# Patient Record
Sex: Female | Born: 1940 | Race: White | Hispanic: No | Smoking: Current every day smoker
Health system: Southern US, Community
[De-identification: ages and names within clinical notes are randomized; demographics above are authoritative.]

## PROBLEM LIST (undated history)

## (undated) DIAGNOSIS — N183 Chronic kidney disease, stage 3 unspecified: Secondary | ICD-10-CM

## (undated) DIAGNOSIS — Z720 Tobacco use: Secondary | ICD-10-CM

## (undated) DIAGNOSIS — M1990 Unspecified osteoarthritis, unspecified site: Secondary | ICD-10-CM

## (undated) DIAGNOSIS — R001 Bradycardia, unspecified: Secondary | ICD-10-CM

## (undated) DIAGNOSIS — Z8659 Personal history of other mental and behavioral disorders: Secondary | ICD-10-CM

## (undated) DIAGNOSIS — F0150 Vascular dementia without behavioral disturbance: Secondary | ICD-10-CM

## (undated) DIAGNOSIS — Z9889 Other specified postprocedural states: Secondary | ICD-10-CM

## (undated) DIAGNOSIS — I639 Cerebral infarction, unspecified: Secondary | ICD-10-CM

## (undated) DIAGNOSIS — I10 Essential (primary) hypertension: Secondary | ICD-10-CM

## (undated) HISTORY — PX: APPENDECTOMY: SHX54

## (undated) HISTORY — DX: Cerebral infarction, unspecified: I63.9

## (undated) HISTORY — DX: Essential (primary) hypertension: I10

## (undated) HISTORY — DX: Tobacco use: Z72.0

## (undated) HISTORY — DX: Bradycardia, unspecified: R00.1

## (undated) HISTORY — DX: Personal history of other mental and behavioral disorders: Z86.59

## (undated) HISTORY — DX: Vascular dementia, unspecified severity, without behavioral disturbance, psychotic disturbance, mood disturbance, and anxiety: F01.50

## (undated) HISTORY — PX: TONSILLECTOMY: SUR1361

## (undated) HISTORY — PX: ABDOMINAL HYSTERECTOMY: SHX81

## (undated) HISTORY — DX: Other specified postprocedural states: Z98.890

---

## 1993-09-11 DIAGNOSIS — I639 Cerebral infarction, unspecified: Secondary | ICD-10-CM

## 1993-09-11 HISTORY — DX: Cerebral infarction, unspecified: I63.9

## 1999-09-12 DIAGNOSIS — Z9889 Other specified postprocedural states: Secondary | ICD-10-CM

## 1999-09-12 HISTORY — DX: Other specified postprocedural states: Z98.890

## 2007-09-11 ENCOUNTER — Ambulatory Visit: Payer: Self-pay | Admitting: Family Medicine

## 2007-11-04 ENCOUNTER — Emergency Department: Payer: Self-pay | Admitting: Emergency Medicine

## 2012-07-31 ENCOUNTER — Observation Stay: Payer: Self-pay | Admitting: Internal Medicine

## 2012-07-31 LAB — PROTIME-INR
INR: 0.9
Prothrombin Time: 12.5 secs (ref 11.5–14.7)

## 2012-07-31 LAB — URINALYSIS, COMPLETE
Bilirubin,UR: NEGATIVE
Blood: NEGATIVE
Glucose,UR: NEGATIVE mg/dL (ref 0–75)
Nitrite: NEGATIVE
RBC,UR: 4 /HPF (ref 0–5)
Squamous Epithelial: 1
WBC UR: 1 /HPF (ref 0–5)

## 2012-07-31 LAB — COMPREHENSIVE METABOLIC PANEL
Albumin: 4 g/dL (ref 3.4–5.0)
Anion Gap: 7 (ref 7–16)
BUN: 36 mg/dL — ABNORMAL HIGH (ref 7–18)
Chloride: 110 mmol/L — ABNORMAL HIGH (ref 98–107)
Co2: 23 mmol/L (ref 21–32)
Creatinine: 1.87 mg/dL — ABNORMAL HIGH (ref 0.60–1.30)
Potassium: 4.7 mmol/L (ref 3.5–5.1)
SGOT(AST): 29 U/L (ref 15–37)
Total Protein: 7.2 g/dL (ref 6.4–8.2)

## 2012-07-31 LAB — CBC
HCT: 40.4 % (ref 35.0–47.0)
HGB: 13.8 g/dL (ref 12.0–16.0)
MCH: 32.8 pg (ref 26.0–34.0)
Platelet: 166 10*3/uL (ref 150–440)
RBC: 4.2 10*6/uL (ref 3.80–5.20)

## 2012-07-31 LAB — TSH: Thyroid Stimulating Horm: 1.86 u[IU]/mL

## 2012-07-31 LAB — CK TOTAL AND CKMB (NOT AT ARMC): CK, Total: 88 U/L (ref 21–215)

## 2012-07-31 LAB — MAGNESIUM: Magnesium: 2.2 mg/dL

## 2012-08-01 DIAGNOSIS — I498 Other specified cardiac arrhythmias: Secondary | ICD-10-CM

## 2012-08-01 DIAGNOSIS — I059 Rheumatic mitral valve disease, unspecified: Secondary | ICD-10-CM

## 2012-08-01 LAB — BASIC METABOLIC PANEL
Anion Gap: 8 (ref 7–16)
BUN: 29 mg/dL — ABNORMAL HIGH (ref 7–18)
Creatinine: 1.5 mg/dL — ABNORMAL HIGH (ref 0.60–1.30)
EGFR (African American): 40 — ABNORMAL LOW
EGFR (Non-African Amer.): 35 — ABNORMAL LOW
Glucose: 94 mg/dL (ref 65–99)
Sodium: 140 mmol/L (ref 136–145)

## 2012-08-01 LAB — CBC WITH DIFFERENTIAL/PLATELET
Basophil %: 0.6 %
Eosinophil #: 0.2 10*3/uL (ref 0.0–0.7)
HCT: 39.3 % (ref 35.0–47.0)
HGB: 13.5 g/dL (ref 12.0–16.0)
Lymphocyte #: 1.4 10*3/uL (ref 1.0–3.6)
Monocyte #: 0.5 x10 3/mm (ref 0.2–0.9)
Monocyte %: 6.3 %
Neutrophil #: 5.3 10*3/uL (ref 1.4–6.5)
Neutrophil %: 71 %
Platelet: 160 10*3/uL (ref 150–440)
RBC: 4.1 10*6/uL (ref 3.80–5.20)
RDW: 13.1 % (ref 11.5–14.5)
WBC: 7.4 10*3/uL (ref 3.6–11.0)

## 2012-08-06 ENCOUNTER — Ambulatory Visit (INDEPENDENT_AMBULATORY_CARE_PROVIDER_SITE_OTHER): Payer: Medicare Other | Admitting: Internal Medicine

## 2012-08-06 ENCOUNTER — Encounter: Payer: Self-pay | Admitting: Internal Medicine

## 2012-08-06 VITALS — BP 100/64 | HR 39 | Ht 61.0 in | Wt 114.0 lb

## 2012-08-06 DIAGNOSIS — I639 Cerebral infarction, unspecified: Secondary | ICD-10-CM | POA: Insufficient documentation

## 2012-08-06 DIAGNOSIS — R001 Bradycardia, unspecified: Secondary | ICD-10-CM

## 2012-08-06 DIAGNOSIS — I498 Other specified cardiac arrhythmias: Secondary | ICD-10-CM

## 2012-08-06 DIAGNOSIS — I6350 Cerebral infarction due to unspecified occlusion or stenosis of unspecified cerebral artery: Secondary | ICD-10-CM

## 2012-08-06 DIAGNOSIS — I10 Essential (primary) hypertension: Secondary | ICD-10-CM

## 2012-08-06 NOTE — Progress Notes (Signed)
Patient Care Team: Provider Not In System as PCP - General   HPI  Joanna Stone is a 71 y.o. female Seen for consideration for pacemaker implantation  She was seen in hospital with HR 25-35  Echo >>normal LV function  Long term smoker.  wheelchair bound following a stroke. She is nonambulatory. She has had one episode of syncope where she fell out of the wheelchair a couple years ago Thanksgiving. Review of old records demonstrates that 2009 on arrival by EMS she had a reported heart rate of 50.  She denies chest pain. She has no known coronary artery disease. She is a long-standing smoker as noted but does not have diabetes. She does have hypertension  Past Medical History  Diagnosis Date  . 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 History of depression   . Hypertension   . History of brain surgery 2001  . Vascular dementia   . Tobacco abuse   . Chronic kidney disease     acute on chronic kidney disease  . Cerebrovascular accident   . sinus node dysfunction]     competing junctional rhythm    Past Surgical History  Procedure Date  . Abdominal hysterectomy   . Tonsillectomy   . Appendectomy     Current Outpatient Prescriptions  Medication Sig Dispense Refill  . acetaminophen (MAPAP) 500 MG tablet Take two tablets every 12 hours.      Marland Kitchen amLODipine (NORVASC) 10 MG tablet Take 10 mg by mouth daily.      Marland Kitchen aspirin 81 MG tablet Take 81 mg by mouth daily.      . Calcium Carbonate-Vitamin D (CALCIUM 600+D) 600-400 MG-UNIT per tablet Take 1 tablet by mouth 2 (two) times daily.      . citalopram (CELEXA) 40 MG tablet Take 40 mg by mouth daily.      Marland Kitchen gabapentin (NEURONTIN) 300 MG capsule Take 600 mg by mouth 3 (three) times daily.       . ranitidine (ZANTAC) 150 MG capsule Take 150 mg by mouth 2 (two) times daily.      . simvastatin (ZOCOR) 20 MG tablet Take 20 mg by mouth every evening.      Marland Kitchen VITAMIN D, CHOLECALCIFEROL, PO Take 400 Units by mouth  daily.        Allergies  Allergen Reactions  . Ultram (Tramadol)     vomiting    Review of Systems negative except from HPI and PMH  Physical Exam BP 100/64  Pulse 39  Ht 5\' 1"  (1.549 m)  Wt 114 lb (51.71 kg)  BMI 21.54 kg/m2 Cachectic and chronically ill-appearing woman appearing much older than her stated age who smells of cigarette smoke  edentulous with dentures  HENT normal  E scleral and icterus clear Neck Supple JVP flat; carotids brisk and full Clear to ausculation Very slow and regular rhythm, no murmurs gallops or rub Soft with active bowel sounds No clubbing cyanosis no Edema Alert and oriented,  she is not felt to be competent to make decisions and is a ward of the county. She is nonambulatory sitting in a wheelchair with contractures in her left arm  Skin Warm and Dry with tattoos  Electrocardiogram demonstrates sinus bradycardia with junctional escape rates in the 29 on November   Assessment and  Plan

## 2012-08-06 NOTE — Patient Instructions (Addendum)

## 2012-08-06 NOTE — Assessment & Plan Note (Signed)
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 She has had syncope not clearly related to her bradycardia. It is most reasonable to implant a pacemaker even though she is nonambulatory as with her last syncopal episode she fell out of her wheelchair. The benefits and risks were reviewed including but not limited to death,  perforation, infection, lead dislodgement and device malfunction.  The patient understands agrees and is willing to proceed.

## 2012-08-07 ENCOUNTER — Other Ambulatory Visit: Payer: Self-pay

## 2012-08-12 ENCOUNTER — Encounter (HOSPITAL_COMMUNITY): Admission: RE | Payer: Self-pay | Source: Ambulatory Visit

## 2012-08-12 ENCOUNTER — Ambulatory Visit (HOSPITAL_COMMUNITY): Admission: RE | Admit: 2012-08-12 | Payer: Medicare Other | Source: Ambulatory Visit | Admitting: Internal Medicine

## 2012-08-12 ENCOUNTER — Telehealth: Payer: Self-pay

## 2012-08-12 SURGERY — PERMANENT PACEMAKER INSERTION
Anesthesia: LOCAL

## 2012-08-12 NOTE — Telephone Encounter (Signed)
Please call regarding pacemaker that was cancelled by Dr. Graciela Husbands. Need to know what to expect regarding her heart rate still running low.

## 2012-08-12 NOTE — Telephone Encounter (Signed)
Please advise thanks.

## 2012-08-12 NOTE — Telephone Encounter (Signed)
I paged Dr. Graciela Husbands, who is scrubbed in at this time, and he explained, through another nurse, that procedure was cancelled d/t pt being "asymptomatic" I asked that he call me back once he is done with case so I can get some instruction for [facility] and new PPM implant date

## 2012-08-12 NOTE — Telephone Encounter (Signed)
I received call back from [place], pt's caregiver, who says she spoke to social worker this am and was told she spoke with a "debra" at [place] office last week, who spoke with Dr. Graciela Husbands, and decided to cancel procedure d/t pt "not being symptomatic enough" Leanne Chang took pt to PCP this am and PCP wants pt seen by [place] again to get specific parameters re: pt symptoms and when to be concerned I told Leanne Chang I would have to discuss further with Dr. Graciela Husbands and will call her back Understanding verb

## 2012-08-12 NOTE — Telephone Encounter (Signed)
Pt's caregiver, Leanne Chang, says they received t/c from social worker on Wednesday 08/07/12 saying they had talked with Dr. Graciela Husbands and he discussed with "another doctor" and decided "since patient was not symptomatic", the pacemaker implant would be cancelled.   I do not have any documentation of a nurse speaking with anyone re: this patient on Wednesday I told Leanne Chang I would page Dr. Graciela Husbands and try to find answer and call her back Understanding verb

## 2012-08-14 NOTE — Telephone Encounter (Signed)
Who is PCP and would be glad to call   Thanks steve

## 2012-12-03 ENCOUNTER — Telehealth: Payer: Self-pay | Admitting: Cardiovascular Disease

## 2012-12-03 NOTE — Telephone Encounter (Signed)
Received call from Dr. Cherylann Ratel (renal). Heart rate 33 in the office. Concerned that she did not have a pacer. She is asymptomatic, sits in wheel chair most of the time. Suggested we have periodic follow up with Dr. Graciela Husbands. Can we set up appt for her to see Dr. Graciela Husbands. Thx

## 2012-12-03 NOTE — Telephone Encounter (Signed)
First available

## 2012-12-03 NOTE — Telephone Encounter (Signed)
How soon? No availability until may

## 2012-12-04 NOTE — Telephone Encounter (Signed)
Please schedule with Graciela Husbands in April in [place] thx

## 2012-12-04 NOTE — Telephone Encounter (Signed)
Dr Graciela Husbands is only in [place] one day in April? If the pt is willing to come to [facility] we can see her in April.

## 2012-12-04 NOTE — Telephone Encounter (Signed)
Any available appts with Graciela Husbands in April?

## 2012-12-26 ENCOUNTER — Encounter: Payer: Self-pay | Admitting: Internal Medicine

## 2012-12-26 ENCOUNTER — Ambulatory Visit (INDEPENDENT_AMBULATORY_CARE_PROVIDER_SITE_OTHER): Payer: Medicare Other | Admitting: Internal Medicine

## 2012-12-26 VITALS — BP 110/73 | HR 35 | Ht 61.0 in | Wt 114.0 lb

## 2012-12-26 DIAGNOSIS — I498 Other specified cardiac arrhythmias: Secondary | ICD-10-CM

## 2012-12-26 DIAGNOSIS — R001 Bradycardia, unspecified: Secondary | ICD-10-CM

## 2012-12-26 NOTE — Patient Instructions (Signed)
Your physician wants you to follow-up in: 6 months with Dr. Klein. You will receive a reminder letter in the mail two months in advance. If you don't receive a letter, please call our office to schedule the follow-up appointment.  Your physician recommends that you continue on your current medications as directed. Please refer to the Current Medication list given to you today.  

## 2012-12-26 NOTE — Assessment & Plan Note (Signed)
The patient has profound sinus node dysfunction. She does however have no attributed his symptoms as there is no clear indication for pacing. If she were to develop lightheadedness, syncope or increasing weakness attributable to her heart rate slowing pacing would be indicated and if no alternative explanation could be identified pacing would be reasonable. We'll see her again in 6 months.

## 2012-12-26 NOTE — Progress Notes (Signed)
kf Patient Care Team: Provider Not In System as PCP - General   HPI  Joanna Stone is a 72 y.o. female Seen in followup   for sinus node dysfunction seen about a year ago. She has a history of prior stroke hypertension.  She was wheelchair-bound and largely asymptomatic and was decided not to proceed with device implantation  She affirms as is her caregiver that there have been no symptoms of lightheadedness dizziness or syncope. She is wheelchair-bound and there have been no symptoms of exercise intolerance     Past Medical History  Diagnosis Date  . 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 History of depression   . Hypertension   . History of brain surgery 2001  . Vascular dementia   . Tobacco abuse   . Chronic kidney disease     acute on chronic kidney disease  . Cerebrovascular accident   . sinus node dysfunction]     competing junctional rhythm    Past Surgical History  Procedure Laterality Date  . Abdominal hysterectomy    . Tonsillectomy    . Appendectomy      Current Outpatient Prescriptions  Medication Sig Dispense Refill  . acetaminophen (MAPAP) 500 MG tablet Take two tablets every 12 hours.      Marland Kitchen amLODipine (NORVASC) 10 MG tablet Take 10 mg by mouth daily.      Marland Kitchen aspirin 81 MG tablet Take 81 mg by mouth daily.      . Calcium Carbonate-Vitamin D (CALCIUM 600+D) 600-400 MG-UNIT per tablet Take 1 tablet by mouth 2 (two) times daily.      . citalopram (CELEXA) 40 MG tablet Take 40 mg by mouth daily.      Marland Kitchen gabapentin (NEURONTIN) 300 MG capsule Take 600 mg by mouth 3 (three) times daily.       Marland Kitchen lisinopril (PRINIVIL,ZESTRIL) 10 MG tablet Take 10 mg by mouth daily.      Marland Kitchen lovastatin (MEVACOR) 20 MG tablet Take 20 mg by mouth at bedtime.      . ranitidine (ZANTAC) 150 MG capsule Take 150 mg by mouth 2 (two) times daily.      Marland Kitchen VITAMIN D, CHOLECALCIFEROL, PO Take 400 Units by mouth daily.       No current facility-administered medications  for this visit.    Allergies  Allergen Reactions  . Ultram (Tramadol)     vomiting    Review of Systems negative except from HPI and PMH  Physical Exam BP 110/73  Pulse 35  Ht 5\' 1"  (1.549 m)  Wt 114 lb (51.71 kg)  BMI 21.55 kg/m2 Well developed and well nourished in no acute distress Sitting in a wheelchair HEENT normal Neck veins flat Back with significant kyphosis with lungs clear Slow and irregular rhythm with an early systolic murmur Abdomen soft Extremities without edema Neurological alert and conversant and able to move all extremities  ECG demonstrates junctional rhythm at about 35 with some degree of coupling and some sinus beats     Assessment and  Plan

## 2013-07-03 ENCOUNTER — Encounter: Payer: Self-pay | Admitting: Internal Medicine

## 2013-07-03 ENCOUNTER — Ambulatory Visit (INDEPENDENT_AMBULATORY_CARE_PROVIDER_SITE_OTHER): Payer: Medicare Other | Admitting: Internal Medicine

## 2013-07-03 VITALS — BP 129/78 | HR 48 | Ht 61.0 in | Wt 128.0 lb

## 2013-07-03 DIAGNOSIS — I10 Essential (primary) hypertension: Secondary | ICD-10-CM

## 2013-07-03 DIAGNOSIS — I498 Other specified cardiac arrhythmias: Secondary | ICD-10-CM

## 2013-07-03 DIAGNOSIS — R001 Bradycardia, unspecified: Secondary | ICD-10-CM

## 2013-07-03 DIAGNOSIS — R000 Tachycardia, unspecified: Secondary | ICD-10-CM

## 2013-07-03 NOTE — Assessment & Plan Note (Signed)
We'll stop her amlodipine and this can infrequently affect heart rates. If she needs alternative medications Prostin diuretic would be appropriate

## 2013-07-03 NOTE — Patient Instructions (Signed)
Your physician has recommended you make the following change in your medication:  1) Stop amlodipine.  Dr. Graciela Husbands will see you back on an as needed basis.

## 2013-07-03 NOTE — Assessment & Plan Note (Signed)
Heart rates in the 40s. She remains asymptomatic we will withhold for pacing

## 2013-07-03 NOTE — Progress Notes (Signed)
[email or web address]      Patient Care Team: Provider Not In System as PCP - General   HPI  Joanna Stone is a 72 y.o. female Seen in followup for bradycardia.  Last year we have considered undertaking pacemaker implantation. However, not withstanding the degree of bradycardia especially as she is nonambulatory wheelchair-bound  Most recently heart rates have been in the 40s.  Past Medical History  Diagnosis Date  . 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 History of depression   . Hypertension   . History of brain surgery 2001  . Vascular dementia   . Tobacco abuse   . Chronic kidney disease     acute on chronic kidney disease  . Cerebrovascular accident   . sinus node dysfunction]     competing junctional rhythm    Past Surgical History  Procedure Laterality Date  . Abdominal hysterectomy    . Tonsillectomy    . Appendectomy      Current Outpatient Prescriptions  Medication Sig Dispense Refill  . acetaminophen (MAPAP) 500 MG tablet Take two tablets every 12 hours.      Marland Kitchen amLODipine (NORVASC) 10 MG tablet Take 10 mg by mouth daily.      Marland Kitchen aspirin 81 MG tablet Take 81 mg by mouth daily.      . Calcium Carbonate-Vitamin D (CALCIUM 600+D) 600-400 MG-UNIT per tablet Take 1 tablet by mouth 2 (two) times daily.      . citalopram (CELEXA) 40 MG tablet Take 40 mg by mouth daily.      Marland Kitchen gabapentin (NEURONTIN) 300 MG capsule Take 600 mg by mouth 3 (three) times daily.       Marland Kitchen lisinopril (PRINIVIL,ZESTRIL) 10 MG tablet Take 10 mg by mouth daily.      Marland Kitchen lovastatin (MEVACOR) 20 MG tablet Take 20 mg by mouth at bedtime.      . ranitidine (ZANTAC) 150 MG capsule Take 150 mg by mouth 2 (two) times daily.      Marland Kitchen VITAMIN D, CHOLECALCIFEROL, PO Take 400 Units by mouth daily.       No current facility-administered medications for this visit.    Allergies  Allergen Reactions  . Ultram [Tramadol]     vomiting    Review of Systems negative except from HPI and  PMH  Physical Exam BP 129/78  Pulse 48  Ht 5\' 1"  (1.549 m)  Wt 128 lb (58.06 kg)  BMI 24.2 kg/m2 Well developed and cachectic sitting in a wheelchair Breath sounds decreased Regular rate and rhythm albeit slow Soft with active bowel sounds No edema Left-sided weakness Smells of cigarettes  ECG demonstrates a narrow QRS rhythm at 48 without evidence of antecedent P waves.  Assessment and  Plan

## 2014-05-27 ENCOUNTER — Telehealth: Payer: Self-pay

## 2014-05-27 NOTE — Telephone Encounter (Signed)
Spoke with nurse  She wanted to confirm that patient was no longer on amlodipine  I verified with her   Instructed her to make appt for patient if her blood pressure remains elevated

## 2014-05-27 NOTE — Telephone Encounter (Signed)
Nurse with Joanna Stone at group home and  has question regarding BP medication, states BP today was 164/83

## 2014-07-16 ENCOUNTER — Ambulatory Visit: Payer: Self-pay | Admitting: Family Medicine

## 2014-08-12 ENCOUNTER — Ambulatory Visit (INDEPENDENT_AMBULATORY_CARE_PROVIDER_SITE_OTHER): Payer: Medicare Other | Admitting: Internal Medicine

## 2014-08-12 ENCOUNTER — Encounter: Payer: Self-pay | Admitting: Internal Medicine

## 2014-08-12 VITALS — BP 104/58 | HR 37 | Ht 61.0 in | Wt 128.0 lb

## 2014-08-12 DIAGNOSIS — Z01810 Encounter for preprocedural cardiovascular examination: Secondary | ICD-10-CM

## 2014-08-12 DIAGNOSIS — R55 Syncope and collapse: Secondary | ICD-10-CM

## 2014-08-12 DIAGNOSIS — I495 Sick sinus syndrome: Secondary | ICD-10-CM

## 2014-08-12 DIAGNOSIS — I10 Essential (primary) hypertension: Secondary | ICD-10-CM

## 2014-08-12 NOTE — Patient Instructions (Signed)
Your physician recommends that you continue on your current medications as directed. Please refer to the Current Medication list given to you today.  No follow up is needed at this time with Dr. Klein.  He will see you on an as needed basis. 

## 2014-08-12 NOTE — Progress Notes (Signed)
[email or web address]      Patient Care Team: Provider Not In System as PCP - General   HPI  Joanna Stone is a 73 y.o. female Seen in followup for bradycardia.  Last year we have considered undertaking pacemaker implantation. However, not withstanding the degree of bradycardia especially as she is nonambulatory wheelchair-bound  Most recently heart rates have been in the 30 apparently, she had one episode documented 6029. She has had a syncopal episode. She says this was re-5 years ago and occurred at Thanksgiving. She awakened to find herself on the floor surrounded by EMS. She has had no other syncope or presyncope.  She is referred in anticipation of an EGD. She had a choking episode. A barium pill study was done which showed hang-up at the distal esophagus. There is concern regarding her bradycardia.   I called and spoke with the practitioner to get the aforementioned information.    Past Medical History  Diagnosis Date  . 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 History of depression   . Hypertension   . History of brain surgery 2001  . Vascular dementia   . Tobacco abuse   . Chronic kidney disease     acute on chronic kidney disease  . Cerebrovascular accident   . sinus node dysfunction]     competing junctional rhythm    Past Surgical History  Procedure Laterality Date  . Abdominal hysterectomy    . Tonsillectomy    . Appendectomy      Current Outpatient Prescriptions  Medication Sig Dispense Refill  . acetaminophen (MAPAP) 500 MG tablet Take two tablets every 12 hours.    Marland Kitchen. aspirin 81 MG tablet Take 81 mg by mouth daily.    . Calcium Carbonate-Vitamin D (CALCIUM 600+D) 600-400 MG-UNIT per tablet Take 1 tablet by mouth 2 (two) times daily.    . citalopram (CELEXA) 40 MG tablet Take 40 mg by mouth daily.    Marland Kitchen. gabapentin (NEURONTIN) 300 MG capsule Take 600 mg by mouth 3 (three) times daily.     Marland Kitchen. lisinopril (PRINIVIL,ZESTRIL) 20 MG tablet Take 20 mg  by mouth daily.    Marland Kitchen. LORazepam (ATIVAN) 0.5 MG tablet Take 0.5 mg by mouth every 8 (eight) hours as needed for anxiety.    . lovastatin (MEVACOR) 20 MG tablet Take 20 mg by mouth at bedtime.    . nicotine (NICOTROL) 10 MG inhaler Inhale 1 puff into the lungs as needed for smoking cessation.    Marland Kitchen. omeprazole (PRILOSEC) 20 MG capsule Take 20 mg by mouth daily.    . ranitidine (ZANTAC) 150 MG capsule Take 150 mg by mouth 2 (two) times daily.    Marland Kitchen. VITAMIN D, CHOLECALCIFEROL, PO Take 400 Units by mouth daily.     No current facility-administered medications for this visit.    Allergies  Allergen Reactions  . Ultram [Tramadol]     vomiting    Review of Systems negative except from HPI and PMH  Physical Exam BP 104/58 mmHg  Pulse 37  Ht 5\' 1"  (1.549 m)  Wt 128 lb (58.06 kg)  BMI 24.20 kg/m2 Well developed and cachectic sitting in a wheelchair Breath sounds decreased Irregular rate and rhythm albeit slow Soft with active bowel sounds No edema Left-sided weakness Smells of cigarettes  ECG demonstrates a narrow QRS rhythm at 48 with intermittent evidence of antecedent P waves.  Assessment and  Plan Esophageal stricture?  Bradycardia-sinus  Syncope-remote  Hypertension  Preprocedural clearance  The patient has had long-standing bradycardia dating back, at least as far as her childbearing years, which has been unrelenting and for the most part asymptomatic. Her syncopal episode is unlikely to be primarily arrhythmic given its prolonged duration. It is more likely to be neurally mediated.  While it is possible that her sinus node function has gradually worsened with a reported heart rate at 29, still her symptoms do not trigger a need for pacing  Hence, per guidelines, there is no indication for pacing this asymptomatic patient. Furthermore, she is nonambulatory for the risk of harm with syncope is also diminished. As  As noted above, I have spoken with the provider, and  recommended that the procedure however be done in the context of more intense observation, for example at the hospital.  Furthermore, in the notes hypertension was noted and the question related to amlodipine. I have noted that amlodipine can rarely but reportedly be associated with worsening bradycardia. Hence it should be avoided.

## 2014-08-12 NOTE — Addendum Note (Signed)
Addended by: Baird LyonsPRICE, Palin Tristan L on: 08/12/2014 09:09 AM   Modules accepted: Level of Service

## 2014-08-25 ENCOUNTER — Ambulatory Visit: Payer: Self-pay | Admitting: Gastroenterology

## 2014-12-29 NOTE — Discharge Summary (Signed)
PATIENT NAME:  Joanna Stone, LORAH MR#:  [id number] DATE OF BIRTH:  03/25/1941  DATE OF ADMISSION:  07/31/2012 DATE OF DISCHARGE:  08/01/2012  PRIMARY CARE PHYSICIAN: [facility]  CARDIOLOGIST: Dr. Mariah Milling   FINAL DIAGNOSES:  1. Junctional escape bradycardia, needs to follow up as outpatient for consideration for pacemaker.  2. Hypertension.  3. Acute on chronic kidney disease.  4. Tobacco abuse.  5. History of cerebrovascular accident.  6. Vascular dementia.  7. Hyperlipidemia.   MEDICATIONS ON DISCHARGE:  1. Amlodipine 10 mg daily.  2. Calcium and vitamin D 1 tablet daily.  3. Celexa 40 mg daily.  4. Gabapentin 300 mg 3 times a day.  5. Mapap 500, 2 tablets once a day. 6. Ranitidine 150 mg twice a day. 7. Simvastatin 20 mg at bedtime.  8. Vitamin D3 400 international units daily.  9. Aspirin 81 mg daily.  10. Nicotine inhalation device 1 inhalation every 10 to 30 minutes as needed for nicotine craving.  11. Stop taking Lisinopril.   DIET: Low sodium diet, regular consistency.   ACTIVITY: Activity as tolerated.  FOLLOW UP: Follow up with Dr. Graciela Husbands, cardiology, Tuesday, November 26 at 1:30 p.m., follow up in 1 to 2 weeks with Dr. Vevelyn Royals.   NOTE: Of note, the patient does have a guardian, [name], phone [phone]. Any prior procedures need to be approved by the guardian.    REASON FOR ADMISSION: Patient was admitted as an observation 07/31/2012, discharged 08/01/2012. Sent in from kidney doctor for low heart rate.   HISTORY OF PRESENT ILLNESS: 74 year old female with CVA, left-sided weakness, depression, hypertension, chronic kidney disease was noted to have a low heart rate at the kidney doctor and sent in for further evaluation. Heart rate has been in the 30s. Patient was admitted as an observation. Cardiology consultation was obtained.   LABORATORY, DIAGNOSTIC, AND RADIOLOGICAL DATA: EKG that was sinus bradycardia 31 beats per minute. TSH 1.86. Troponin negative. INR  0.9. White blood cell count 6.2, hemoglobin and hematocrit 13.8 and 40.4, platelet count 166, glucose 96, BUN 36, creatinine 1.87, sodium 140, potassium 4.7, chloride 110, CO2 23, calcium 9.1. Liver function tests normal range. Another EKG showed sinus bradycardia 29 beats per minute. Chest x-ray showed no acute cardiopulmonary disease. Urinalysis showed trace leukocyte esterase, otherwise negative. Repeat creatinine 1.5. Echocardiogram showed bradycardia noted within the rates in the high 30s. Left ventricular systolic function normal. Ejection fraction greater than 55%. Impaired left ventricular relaxation. Right ventricular systolic function normal.   HOSPITAL COURSE PER PROBLEM LIST:  1. For the patient's junctional escape bradycardia, patient was intermittent in sinus bradycardia and junctional rhythm. Dr. Mariah Milling saw in consultation who discussed the case with Dr. Graciela Husbands, EP specialist. Since the patient is asymptomatic at this time it is okay to sent home. They will follow up closely as outpatient for consideration for pacemaker placement. Again this will have to be approved via the guardian. Patient's blood pressure remained stable. Heart rate was as high as 58 on telemetry, as low as the 30s. Blood pressure remained stable.  2. Hypertension. I held the lisinopril and kept the amlodipine going. Blood pressure 135/64 upon discharge  3. Acute on chronic kidney disease. The patient was hydrated overnight and held on the lisinopril. Creatinine improved to 1.5, upon discharge the GFR is still 35 making this chronic kidney disease, stage III.  4. Tobacco abuse. Smoking cessation counseling done, three minutes, during the hospital course. Nicotrol inhaler was prescribed, unclear if she will  take it.  5. History of cerebrovascular accident. Aspirin prescribed.  6. Hyperlipidemia. She is on simvastatin.  7. Vascular dementia. She does have a guardian.   DISPOSITION: Patient was discharged back to be assisted  living facility.  TIME SPENT ON DISCHARGE: 40 minutes.  ____________________________ Herschell Dimesichard J. Renae GlossWieting, MD rjw:cms D: 08/01/2012 14:02:39 ET T: 08/01/2012 14:14:58 ET JOB#: [id number]  cc: Herschell Dimesichard J. Renae GlossWieting, MD, <Dictator> [facility] Antonieta Ibaimothy J. Gollan, MD Dr. Graciela HusbandsKlein at Dr. Windell HummingbirdGollan's office, EP specialist Salley ScarletICHARD J Johathan Province MD ELECTRONICALLY SIGNED 08/12/2012 17:29

## 2014-12-29 NOTE — H&P (Signed)
PATIENT NAME:  Joanna Stone, Joanna Stone MR#:  [id number] DATE OF BIRTH:  [birth year]  DATE OF ADMISSION:  07/31/2012   REFERRING PHYSICIAN: Dr. Enedina Finner   PRIMARY CARE PHYSICIAN: [facility]   CHIEF COMPLAINT: Sent from kidney doctor low heart rate.   HISTORY OF PRESENT ILLNESS: The patient is a pleasant 74 year old Caucasian female with history of CVA x3 with left-sided hemiparesis, depression, hypertension, and likely kidney disease who was being evaluated by her kidney doctor where she goes every three months. The patient was noted to have a low heart rate and, per her, was sent here for further evaluation. The patient has stable blood pressures here. She states that her heart rate has been always low since she was born. However, I cannot find any previous EKGs and when I called [facility] they did not have a record of her. Currently the heart rate is in the 30's. There are two EKGs here which appear to be sinus bradycardia, one has rate of 29 and one has a rate of 31 and appears to be narrow complex. Hospitalist services were contacted for further evaluation and management.   PAST MEDICAL HISTORY:  1. CVA x3 with left-sided hemiparesis in 1995.  2. History of depression. 3. Hypertension. 4. Brain surgery in 2001 for "small head bleed".  5. Hysterectomy. 6. Tonsillectomy appendectomy.   ALLERGIES: Ultram causes GI distress.   FAMILY HISTORY: Pancreatic cancer in her daughter.   OUTPATIENT MEDICATIONS:  1. Amlodipine 10 mg daily.  2. Calcium 600 Plus D 600/200 international units 1 tab once a day. 3. Citalopram 40 mg once a day. 4. Gabapentin 300 mg 3 times a day.  5. Lisinopril 40 mg once a day. 6. Mapap 500 mg 2 tabs once a day.  7. Ranitidine 150 mg 2 times a day.  8. Simvastatin 20 mg once a day. 9. Vitamin D3 400 international units 1 cap once a day.  REVIEW OF SYSTEMS: CONSTITUTIONAL: Denies fever, fatigue, weakness, or weight changes. EYES: No blurry vision or double  vision. ENT: No tinnitus, hearing loss, postnasal drip. RESPIRATORY: No cough, wheezing, hemoptysis, or shortness of breath. CARDIOVASCULAR: No chest pain. History of low heart rate in the past. No dyspnea on exertion. No palpitations or syncope. GI: No nausea, vomiting, diarrhea, abdominal pain, or rectal bleeding. GU: Denies dysuria or hematuria. HEME/LYMPH: Denies anemia or easy bruising. SKIN: Denies any rashes. MUSCULOSKELETAL: Denies arthritis or gout. NEUROLOGIC: History of CVA with left-sided paresis. PSYCH: Anxiety plus depression.   PHYSICAL EXAMINATION:   VITAL SIGNS: Temperature here was 98 on arrival, heart rate 36, while I was in the room was in the 30's, respiratory rate 20, blood pressure 140/48, initial blood pressure 144/55, oxygen sats 98% on room air.   GENERAL: The patient is an elderly Caucasian female laying in bed in no obvious distress, talking in full sentences.   HEENT: Normocephalic, atraumatic. Pupils are equal and reactive. Anicteric sclerae. Moist mucous membranes.   NECK: Supple. No thyroid tenderness. No cervical lymphadenopathy.   CARDIOVASCULAR: S1, S2, bradycardic. No significant murmurs appreciated.   LUNGS: Clear to auscultation without wheezing or rhonchi.   ABDOMEN: Soft, nontender, nondistended. Positive bowel sounds in all quadrants.   EXTREMITIES: No significant lower extremity edema. Left hand is held in a fist formation contraction and left hand and left foot.   NEUROLOGIC: Cranial nerves II through XII appear to be grossly intact. Strength 5 out of 5 in the right upper and lower extremities, 4+/5 in the left  lower extremity, 2/5 in the left upper extremity.   PSYCH: Awake, alert, oriented x3. Cooperative, conversant.   LABORATORY, DIAGNOSTIC, AND RADIOLOGICAL DATA: Glucose 96, BUN 36, creatinine 1.87, was 1.62 in 2009, sodium 140, potassium 4.7, chloride 110. LFTs within normal limits. Troponin negative x1. TSH 1.86. WBC 6.2, hemoglobin 13.8,  platelets 166. INR is 0.9.   EKG appears to be sinus bradycardia, rate 29 on the EKG with no acute ST elevations or depression. Another EKG again marked sinus bradycardia, rate 31. No acute ST elevations or depressions.  X-ray of the chest, one view, no acute cardiopulmonary disease.   ASSESSMENT AND PLAN: We have a 74 year old Caucasian female with history of CVA x3 with left paresis living at assisted living facility, hypertension and depression who presents with marked bradycardia of unknown duration. At this point will admit the patient for observation to telemetry with remote monitoring and monitor her heart rate. I discussed the case with Dr. Mariah MillingGollan from Cardiology. The patient currently is asymptomatic and states that she has had chronic low heart rates but at this point with the heart rates in the low 30's we would observe her more closely. We would obtain an echocardiogram. The patient is not on any blocking agent or beta-blocker. There is no new medication besides Zantac which on rare occasion can also cause arrhythmias. We would hold this for now. There are no previous EKGs in the chart for me to compare. It is unclear if the patient is changing her rhythms. Prior she was in junctional or any other rhythm, currently is in sinus. As she is asymptomatic, would just monitor her for now. In regards to her low GFR, possibly is chronic. She states that she goes to her kidney specialist every three months. Would monitor GFR. Will provide some gentle fluids as well. We would hold lisinopril at this point. Would continue statin for CVA as well as add aspirin as she is not on one.   CODE STATUS: FULL CODE.        TOTAL TIME SPENT: 60 minutes.   ____________________________ Krystal EatonShayiq Lynnzie Blackson, MD sa:drc D: 07/31/2012 16:26:53 ET T: 07/31/2012 16:52:13 ET JOB#: [id number]  cc: Krystal EatonShayiq Maleiya Pergola, MD, <Dictator> [facility]AY[facility] MD ELECTRONICALLY SIGNED 08/22/2012 11:09

## 2014-12-29 NOTE — H&P (Signed)
PATIENT NAME:  Joanna Stone, Joanna Stone MR#:  [id number] DATE OF BIRTH:  1941/07/08  DATE OF ADMISSION:  07/31/2012  ADDENDUM  SOCIAL HISTORY: Currently lives at [facility]. Positive for tobacco, currently smokes 2 to 3 cigarettes a day. Has tried to cut down. No alcohol or drug use. Is mostly wheelchair bound.    ____________________________ Krystal EatonShayiq Atif Chapple, MD sa:cms D: 07/31/2012 16:29:56 ET T: 07/31/2012 16:52:33 ET JOB#: [id number]  cc: Krystal EatonShayiq Zareen Jamison, MD, <Dictator>  Krystal EatonSHAYIQ Betsaida [place] MD ELECTRONICALLY SIGNED 08/22/2012 11:09

## 2014-12-29 NOTE — Consult Note (Signed)
General Aspect 74 year old Caucasian female with history of CVA x3 with left-sided hemiparesis, depression, hypertension, kidney disease, wheel chair bound,  who was being evaluated by her kidney doctor yesterday when she was found to be bradycardic, rates of 30.  Cardiology was consulted for bradycardia.  The patient reports a lifetime of low heart rates. She does not have any details. She does not stand very much, to wash up and go to teh bathrooom. No dizziness/lightheadedness. She is on BP medications.   In teh ER, BP was >623 systolic.  In the ER,   the heart rate was in the 30???s. There are two EKGs which appear to be sinus bradycardia, one has rate of 29 and one has a rate of 31 and appears to be narrow complex. Periods of junctional escape rhythm, rates high 20s to 30s.   PAST MEDICAL HISTORY:  1. CVA x3 with left-sided hemiparesis in 1995.  2. History of depression. 3. Hypertension. 4. Brain surgery in 2001 for "small head bleed".  5. Hysterectomy. 6. Tonsillectomy appendectomy.   ALLERGIES: Ultram causes GI distress.   FAMILY HISTORY: Pancreatic cancer in her daughter.   OUTPATIENT MEDICATIONS:  1. Amlodipine 10 mg daily.  2. Calcium 600 Plus D 600/200 international units 1 tab once a day. 3. Citalopram 40 mg once a day. 4. Gabapentin 300 mg 3 times a day.  5. Lisinopril 40 mg once a day. 6. Mapap 500 mg 2 tabs once a day.  7. Ranitidine 150 mg 2 times a day.  8. Simvastatin 20 mg once a day. 9. Vitamin D3 400 international units 1 cap once a day.    Present Illness . Social: lives at the [facility], non smoker   Physical Exam:   GEN well developed, well nourished, no acute distress, thin    HEENT red conjunctivae    NECK supple  No masses    RESP normal resp effort  clear BS    CARD Regular rate and rhythm  Bradycardic    ABD denies tenderness  soft    LYMPH negative neck    EXTR negative edema    SKIN normal to palpation     NEURO motor/sensory function intact    PSYCH alert, A+O to time, place, person, good insight   Review of Systems:   Subjective/Chief Complaint no symptoms    General: No Complaints    Skin: No Complaints    ENT: No Complaints    Eyes: No Complaints    Neck: No Complaints    Respiratory: No Complaints    Cardiovascular: No Complaints    Gastrointestinal: No Complaints    Genitourinary: No Complaints    Vascular: No Complaints    Musculoskeletal: legs weak    Neurologic: No Complaints    Hematologic: No Complaints    Endocrine: No Complaints    Psychiatric: No Complaints    Review of Systems: All other systems were reviewed and found to be negative    Medications/Allergies Reviewed Medications/Allergies reviewed     cva:    Depression:    htn:    "brain surgery":    hysterectomy:   Home Medications: Medication Instructions Status  simvastatin tablet 20 mg 1 tab(s) orally once a day (at bedtime)  Active  amlodipine 10 mg oral tablet 1 tab(s) orally once a day Active  Calcium 600+D 600 mg-200 intl units oral tablet 1 tab(s) orally once a day Active  citalopram 40 mg oral tablet 1 tab(s) orally  once a day Active  gabapentin 300 mg oral capsule 1 cap(s) orally 3 times a day Active  lisinopril 40 mg oral tablet 1 tab(s) orally once a day Active  Mapap 500 mg oral tablet 2 tab(s) orally once a day Active  ranitidine 150 mg oral capsule 1 cap(s) orally 2 times a day Active  simvastatin 20 mg oral tablet 1 tab(s) orally once a day (at bedtime) Active  Vitamin D3 400 intl units oral capsule 1 cap(s) orally once a day Active   Lab Results:  Routine Chem:  21-Nov-13 03:58    Glucose, Serum 94   BUN  29   Creatinine (comp)  1.50   Sodium, Serum 140   Potassium, Serum 4.6   Chloride, Serum  111   CO2, Serum 21   Calcium (Total), Serum 8.8   Anion Gap 8   Osmolality (calc) 285   eGFR (African American)  40   eGFR (Non-African American)  35 (eGFR values  <12m/min/1.73 m2 may be an indication of chronic kidney disease (CKD). Calculated eGFR is useful in patients with stable renal function. The eGFR calculation will not be reliable in acutely ill patients when serum creatinine is changing rapidly. It is not useful in  patients on dialysis. The eGFR calculation may not be applicable to patients at the low and high extremes of body sizes, pregnant women, and vegetarians.)   Result Comment POTASSIUM - Slight hemolysis, interpret results with  - caution.  Result(s) reported on 01 Aug 2012 at 05:34AM.  Routine Hem:  21-Nov-13 03:58    WBC (CBC) 7.4   RBC (CBC) 4.10   Hemoglobin (CBC) 13.5   Hematocrit (CBC) 39.3   Platelet Count (CBC) 160   MCV 96   MCH 33.0   MCHC 34.4   RDW 13.1   Neutrophil % 71.0   Lymphocyte % 18.8   Monocyte % 6.3   Eosinophil % 3.3   Basophil % 0.6   Neutrophil # 5.3   Lymphocyte # 1.4   Monocyte # 0.5   Eosinophil # 0.2   Basophil # 0.0 (Result(s) reported on 01 Aug 2012 at 05:34AM.)   EKG:   Interpretation complete heart block with Junctional rhythm with rate 30 or less,  periods of sinus bradycardia    Ultram: GI Distress  Vital Signs/Nurse's Notes:  **Vital Signs.:   21-Nov-13 07:52   Temperature Temperature (F) 98.8   Celsius 37.1   Temperature Source oral   Pulse Pulse 41   Respirations Respirations 18   Systolic BP Systolic BP 1502  Diastolic BP (mmHg) Diastolic BP (mmHg) 73   Mean BP 104   Pulse Ox % Pulse Ox % 98   Pulse Ox Activity Level  At rest   Oxygen Delivery Room Air/ 21 %     Impression 74year old Caucasian female with history of CVA x3 with left-sided hemiparesis, depression, hypertension, kidney disease, wheel chair bound,  who was being evaluated by her kidney doctor yesterday when she was found to be bradycardic, rates of 30.  Cardiology was consulted for bradycardia.  1) Arrhythmia/bradycardia Appears to be period heart block or sinus arrest with junctional escape  rhythm. Rates into the high 20s.  She reports being asymptomatic though spend most of her time in a wheelchair. Most concerning would be when she gets up to wash up and the possiblility of near syncope or syncope in an elderly frail woman. --Case discussed with Dr. SVirl Axe He is concerned and feels she  will need a pacer. Concern this might also progress to complete heart block 100% of the time. --She has an appt with Dr. Caryl Comes on Tuesday Nov 26th at 1:30 in [facility] office echo pending  2) HTN: Continue current medications  3) H/O CVA, residual hemiparesis Uses a wheelchair Lives at the [facility]   Electronic Signatures: Ida Rogue (MD)  (Signed 21-Nov-13 10:46)  Authored: General Aspect/Present Illness, History and Physical Exam, Review of System, Past Medical History, Home Medications, Labs, EKG , Allergies, Vital Signs/Nurse's Notes, Impression/Plan   Last Updated: 21-Nov-13 10:46 by Ida Rogue (MD)

## 2015-03-08 ENCOUNTER — Other Ambulatory Visit: Payer: Self-pay

## 2015-03-22 ENCOUNTER — Emergency Department
Admission: EM | Admit: 2015-03-22 | Discharge: 2015-03-22 | Disposition: A | Discharge status: Home / Self Care | Payer: Medicare Other | Attending: Emergency Medicine | Admitting: Emergency Medicine

## 2015-03-22 ENCOUNTER — Encounter: Payer: Self-pay | Admitting: Emergency Medicine

## 2015-03-22 DIAGNOSIS — Y998 Other external cause status: Secondary | ICD-10-CM | POA: Insufficient documentation

## 2015-03-22 DIAGNOSIS — S0101XA Laceration without foreign body of scalp, initial encounter: Secondary | ICD-10-CM | POA: Diagnosis not present

## 2015-03-22 DIAGNOSIS — S0990XA Unspecified injury of head, initial encounter: Secondary | ICD-10-CM | POA: Diagnosis present

## 2015-03-22 DIAGNOSIS — Y9389 Activity, other specified: Secondary | ICD-10-CM | POA: Diagnosis not present

## 2015-03-22 DIAGNOSIS — Y92129 Unspecified place in nursing home as the place of occurrence of the external cause: Secondary | ICD-10-CM | POA: Diagnosis not present

## 2015-03-22 DIAGNOSIS — I129 Hypertensive chronic kidney disease with stage 1 through stage 4 chronic kidney disease, or unspecified chronic kidney disease: Secondary | ICD-10-CM | POA: Insufficient documentation

## 2015-03-22 DIAGNOSIS — W01198A Fall on same level from slipping, tripping and stumbling with subsequent striking against other object, initial encounter: Secondary | ICD-10-CM | POA: Diagnosis not present

## 2015-03-22 DIAGNOSIS — Z720 Tobacco use: Secondary | ICD-10-CM | POA: Diagnosis not present

## 2015-03-22 DIAGNOSIS — N189 Chronic kidney disease, unspecified: Secondary | ICD-10-CM | POA: Insufficient documentation

## 2015-03-22 DIAGNOSIS — F0150 Vascular dementia without behavioral disturbance: Secondary | ICD-10-CM | POA: Diagnosis not present

## 2015-03-22 HISTORY — DX: Unspecified osteoarthritis, unspecified site: M19.90

## 2015-03-22 MED ORDER — NICOTINE 10 MG IN INHA
1.0000 | RESPIRATORY_TRACT | Status: DC | PRN
  Administered 2015-03-22: 1 via RESPIRATORY_TRACT

## 2015-03-22 MED ORDER — NICOTINE 10 MG IN INHA
RESPIRATORY_TRACT | Status: AC
  Administered 2015-03-22: 1 via RESPIRATORY_TRACT
  Filled 2015-03-22: qty 36

## 2015-03-22 NOTE — ED Notes (Signed)
Spoke with staff at facility regarding the discharge status of patient.  Patient is able to be picked up and will not need an ambulance to take her back and will have transportation coming to get her.

## 2015-03-22 NOTE — Discharge Instructions (Signed)

## 2015-03-22 NOTE — ED Notes (Signed)
Pt arrived from [facility] after a fall.  Pt states she was getting into the bathtub and fell and hit the back of her head. Small laceration noted to the back of her head about an inch in size.  Patient reports she did not have LOC.  EMS states heart rate goes from 28-40s.

## 2015-03-22 NOTE — ED Provider Notes (Addendum)
[facility] Emergency Department Provider Note     Time seen: ----------------------------------------- 11:04 AM on 03/22/2015 -----------------------------------------  Level V caveat: Review of systems and history is difficult to obtain due to baseline history of dementia and CVA  I have reviewed the triage vital signs and the nursing notes.   HISTORY  Chief Complaint No chief complaint on file.    HPI Joanna Stone is a 74 y.o. female presents ER after having a fall at [facility]. Patient states she was getting into the bathtub and fell at the back or head. Small laceration was noted back for head is about an inch in size. Patient reports she did not have loss consciousness, EMS reports patient's heart rate is bradycardic which is her baseline. Patient denies any complaints, is only asking to smoke. She doesn't history of dementia.   Past Medical History  Diagnosis Date  . 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 History of depression   . Hypertension   . History of brain surgery 2001  . Vascular dementia   . Tobacco abuse   . Chronic kidney disease     acute on chronic kidney disease  . Cerebrovascular accident   . sinus node dysfunction]     competing junctional rhythm    Patient Active Problem List   Diagnosis Date Noted  . Essential hypertension 07/03/2013  . sinus node dysfunction]   . Cerebrovascular accident     Past Surgical History  Procedure Laterality Date  . Abdominal hysterectomy    . Tonsillectomy    . Appendectomy      Allergies Ultram  Social History History  Substance Use Topics  . Smoking status: Current Every Day Smoker -- 1.00 packs/day for 0 years    Types: Cigarettes  . Smokeless tobacco: Not on file  . Alcohol Use: No    Review of Systems Constitutional: Negative for fever. Eyes: Negative for visual changes. ENT: Negative for sore throat. Cardiovascular:  Negative for chest pain. Respiratory: Negative for shortness of breath. Gastrointestinal: Negative for abdominal pain, vomiting and diarrhea. Genitourinary: Negative for dysuria. Musculoskeletal: Negative for back pain. Skin: Negative for rash. Positive for posterior scalp laceration Neurological: Negative for headaches, focal weakness or numbness.  10-point ROS otherwise negative.  ____________________________________________   PHYSICAL EXAM:  VITAL SIGNS: ED Triage Vitals  Enc Vitals Group     BP --      Pulse --      Resp --      Temp --      Temp src --      SpO2 --      Weight --      Height --      Head Cir --      Peak Flow --      Pain Score --      Pain Loc --      Pain Edu? --      Excl. in GC? --     Constitutional: Alert and oriented. Well appearing and in no distress. Eyes: Conjunctivae are normal. PERRL. Normal extraocular movements. ENT   Head: Positive for midline posterior scalp laceration that is 2.5 cm   Nose: No congestion/rhinnorhea.   Mouth/Throat: Mucous membranes are moist.   Neck: No stridor. Hematological/Lymphatic/Immunilogical: No cervical lymphadenopathy. Cardiovascular: Slow rate, regular rhythm Normal and symmetric distal pulses are present in all extremities. No murmurs, rubs, or gallops. Respiratory: Normal respiratory effort without tachypnea  nor retractions. Breath sounds are clear and equal bilaterally. No wheezes/rales/rhonchi. Gastrointestinal: Soft and nontender. No distention. No abdominal bruits. There is no CVA tenderness. Musculoskeletal: Nontender with normal range of motion in all extremities. No joint effusions.  No lower extremity tenderness nor edema. Neurologic:  Normal speech and language. Left-sided weakness from old CVA is noted. Speech is normal. Skin:  Skin is warm, dry and intact. No rash noted. Psychiatric: Patient repetitively asking to smoke and for me to document that in her  chart.  ____________________________________________  ED COURSE:  Pertinent labs & imaging results that were available during my care of the patient were reviewed by me and considered in my medical decision making (see chart for details). Patient will be observed in the ER, she does not meet criteria for CT head at this time. ____________________________________________  LACERATION REPAIR Performed by: Emily Filbert Authorized by: Daryel November E Consent: Verbal consent obtained. Risks and benefits: risks, benefits and alternatives were discussed Consent given by: patient Patient identity confirmed: provided demographic data Prepped and Draped in normal sterile fashion Wound explored  Laceration Location: Midline posterior scalp  Laceration Length: 2.5 cm  No Foreign Bodies seen or palpated  Irrigation method: A saline soaked gauze  Amount of cleaning: standard  Skin closure: Staples   Number of staples: 2   Technique: Standard interrupted   Patient tolerance: Patient tolerated the procedure well with no immediate complications. ____________________________________________  FINAL ASSESSMENT AND PLAN  Minor head injury with scalp laceration  Plan: Patient with head injury and scalp laceration as dictated above. Good wound closure with hemostasis. No complication. Patient has been observed in the ER without any sequela of her head injury, she is repetitively asking to leave and smoke cigarettes. She stable follow up in 10 days for staple removal. We did give her a Nicotrol inhaler while in the ER.   Emily Filbert, MD   Emily Filbert, MD 03/22/15 1118  Emily Filbert, MD 03/22/15 1225

## 2015-11-29 ENCOUNTER — Emergency Department
Admission: EM | Admit: 2015-11-29 | Discharge: 2015-11-30 | Disposition: A | Discharge status: Other Acute Inpatient Hospital | Payer: Medicare Other | Attending: Emergency Medicine | Admitting: Emergency Medicine

## 2015-11-29 ENCOUNTER — Encounter: Payer: Self-pay | Admitting: Emergency Medicine

## 2015-11-29 ENCOUNTER — Emergency Department: Payer: Medicare Other

## 2015-11-29 DIAGNOSIS — Z79899 Other long term (current) drug therapy: Secondary | ICD-10-CM | POA: Insufficient documentation

## 2015-11-29 DIAGNOSIS — S065XAA Traumatic subdural hemorrhage with loss of consciousness status unknown, initial encounter: Secondary | ICD-10-CM

## 2015-11-29 DIAGNOSIS — I159 Secondary hypertension, unspecified: Secondary | ICD-10-CM | POA: Diagnosis not present

## 2015-11-29 DIAGNOSIS — Z7982 Long term (current) use of aspirin: Secondary | ICD-10-CM | POA: Insufficient documentation

## 2015-11-29 DIAGNOSIS — F17210 Nicotine dependence, cigarettes, uncomplicated: Secondary | ICD-10-CM | POA: Insufficient documentation

## 2015-11-29 DIAGNOSIS — S065X9A Traumatic subdural hemorrhage with loss of consciousness of unspecified duration, initial encounter: Secondary | ICD-10-CM

## 2015-11-29 DIAGNOSIS — S065X0A Traumatic subdural hemorrhage without loss of consciousness, initial encounter: Secondary | ICD-10-CM | POA: Diagnosis not present

## 2015-11-29 DIAGNOSIS — Y998 Other external cause status: Secondary | ICD-10-CM | POA: Diagnosis not present

## 2015-11-29 DIAGNOSIS — Y92128 Other place in nursing home as the place of occurrence of the external cause: Secondary | ICD-10-CM | POA: Insufficient documentation

## 2015-11-29 DIAGNOSIS — S01111S Laceration without foreign body of right eyelid and periocular area, sequela: Secondary | ICD-10-CM | POA: Diagnosis not present

## 2015-11-29 DIAGNOSIS — S0990XA Unspecified injury of head, initial encounter: Secondary | ICD-10-CM | POA: Diagnosis present

## 2015-11-29 DIAGNOSIS — W050XXA Fall from non-moving wheelchair, initial encounter: Secondary | ICD-10-CM | POA: Insufficient documentation

## 2015-11-29 DIAGNOSIS — F0390 Unspecified dementia without behavioral disturbance: Secondary | ICD-10-CM | POA: Diagnosis not present

## 2015-11-29 DIAGNOSIS — Y9389 Activity, other specified: Secondary | ICD-10-CM | POA: Insufficient documentation

## 2015-11-29 DIAGNOSIS — N3000 Acute cystitis without hematuria: Secondary | ICD-10-CM | POA: Diagnosis not present

## 2015-11-29 LAB — CBC
HCT: 36.6 % (ref 35.0–47.0)
HEMOGLOBIN: 12.1 g/dL (ref 12.0–16.0)
MCH: 31.3 pg (ref 26.0–34.0)
MCHC: 33 g/dL (ref 32.0–36.0)
MCV: 94.8 fL (ref 80.0–100.0)
PLATELETS: 198 10*3/uL (ref 150–440)
RBC: 3.87 MIL/uL (ref 3.80–5.20)
RDW: 14.2 % (ref 11.5–14.5)
WBC: 6.5 10*3/uL (ref 3.6–11.0)

## 2015-11-29 NOTE — ED Notes (Signed)
Pt had fall out of wheelchair at nursing facility today, denies any pain. Pt has lac and hematoma above right eye. Pt A&O, hx of dementia, pt ask same questions repeatedly . Bleeding controlled. Pt doesn't remember falling

## 2015-11-29 NOTE — ED Notes (Signed)
Pt cleaned up, wet gauze applied to lac at this time

## 2015-11-29 NOTE — ED Notes (Signed)
Pt from [facility] for fall out of wheelchair. Pt has lac above RT eye and hematoma at same location. Pt htn initally at 221/112. PT has hx of dementia and is A&O, pt is at baseline per facility.

## 2015-11-30 DIAGNOSIS — S065X0A Traumatic subdural hemorrhage without loss of consciousness, initial encounter: Secondary | ICD-10-CM | POA: Diagnosis not present

## 2015-11-30 LAB — URINALYSIS COMPLETE WITH MICROSCOPIC (ARMC ONLY)
Bilirubin Urine: NEGATIVE
Glucose, UA: NEGATIVE mg/dL
HGB URINE DIPSTICK: NEGATIVE
Ketones, ur: NEGATIVE mg/dL
NITRITE: POSITIVE — AB
PH: 6 (ref 5.0–8.0)
Protein, ur: 30 mg/dL — AB
SPECIFIC GRAVITY, URINE: 1.012 (ref 1.005–1.030)

## 2015-11-30 LAB — BASIC METABOLIC PANEL
ANION GAP: 5 (ref 5–15)
BUN: 40 mg/dL — ABNORMAL HIGH (ref 6–20)
CHLORIDE: 108 mmol/L (ref 101–111)
CO2: 24 mmol/L (ref 22–32)
Calcium: 8.8 mg/dL — ABNORMAL LOW (ref 8.9–10.3)
Creatinine, Ser: 1.64 mg/dL — ABNORMAL HIGH (ref 0.44–1.00)
GFR calc non Af Amer: 30 mL/min — ABNORMAL LOW (ref >60)
GFR, EST AFRICAN AMERICAN: 34 mL/min — AB (ref >60)
Glucose, Bld: 121 mg/dL — ABNORMAL HIGH (ref 65–99)
POTASSIUM: 4.8 mmol/L (ref 3.5–5.1)
Sodium: 137 mmol/L (ref 135–145)

## 2015-11-30 LAB — TROPONIN I

## 2015-11-30 MED ORDER — LIDOCAINE HCL (PF) 1 % IJ SOLN
5.0000 mL | Freq: Once | INTRAMUSCULAR | Status: AC
  Administered 2015-11-30: 5 mL

## 2015-11-30 MED ORDER — HYDRALAZINE HCL 20 MG/ML IJ SOLN
10.0000 mg | Freq: Once | INTRAMUSCULAR | Status: AC
  Administered 2015-11-30: 10 mg via INTRAVENOUS
  Filled 2015-11-30: qty 1

## 2015-11-30 MED ORDER — CEFTRIAXONE SODIUM 1 G IJ SOLR
1.0000 g | Freq: Once | INTRAMUSCULAR | Status: AC
  Administered 2015-11-30: 1 g via INTRAVENOUS
  Filled 2015-11-30: qty 10

## 2015-11-30 MED ORDER — LIDOCAINE HCL (PF) 1 % IJ SOLN
INTRAMUSCULAR | Status: AC
  Administered 2015-11-30: 5 mL
  Filled 2015-11-30: qty 5

## 2015-11-30 NOTE — ED Notes (Signed)
Grandson at bedside, states he talked to RN at nursing facility and pt had mechanical fall , no LOC noted. Pt still unaware of fall

## 2015-11-30 NOTE — ED Notes (Signed)
Pt grandson, Barbara CowerJason number is [phone]. Wants to be called and updated

## 2015-11-30 NOTE — ED Provider Notes (Signed)
[facility] Emergency Department Provider Note  ____________________________________________  Time seen: Approximately 0003 AM  I have reviewed the triage vital signs and the nursing notes.   HISTORY  Chief Complaint Fall  Patient with history of dementia  HPI Joanna Stone is a 75 y.o. female who comes into the hospital from her nursing facility. The patient was found by a nurse after falling out of her wheelchair. The fall was unwitnessed. The patient reports that she does not remember falling. She denies any nausea, vomiting, dizziness, headache or any other pain. The patient has had strokes before as well as intracranial hemorrhage with previous surgery. The patient has a history of left-sided weakness from her previous strokes. The patient is asking for cigarette. She doesn't know exactly what day it is but she does know that she is in the hospital.   Past Medical History  Diagnosis Date  . 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 History of depression   . Hypertension   . History of brain surgery 2001  . Vascular dementia   . Tobacco abuse   . Chronic kidney disease     acute on chronic kidney disease  . Cerebrovascular accident (HCC)   . sinus node dysfunction]     competing junctional rhythm  . Junctional bradycardia   . Arthritis     Patient Active Problem List   Diagnosis Date Noted  . Essential hypertension 07/03/2013  . sinus node dysfunction]   . Cerebrovascular accident [facility])     Past Surgical History  Procedure Laterality Date  . Abdominal hysterectomy    . Tonsillectomy    . Appendectomy      Current Outpatient Rx  Name  Route  Sig  Dispense  Refill  . acetaminophen (MAPAP) 500 MG tablet      Take two tablets every 12 hours.         Marland Kitchen aspirin 81 MG tablet   Oral   Take 81 mg by mouth daily.         . Calcium Carbonate-Vitamin D (CALCIUM 600+D) 600-400 MG-UNIT per tablet   Oral   Take 1  tablet by mouth 2 (two) times daily.         . citalopram (CELEXA) 40 MG tablet   Oral   Take 40 mg by mouth daily.         Marland Kitchen gabapentin (NEURONTIN) 300 MG capsule   Oral   Take 600 mg by mouth 3 (three) times daily.          Marland Kitchen lisinopril (PRINIVIL,ZESTRIL) 20 MG tablet   Oral   Take 20 mg by mouth daily.         Marland Kitchen lovastatin (MEVACOR) 20 MG tablet   Oral   Take 20 mg by mouth at bedtime.         Marland Kitchen omeprazole (PRILOSEC) 20 MG capsule   Oral   Take 20 mg by mouth daily.         Marland Kitchen VITAMIN D, CHOLECALCIFEROL, PO   Oral   Take 400 Units by mouth daily.           Allergies Ultram  Family History  Problem Relation Age of Onset  . Family history unknown: Yes    Social History Social History  Substance Use Topics  . Smoking status: Current Every Day Smoker -- 1.00 packs/day for 0 years    Types: Cigarettes  . Smokeless tobacco: None  .  Alcohol Use: No    Review of Systems Constitutional: No fever/chills Eyes: No visual changes. ENT: No sore throat. Cardiovascular: Denies chest pain. Respiratory: Denies shortness of breath. Gastrointestinal: No abdominal pain.  No nausea, no vomiting.  No diarrhea.  No constipation. Genitourinary: Negative for dysuria. Musculoskeletal: Negative for back pain. Skin: Negative for rash. Neurological: Negative for headaches, focal weakness or numbness.  10-point ROS otherwise negative.  ____________________________________________   PHYSICAL EXAM:  VITAL SIGNS: ED Triage Vitals  Enc Vitals Group     BP 11/29/15 2247 200/95 mmHg     Pulse Rate 11/29/15 2247 65     Resp 11/29/15 2247 16     Temp 11/29/15 2247 97.6 F (36.4 C)     Temp Source 11/29/15 2247 Oral     SpO2 11/29/15 2247 97 %     Weight --      Height --      Head Cir --      Peak Flow --      Pain Score 11/29/15 2246 0     Pain Loc --      Pain Edu? --      Excl. in GC? --     Constitutional: Alert with some mild disorientation. Well  appearing and in no acute distress. Eyes: Conjunctivae are normal. PERRL. EOMI. Head: Contusion to right eyebrow laceration Nose: No congestion/rhinnorhea. Mouth/Throat: Mucous membranes are moist.  Oropharynx non-erythematous. Cardiovascular: Normal rate, regular rhythm. Grossly normal heart sounds.  Good peripheral circulation. Respiratory: Normal respiratory effort.  No retractions. Lungs CTAB. Gastrointestinal: Soft and nontender. No distention. Positive bowel sounds Musculoskeletal: No lower extremity tenderness nor edema.   Neurologic:  Normal speech and language. Cranial nerves II through XII are grossly intact, patient has some mild left-sided weakness at baseline Skin:  3 cm stellate laceration to above right eyebrow Psychiatric: Mood and affect are normal.   ____________________________________________   LABS (all labs ordered are listed, but only abnormal results are displayed)  Labs Reviewed  BASIC METABOLIC PANEL - Abnormal; Notable for the following:    Glucose, Bld 121 (*)    BUN 40 (*)    Creatinine, Ser 1.64 (*)    Calcium 8.8 (*)    GFR calc non Af Amer 30 (*)    GFR calc Af Amer 34 (*)    All other components within normal limits  URINALYSIS COMPLETEWITH MICROSCOPIC ([facility] ONLY) - Abnormal; Notable for the following:    Color, Urine YELLOW (*)    APPearance HAZY (*)    Protein, ur 30 (*)    Nitrite POSITIVE (*)    Leukocytes, UA 1+ (*)    Bacteria, UA FEW (*)    Squamous Epithelial / LPF 0-5 (*)    All other components within normal limits  URINE CULTURE  CBC  TROPONIN I   ____________________________________________  EKG  ED ECG REPORT I, Rebecka ApleyWebster,  Allison P, the attending physician, personally viewed and interpreted this ECG.   Date: 11/29/2015  EKG Time: 2257  Rate: 64  Rhythm: normal sinus rhythm  Axis: Normal  Intervals:none  ST&T Change: Flipped T waves in leads 1 and aVL as well as in leads V2, V3, V4, V5,  V6  ____________________________________________  RADIOLOGY  CT head and cervical spine: Acute bilateral subdural hematomas, 5 mm on the right and 3 mm on the left, no midline shift, no significant mass effect, generalized atrophy with remote right-sided infarctions, chronic small vessel ischemic disease, negative for acute cervical spine fracture.  ____________________________________________   PROCEDURES  Procedure(s) performed: Please, see procedure note(s).   LACERATION REPAIR Performed by: Rebecka Apley and PA student Authorized by: Lucrezia Europe P Consent: Verbal consent obtained. Risks and benefits: risks, benefits and alternatives were discussed Consent given by: patient Patient identity confirmed: provided demographic data Prepped and Draped in normal sterile fashion Wound explored  Laceration Location: right lateral forehead  Laceration Length: 3 cm  No Foreign Bodies seen or palpated  Anesthesia: local infiltration  Local anesthetic: lidocaine 1 % without epinephrine  Anesthetic total: 2 ml  Irrigation method: syringe Amount of cleaning: standard  Skin closure: 5.0 ethilon  Number of sutures: 8  Technique: simple interrupted  Patient tolerance: Patient tolerated the procedure well with no immediate complications.   Critical Care performed: Yes, see critical care note(s)  CRITICAL CARE Performed by: Lucrezia Europe P   Total critical care time: 30 minutes  Critical care time was exclusive of separately billable procedures and treating other patients.  Critical care was necessary to treat or prevent imminent or life-threatening deterioration.  Critical care was time spent personally by me on the following activities: development of treatment plan with patient and/or surrogate as well as nursing, discussions with consultants, evaluation of patient's response to treatment, examination of patient, obtaining history from patient or  surrogate, ordering and performing treatments and interventions, ordering and review of laboratory studies, ordering and review of radiographic studies, pulse oximetry and re-evaluation of patient's condition.  ____________________________________________   INITIAL IMPRESSION / ASSESSMENT AND PLAN / ED COURSE  Pertinent labs & imaging results that were available during my care of the patient were reviewed by me and considered in my medical decision making (see chart for details).  This is a 75 year old female with a history of dementia who comes in after a fall at her nursing home. The patient has some extremely high blood pressure but it appears as though the patient also has a bilateral subdural hematoma. We initially attempted to contact [facility] but they're currently on diversion. I will goes with the patient's grandson and determine if she will accept being transferred to [facility] or [facility]. The patient will receive a dose of hydralazine 10 mg IV for her elevated blood pressure. It was also noted that the patient has a urinary tract infection. She will receive a dose of ceftriaxone for her urinary tract infection. At this time she is awake and alert she is maintaining her airway and in no acute distress. [facility] is also on diversion and [facility] has no beds available. I did contact [facility] and the patient has been accepted to the emergency department there. I will give her a second dose of hydralazine as her blood pressure is still elevated to the 199/87. ____________________________________________   FINAL CLINICAL IMPRESSION(S) / ED DIAGNOSES  Final diagnoses:  Subdural hematoma (HCC)  Acute cystitis without hematuria  Secondary hypertension, unspecified      Rebecka Apley, MD 11/30/15 [phone]

## 2015-11-30 NOTE — ED Notes (Signed)
Patient transported to CT 

## 2015-11-30 NOTE — ED Notes (Signed)
RN called grandson to update on pt's transfer status

## 2015-12-02 LAB — URINE CULTURE

## 2016-08-20 ENCOUNTER — Emergency Department: Payer: Medicare Other

## 2016-08-20 ENCOUNTER — Emergency Department
Admission: EM | Admit: 2016-08-20 | Discharge: 2016-08-21 | Disposition: A | Discharge status: Home / Self Care | Payer: Medicare Other | Attending: Emergency Medicine | Admitting: Emergency Medicine

## 2016-08-20 DIAGNOSIS — S0990XA Unspecified injury of head, initial encounter: Secondary | ICD-10-CM

## 2016-08-20 DIAGNOSIS — Z7982 Long term (current) use of aspirin: Secondary | ICD-10-CM | POA: Insufficient documentation

## 2016-08-20 DIAGNOSIS — Y9389 Activity, other specified: Secondary | ICD-10-CM | POA: Diagnosis not present

## 2016-08-20 DIAGNOSIS — Z79899 Other long term (current) drug therapy: Secondary | ICD-10-CM | POA: Diagnosis not present

## 2016-08-20 DIAGNOSIS — Y999 Unspecified external cause status: Secondary | ICD-10-CM | POA: Diagnosis not present

## 2016-08-20 DIAGNOSIS — S01112A Laceration without foreign body of left eyelid and periocular area, initial encounter: Secondary | ICD-10-CM | POA: Insufficient documentation

## 2016-08-20 DIAGNOSIS — F17210 Nicotine dependence, cigarettes, uncomplicated: Secondary | ICD-10-CM | POA: Insufficient documentation

## 2016-08-20 DIAGNOSIS — W050XXA Fall from non-moving wheelchair, initial encounter: Secondary | ICD-10-CM | POA: Insufficient documentation

## 2016-08-20 DIAGNOSIS — Y92129 Unspecified place in nursing home as the place of occurrence of the external cause: Secondary | ICD-10-CM | POA: Insufficient documentation

## 2016-08-20 DIAGNOSIS — S0083XA Contusion of other part of head, initial encounter: Secondary | ICD-10-CM

## 2016-08-20 DIAGNOSIS — I129 Hypertensive chronic kidney disease with stage 1 through stage 4 chronic kidney disease, or unspecified chronic kidney disease: Secondary | ICD-10-CM | POA: Diagnosis not present

## 2016-08-20 DIAGNOSIS — W19XXXA Unspecified fall, initial encounter: Secondary | ICD-10-CM

## 2016-08-20 DIAGNOSIS — N189 Chronic kidney disease, unspecified: Secondary | ICD-10-CM | POA: Diagnosis not present

## 2016-08-20 MED ORDER — OXYCODONE-ACETAMINOPHEN 5-325 MG PO TABS
2.0000 | ORAL_TABLET | Freq: Once | ORAL | Status: AC
  Administered 2016-08-20: 2 via ORAL
  Filled 2016-08-20: qty 2

## 2016-08-20 MED ORDER — ONDANSETRON 4 MG PO TBDP
4.0000 mg | ORAL_TABLET | Freq: Once | ORAL | Status: AC
  Administered 2016-08-20: 4 mg via ORAL
  Filled 2016-08-20: qty 1

## 2016-08-20 MED ORDER — OXYCODONE-ACETAMINOPHEN 5-325 MG PO TABS: 2.0000 | ORAL_TABLET | Freq: Four times a day (QID) | ORAL | 0 refills | Status: DC | PRN

## 2016-08-20 NOTE — ED Triage Notes (Signed)
Pt states that she was seated in her wheelchair attempting to go back inside from smoking and hit a bump, pt fell forward out of her chair, pt has a laceration to the her left cheek, pt denies loc, pt has some swelling to her left upper lip, no deformities noted to her teeth, pt states that her heart rate is always low and has been for her entire life

## 2016-08-20 NOTE — ED Notes (Signed)
Pt arrives via ems post fall from wheelchair, pt states that she was attempting to go back inside from smoking in her wheelchair and hit a bump that caused her to fall forward, pt states that she didn't have loc, pt states that she is having pain in her left leg but states that it her normal every night since she had her strokes, pt has an abrasion to the left side of her face with swelling

## 2016-08-20 NOTE — ED Provider Notes (Signed)
[facility] Emergency Department Provider Note        Time seen: ----------------------------------------- 10:08 PM on 08/20/2016 -----------------------------------------    I have reviewed the triage vital signs and the nursing notes.   HISTORY  Chief Complaint Fall    HPI Joanna Stone is a 75 y.o. female who presents to the ER for mechanical fall at her nursing home. Patient states she is not ambulatory but was seated in her wheelchair attempted to go back inside from smoking when she hit a bump and she fell forward out of her chair striking her left cheek and head. Patient was noted to have some swelling to her left side of her face. Patient states her heart rate is always been low in this normal for her. She denies any other complaints other than left facial pain and left leg pain   Past Medical History:  Diagnosis Date  . Arthritis   . Cerebrovascular accident (HCC)   . Chronic kidney disease    acute on chronic kidney disease  . 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 History of brain surgery 2001  . History of depression   . Hypertension   . Junctional bradycardia   . sinus node dysfunction]    competing junctional rhythm  . Tobacco abuse   . Vascular dementia     Patient Active Problem List   Diagnosis Date Noted  . Essential hypertension 07/03/2013  . sinus node dysfunction]   . Cerebrovascular accident [facility](HCC)     Past Surgical History:  Procedure Laterality Date  . ABDOMINAL HYSTERECTOMY    . APPENDECTOMY    . TONSILLECTOMY      Allergies Ultram [tramadol]  Social History Social History  Substance Use Topics  . Smoking status: Current Every Day Smoker    Packs/day: 1.00    Years: 0.00    Types: Cigarettes  . Smokeless tobacco: Not on file  . Alcohol use No    Review of Systems Constitutional: Negative for fever. Cardiovascular: Negative for chest pain. Respiratory: Negative for shortness  of breath. Gastrointestinal: Negative for abdominal pain, vomiting and diarrhea. Genitourinary: Negative for dysuria. Musculoskeletal: Positive for headache, facial pain, left leg pain Skin: Negative for rash. Neurological: Negative for focal weakness or numbness. Chronic paralysis in the left arm and left leg  10-point ROS otherwise negative.  ____________________________________________   PHYSICAL EXAM:  VITAL SIGNS: ED Triage Vitals  Enc Vitals Group     BP 08/20/16 2147 98/62     Pulse Rate 08/20/16 2147 (!) 41     Resp 08/20/16 2147 16     Temp 08/20/16 2147 97.6 F (36.4 C)     Temp Source 08/20/16 2147 Oral     SpO2 08/20/16 2147 100 %     Weight 08/20/16 2155 128 lb (58.1 kg)     Height 08/20/16 2155 5\' 1"  (1.549 m)     Head Circumference --      Peak Flow --      Pain Score 08/20/16 2155 4     Pain Loc --      Pain Edu? --      Excl. in GC? --     Constitutional: Alert and oriented. Well appearing and in no distress. Eyes: Conjunctivae are normal. PERRL. Normal extraocular movements. ENT   Head: Normocephalic, Left facial swelling, ecchymosis and superficial lacerations around the left eye, zygoma, upper lip and chin   Nose: No congestion/rhinnorhea.   Mouth/Throat:  Mucous membranes are moist. No loose teeth, no intraoral blood or lacerations   Neck: No stridor. Cardiovascular: Normal rate, regular rhythm. No murmurs, rubs, or gallops. Respiratory: Normal respiratory effort without tachypnea nor retractions. Breath sounds are clear and equal bilaterally. No wheezes/rales/rhonchi. Gastrointestinal: Soft and nontender. Normal bowel sounds Musculoskeletal: Left thigh tenderness with range of motion Neurologic:  Normal speech and language. Chronic left hemiplegia Skin:  Skin is warm, dry and intact. Superficial lacerations lateral to the left eye, contusions to the left side of her face Psychiatric: Mood and affect are normal. Speech and behavior are  normal.  ____________________________________________  ED COURSE:  Pertinent labs & imaging results that were available during my care of the patient were reviewed by me and considered in my medical decision making (see chart for details). Clinical Course   Patient is in no distress, we will assess with x-rays and CT. Patient will receive oral pain medicine.  Procedures ____________________________________________   RADIOLOGY Images were viewed by me  CT head, maxillofacial, left femur x-rays. IMPRESSION: No acute intracranial abnormality.  Atrophy, chronic microvascular disease. Stable sequela of right-sided cerebral infarctions and left craniotomy.  No evidence of facial fractures.  Left periorbital hematoma. IMPRESSION: No acute fracture or dislocation.  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  ____________________________________________  FINAL ASSESSMENT AND PLAN  Fall, minor head injury, facial contusions  Plan: Patient with imaging as dictated above. Patient is in no distress, I will advise ice, pain medicine and outpatient follow-up. No fractures were concerning injuries are identified at this time.   Emily FilbertWilliams, Jonathan E, MD   Note: This dictation was prepared with Dragon dictation. Any transcriptional errors that result from this process are unintentional    Emily FilbertJonathan E Williams, MD 08/20/16 2307

## 2016-12-08 ENCOUNTER — Inpatient Hospital Stay: Payer: Medicare Other | Admitting: Oncology

## 2016-12-29 ENCOUNTER — Inpatient Hospital Stay: Payer: Medicare Other | Attending: Oncology | Admitting: Oncology

## 2017-01-02 ENCOUNTER — Telehealth: Payer: Self-pay | Admitting: *Deleted

## 2017-01-02 NOTE — Telephone Encounter (Signed)
This info is in regards to pt having a new pt appt and has not made the last 2 appts.  Per [facility] they can handle [facility] issues there at [facility]. We will not r/s her appt

## 2017-01-02 NOTE — Telephone Encounter (Signed)
-----   Message from Marisa Cyphers, [place] sent at 01/01/2017 11:10 AM EDT ----- [facility] 01/01/17 @ 11:10 am and spoke with Leanne Chang.  She advised that Dr. Marland KitchenD" with [facility] making House calls advised her that the patient did not need to be seen and she could manage her anemia in house.  ----- Message ----- From: Corene Cornea, RN Sent: 12/30/2016  11:16 PM To: Marisa Cyphers, NT, Collene Leyden, #  Pt is a resident of [facility] and she is a new pt that has missed her appt x 2. Can you call the care home and see if pt still needs to come and will they bring her.   I am sending to all of you because I do not know who to send it to when all of you have assigned day or do I send it to the person who made the appt or takia because you are on rao team. I did not know what to do.   Thanks and let me know the outcome.

## 2017-06-03 ENCOUNTER — Inpatient Hospital Stay
Admission: EM | Admit: 2017-06-03 | Discharge: 2017-06-08 | DRG: 065 | Disposition: A | Discharge status: Home / Self Care | Payer: Medicare Other | Attending: Internal Medicine | Admitting: Internal Medicine

## 2017-06-03 ENCOUNTER — Emergency Department: Payer: Medicare Other

## 2017-06-03 DIAGNOSIS — I6523 Occlusion and stenosis of bilateral carotid arteries: Secondary | ICD-10-CM | POA: Diagnosis present

## 2017-06-03 DIAGNOSIS — Z7189 Other specified counseling: Secondary | ICD-10-CM | POA: Diagnosis not present

## 2017-06-03 DIAGNOSIS — I69322 Dysarthria following cerebral infarction: Secondary | ICD-10-CM | POA: Diagnosis not present

## 2017-06-03 DIAGNOSIS — I69354 Hemiplegia and hemiparesis following cerebral infarction affecting left non-dominant side: Secondary | ICD-10-CM | POA: Diagnosis not present

## 2017-06-03 DIAGNOSIS — Z515 Encounter for palliative care: Secondary | ICD-10-CM | POA: Diagnosis not present

## 2017-06-03 DIAGNOSIS — Z888 Allergy status to other drugs, medicaments and biological substances status: Secondary | ICD-10-CM

## 2017-06-03 DIAGNOSIS — R4701 Aphasia: Secondary | ICD-10-CM | POA: Diagnosis present

## 2017-06-03 DIAGNOSIS — I495 Sick sinus syndrome: Secondary | ICD-10-CM | POA: Diagnosis present

## 2017-06-03 DIAGNOSIS — G459 Transient cerebral ischemic attack, unspecified: Secondary | ICD-10-CM | POA: Diagnosis not present

## 2017-06-03 DIAGNOSIS — N189 Chronic kidney disease, unspecified: Secondary | ICD-10-CM | POA: Diagnosis not present

## 2017-06-03 DIAGNOSIS — Z993 Dependence on wheelchair: Secondary | ICD-10-CM | POA: Diagnosis not present

## 2017-06-03 DIAGNOSIS — Z79899 Other long term (current) drug therapy: Secondary | ICD-10-CM

## 2017-06-03 DIAGNOSIS — F17210 Nicotine dependence, cigarettes, uncomplicated: Secondary | ICD-10-CM | POA: Diagnosis present

## 2017-06-03 DIAGNOSIS — Z8249 Family history of ischemic heart disease and other diseases of the circulatory system: Secondary | ICD-10-CM | POA: Diagnosis not present

## 2017-06-03 DIAGNOSIS — I471 Supraventricular tachycardia: Secondary | ICD-10-CM | POA: Diagnosis present

## 2017-06-03 DIAGNOSIS — Z90710 Acquired absence of both cervix and uterus: Secondary | ICD-10-CM | POA: Diagnosis not present

## 2017-06-03 DIAGNOSIS — K922 Gastrointestinal hemorrhage, unspecified: Secondary | ICD-10-CM | POA: Diagnosis present

## 2017-06-03 DIAGNOSIS — I129 Hypertensive chronic kidney disease with stage 1 through stage 4 chronic kidney disease, or unspecified chronic kidney disease: Secondary | ICD-10-CM | POA: Diagnosis present

## 2017-06-03 DIAGNOSIS — I6522 Occlusion and stenosis of left carotid artery: Secondary | ICD-10-CM | POA: Diagnosis not present

## 2017-06-03 DIAGNOSIS — Z9049 Acquired absence of other specified parts of digestive tract: Secondary | ICD-10-CM

## 2017-06-03 DIAGNOSIS — I63132 Cerebral infarction due to embolism of left carotid artery: Secondary | ICD-10-CM | POA: Diagnosis not present

## 2017-06-03 DIAGNOSIS — I639 Cerebral infarction, unspecified: Secondary | ICD-10-CM | POA: Diagnosis not present

## 2017-06-03 DIAGNOSIS — I10 Essential (primary) hypertension: Secondary | ICD-10-CM | POA: Diagnosis not present

## 2017-06-03 DIAGNOSIS — I361 Nonrheumatic tricuspid (valve) insufficiency: Secondary | ICD-10-CM | POA: Diagnosis not present

## 2017-06-03 DIAGNOSIS — Z7982 Long term (current) use of aspirin: Secondary | ICD-10-CM | POA: Diagnosis not present

## 2017-06-03 DIAGNOSIS — K920 Hematemesis: Secondary | ICD-10-CM | POA: Diagnosis not present

## 2017-06-03 DIAGNOSIS — I4719 Other supraventricular tachycardia: Secondary | ICD-10-CM

## 2017-06-03 DIAGNOSIS — R001 Bradycardia, unspecified: Secondary | ICD-10-CM | POA: Diagnosis present

## 2017-06-03 DIAGNOSIS — F0150 Vascular dementia without behavioral disturbance: Secondary | ICD-10-CM | POA: Diagnosis present

## 2017-06-03 DIAGNOSIS — N184 Chronic kidney disease, stage 4 (severe): Secondary | ICD-10-CM | POA: Diagnosis present

## 2017-06-03 DIAGNOSIS — I371 Nonrheumatic pulmonary valve insufficiency: Secondary | ICD-10-CM | POA: Diagnosis not present

## 2017-06-03 DIAGNOSIS — Z9889 Other specified postprocedural states: Secondary | ICD-10-CM | POA: Diagnosis not present

## 2017-06-03 DIAGNOSIS — R29706 NIHSS score 6: Secondary | ICD-10-CM | POA: Diagnosis present

## 2017-06-03 DIAGNOSIS — I498 Other specified cardiac arrhythmias: Secondary | ICD-10-CM | POA: Diagnosis not present

## 2017-06-03 HISTORY — DX: Chronic kidney disease, stage 3 (moderate): N18.3

## 2017-06-03 HISTORY — DX: Chronic kidney disease, stage 3 unspecified: N18.30

## 2017-06-03 LAB — DIFFERENTIAL
Basophils Absolute: 0 10*3/uL (ref 0–0.1)
Basophils Relative: 1 %
EOS PCT: 1 %
Eosinophils Absolute: 0.1 10*3/uL (ref 0–0.7)
LYMPHS ABS: 1.2 10*3/uL (ref 1.0–3.6)
LYMPHS PCT: 16 %
Monocytes Absolute: 0.5 10*3/uL (ref 0.2–0.9)
Monocytes Relative: 6 %
NEUTROS ABS: 5.5 10*3/uL (ref 1.4–6.5)
NEUTROS PCT: 76 %

## 2017-06-03 LAB — COMPREHENSIVE METABOLIC PANEL
ALBUMIN: 3.4 g/dL — AB (ref 3.5–5.0)
ALK PHOS: 53 U/L (ref 38–126)
ALT: 16 U/L (ref 14–54)
AST: 23 U/L (ref 15–41)
Anion gap: 8 (ref 5–15)
BILIRUBIN TOTAL: 0.4 mg/dL (ref 0.3–1.2)
BUN: 37 mg/dL — AB (ref 6–20)
CALCIUM: 9 mg/dL (ref 8.9–10.3)
CO2: 23 mmol/L (ref 22–32)
CREATININE: 1.65 mg/dL — AB (ref 0.44–1.00)
Chloride: 108 mmol/L (ref 101–111)
GFR calc Af Amer: 34 mL/min — ABNORMAL LOW (ref >60)
GFR calc non Af Amer: 29 mL/min — ABNORMAL LOW (ref >60)
GLUCOSE: 104 mg/dL — AB (ref 65–99)
Potassium: 4.8 mmol/L (ref 3.5–5.1)
SODIUM: 139 mmol/L (ref 135–145)
TOTAL PROTEIN: 5.9 g/dL — AB (ref 6.5–8.1)

## 2017-06-03 LAB — URINALYSIS, ROUTINE W REFLEX MICROSCOPIC
Bilirubin Urine: NEGATIVE
Glucose, UA: NEGATIVE mg/dL
Hgb urine dipstick: NEGATIVE
Ketones, ur: NEGATIVE mg/dL
NITRITE: NEGATIVE
PH: 5.5 (ref 5.0–8.0)
Protein, ur: NEGATIVE mg/dL
SPECIFIC GRAVITY, URINE: 1.015 (ref 1.005–1.030)

## 2017-06-03 LAB — ETHANOL: Alcohol, Ethyl (B): <5 mg/dL (ref <5)

## 2017-06-03 LAB — CBC
HCT: 36.6 % (ref 35.0–47.0)
Hemoglobin: 12.5 g/dL (ref 12.0–16.0)
MCH: 33 pg (ref 26.0–34.0)
MCHC: 34.2 g/dL (ref 32.0–36.0)
MCV: 96.5 fL (ref 80.0–100.0)
Platelets: 190 10*3/uL (ref 150–440)
RBC: 3.79 MIL/uL — ABNORMAL LOW (ref 3.80–5.20)
RDW: 13.5 % (ref 11.5–14.5)
WBC: 7.2 10*3/uL (ref 3.6–11.0)

## 2017-06-03 LAB — APTT: aPTT: 30 seconds (ref 24–36)

## 2017-06-03 LAB — TROPONIN I: Troponin I: <0.03 ng/mL (ref <0.03)

## 2017-06-03 LAB — PROTIME-INR
INR: 0.95
PROTHROMBIN TIME: 12.6 s (ref 11.4–15.2)

## 2017-06-03 LAB — GLUCOSE, CAPILLARY: Glucose-Capillary: 97 mg/dL (ref 65–99)

## 2017-06-03 MED ORDER — POLYETHYLENE GLYCOL 3350 17 G PO PACK
17.0000 g | PACK | Freq: Every day | ORAL | Status: DC
  Administered 2017-06-05 – 2017-06-08 (×2): 17 g via ORAL
  Filled 2017-06-03 (×5): qty 1

## 2017-06-03 MED ORDER — ENALAPRIL MALEATE 10 MG PO TABS
10.0000 mg | ORAL_TABLET | Freq: Every day | ORAL | Status: DC
  Administered 2017-06-04 – 2017-06-06 (×3): 10 mg via ORAL
  Filled 2017-06-03 (×3): qty 1

## 2017-06-03 MED ORDER — ENOXAPARIN SODIUM 30 MG/0.3ML ~~LOC~~ SOLN
30.0000 mg | SUBCUTANEOUS | Status: DC
  Administered 2017-06-03 – 2017-06-06 (×4): 30 mg via SUBCUTANEOUS
  Filled 2017-06-03 (×6): qty 0.3

## 2017-06-03 MED ORDER — ATROPINE SULFATE 1 MG/10ML IJ SOSY
0.5000 mg | PREFILLED_SYRINGE | INTRAMUSCULAR | Status: DC | PRN
  Administered 2017-06-04: 0.5 mg via INTRAVENOUS
  Filled 2017-06-03: qty 10

## 2017-06-03 MED ORDER — ATROPINE SULFATE 1 MG/10ML IJ SOSY
PREFILLED_SYRINGE | INTRAMUSCULAR | Status: AC
  Filled 2017-06-03: qty 10

## 2017-06-03 MED ORDER — LORATADINE 10 MG PO TABS
10.0000 mg | ORAL_TABLET | Freq: Every day | ORAL | Status: DC
  Administered 2017-06-04 – 2017-06-08 (×5): 10 mg via ORAL
  Filled 2017-06-03 (×5): qty 1

## 2017-06-03 MED ORDER — CITALOPRAM HYDROBROMIDE 20 MG PO TABS
40.0000 mg | ORAL_TABLET | Freq: Every day | ORAL | Status: DC
  Administered 2017-06-04 – 2017-06-08 (×5): 40 mg via ORAL
  Filled 2017-06-03 (×5): qty 2

## 2017-06-03 MED ORDER — PANTOPRAZOLE SODIUM 20 MG PO TBEC
20.0000 mg | DELAYED_RELEASE_TABLET | Freq: Every day | ORAL | Status: DC
  Filled 2017-06-03: qty 1

## 2017-06-03 MED ORDER — HYDRALAZINE HCL 20 MG/ML IJ SOLN
INTRAMUSCULAR | Status: AC
  Administered 2017-06-03: 10 mg via INTRAVENOUS
  Filled 2017-06-03: qty 1

## 2017-06-03 MED ORDER — HYDRALAZINE HCL 50 MG PO TABS
50.0000 mg | ORAL_TABLET | Freq: Three times a day (TID) | ORAL | Status: DC
  Administered 2017-06-03 – 2017-06-06 (×8): 50 mg via ORAL
  Filled 2017-06-03 (×8): qty 1

## 2017-06-03 MED ORDER — PRAVASTATIN SODIUM 20 MG PO TABS
20.0000 mg | ORAL_TABLET | Freq: Every day | ORAL | Status: DC
  Administered 2017-06-03: 20 mg via ORAL
  Filled 2017-06-03: qty 1

## 2017-06-03 MED ORDER — ASPIRIN 81 MG PO CHEW
324.0000 mg | CHEWABLE_TABLET | Freq: Once | ORAL | Status: AC
Start: 2017-06-03 — End: 2017-06-03
  Administered 2017-06-03: 324 mg via ORAL
  Filled 2017-06-03: qty 4

## 2017-06-03 MED ORDER — ACETAMINOPHEN 160 MG/5ML PO SOLN
650.0000 mg | ORAL | Status: DC | PRN
  Filled 2017-06-03: qty 20.3

## 2017-06-03 MED ORDER — FERROUS SULFATE 325 (65 FE) MG PO TABS
325.0000 mg | ORAL_TABLET | Freq: Two times a day (BID) | ORAL | Status: DC
  Administered 2017-06-04 – 2017-06-08 (×9): 325 mg via ORAL
  Filled 2017-06-03 (×9): qty 1

## 2017-06-03 MED ORDER — ASPIRIN EC 325 MG PO TBEC
325.0000 mg | DELAYED_RELEASE_TABLET | Freq: Every day | ORAL | Status: DC
  Administered 2017-06-04: 325 mg via ORAL
  Filled 2017-06-03: qty 1

## 2017-06-03 MED ORDER — GABAPENTIN 300 MG PO CAPS
600.0000 mg | ORAL_CAPSULE | Freq: Three times a day (TID) | ORAL | Status: DC
  Administered 2017-06-03 – 2017-06-06 (×8): 600 mg via ORAL
  Filled 2017-06-03 (×8): qty 2

## 2017-06-03 MED ORDER — ACETAMINOPHEN 325 MG PO TABS: 650.0000 mg | ORAL_TABLET | ORAL | Status: DC | PRN

## 2017-06-03 MED ORDER — ACETAMINOPHEN 500 MG PO TABS
1000.0000 mg | ORAL_TABLET | Freq: Two times a day (BID) | ORAL | Status: DC
  Administered 2017-06-03 – 2017-06-08 (×10): 1000 mg via ORAL
  Filled 2017-06-03 (×10): qty 2

## 2017-06-03 MED ORDER — SENNOSIDES-DOCUSATE SODIUM 8.6-50 MG PO TABS: 1.0000 | ORAL_TABLET | Freq: Every evening | ORAL | Status: DC | PRN

## 2017-06-03 MED ORDER — HYDRALAZINE HCL 20 MG/ML IJ SOLN
10.0000 mg | Freq: Four times a day (QID) | INTRAMUSCULAR | Status: DC | PRN
  Administered 2017-06-03 – 2017-06-07 (×2): 10 mg via INTRAVENOUS
  Filled 2017-06-03: qty 1

## 2017-06-03 MED ORDER — STROKE: EARLY STAGES OF RECOVERY BOOK
Freq: Once | Status: AC
  Administered 2017-06-03: 1

## 2017-06-03 MED ORDER — CALCIUM CARBONATE-VITAMIN D 500-200 MG-UNIT PO TABS
1.0000 | ORAL_TABLET | Freq: Every day | ORAL | Status: DC
  Administered 2017-06-04 – 2017-06-08 (×5): 1 via ORAL
  Filled 2017-06-03 (×5): qty 1

## 2017-06-03 MED ORDER — OXYCODONE-ACETAMINOPHEN 5-325 MG PO TABS: 2.0000 | ORAL_TABLET | Freq: Four times a day (QID) | ORAL | Status: DC | PRN

## 2017-06-03 MED ORDER — ACETAMINOPHEN 650 MG RE SUPP: 650.0000 mg | RECTAL | Status: DC | PRN

## 2017-06-03 NOTE — ED Triage Notes (Signed)
Pt came to ED via EMS from [facility]. Per staff, slurred speech starting at 1400 today but reports it has resolved. Pts speech sounds slighted slurred upon arrival, unsure pts baseline. Did have previous stroke with left sided deficits.

## 2017-06-03 NOTE — H&P (Addendum)
[facility] [place] [facility]   PATIENT NAME: Joanna Stone    MR#:  [id number]  DATE OF BIRTH:  05/25/1941  DATE OF ADMISSION:  06/03/2017  PRIMARY CARE PHYSICIAN: Patient, No Pcp Per   REQUESTING/REFERRING PHYSICIAN: Rifenbark  CHIEF COMPLAINT:  Worsening of slurry speech at 1400 today  HISTORY OF PRESENT ILLNESS:  Joanna Stone  is a 76 y.o. 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 CT head with no acute findings. Patient reports that she has chronic dysarthria which has gotten worse today afternoon. Patient also reports chronic bradycardia but she does not know her baseline, her heart rate was found to be as low as 30 in the ED and hospitalist team is called to admit the patient. Patient denies any dizziness or lightheadedness. No family members at bedside  PAST MEDICAL HISTORY:   Past Medical History:  Diagnosis Date  . Arthritis   . Cerebrovascular accident (HCC)   . Chronic kidney disease    acute on chronic kidney disease  . 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 History of brain surgery 2001  . History of depression   . Hypertension   . Junctional bradycardia   . sinus node dysfunction]    competing junctional rhythm  . Tobacco abuse   . Vascular dementia     PAST SURGICAL HISTOIRY:   Past Surgical History:  Procedure Laterality Date  . ABDOMINAL HYSTERECTOMY    . APPENDECTOMY    . TONSILLECTOMY      SOCIAL HISTORY:   Social History  Substance Use Topics  . Smoking status: Current Every Day Smoker    Packs/day: 1.00    Years: 0.00    Types: Cigarettes  . Smokeless tobacco: Not on file  . Alcohol use No    FAMILY HISTORY:   Family History  Problem Relation Age of  Onset  . Family history unknown: Yes  hypertension runs in her family  DRUG ALLERGIES:   Allergies  Allergen Reactions  . Ultram [Tramadol]     vomiting    REVIEW OF SYSTEMS:  CONSTITUTIONAL: No fever, fatigue or weakness.  EYES: No blurred or double vision.  EARS, NOSE, AND THROAT: No tinnitus or ear pain.  RESPIRATORY: No cough, shortness of breath, wheezing or hemoptysis.  CARDIOVASCULAR: No chest pain, orthopnea, edema.  GASTROINTESTINAL: No nausea, vomiting, diarrhea or abdominal pain.  GENITOURINARY: No dysuria, hematuria.  ENDOCRINE: No polyuria, nocturia,  HEMATOLOGY: No anemia, easy bruising or bleeding SKIN: No rash or lesion. MUSCULOSKELETAL: No joint pain or arthritis.   NEUROLOGIC: worsening of her chronic dysarthria, chronic left-sided weakness PSYCHIATRY: No anxiety or depression.   MEDICATIONS AT HOME:   Prior to Admission medications   Medication Sig Start Date End Date Taking? Authorizing Provider  acetaminophen (MAPAP) 500 MG tablet Take 1,000 mg by mouth every 12 (twelve) hours. May take an additional every 6 hours if needed.   Yes [provider]  aspirin 81 MG tablet Take 81 mg by mouth daily.   Yes [provider]  Calcium Carbonate-Vitamin D (CALCIUM 600+D) 600-400 MG-UNIT per tablet Take 1 tablet by mouth daily.    Yes [provider]  citalopram (CELEXA) 40 MG tablet Take 40 mg by mouth daily.   Yes [provider]  enalapril (VASOTEC) 10 MG tablet Take 10 mg by mouth daily.   Yes [provider]  ferrous sulfate 325 (65 FE) MG tablet Take 325 mg by mouth 2 (two) times daily with a meal.   Yes [provider]  fexofenadine (ALLEGRA) 180 MG tablet Take 180 mg by mouth daily.   Yes [provider]  gabapentin (NEURONTIN) 300 MG capsule Take 600 mg by mouth 3 (three) times daily.    Yes [provider]  hydrALAZINE (APRESOLINE) 50 MG tablet Take 50 mg by mouth 3 (three) times daily.    Yes [provider]  lovastatin (MEVACOR) 20 MG tablet Take 20 mg by mouth at bedtime.   Yes [provider]  pantoprazole (PROTONIX) 20 MG tablet Take 20 mg by mouth daily.   Yes [provider]  polyethylene glycol (MIRALAX / GLYCOLAX) packet Take 17 g by mouth daily.   Yes [provider]  oxyCODONE-acetaminophen (PERCOCET) 5-325 MG tablet Take 2 tablets by mouth every 6 (six) hours as needed for moderate pain or severe pain. Patient not taking: Reported on 06/03/2017 08/20/16   Emily Filbert, MD      VITAL SIGNS:  Blood pressure (!) 184/63, pulse (!) 56, temperature 98.2 F (36.8 C), resp. rate 19, weight 58.1 kg (128 lb), SpO2 97 %.  PHYSICAL EXAMINATION:  GENERAL:  76 y.o.-year-old patient lying in the bed with no acute distress.  EYES: Pupils equal, round, reactive to light and accommodation. No scleral icterus. Extraocular muscles intact.  HEENT: Head atraumatic, normocephalic. Oropharynx and nasopharynx clear.  NECK:  Supple, no jugular venous distention. No thyroid enlargement, no tenderness.  LUNGS: Normal breath sounds bilaterally, no wheezing, rales,rhonchi or crepitation. No use of accessory muscles of respiration.  CARDIOVASCULAR: S1, S2 normal. No murmurs, rubs, or gallops.  ABDOMEN: Soft, nontender, nondistended. Bowel sounds present. No organomegaly or mass.  EXTREMITIES: No pedal edema, cyanosis, or clubbing.  NEUROLOGIC: Cranial nerves II through XII are intact. Patient has chronic left-sided weakness and right upper and lower extremity strength 5 out of 5. Sensation intact. Gait not checked.slight worsening of her chronic dysarthria  PSYCHIATRIC: The patient is alert and oriented x 3.  SKIN: No obvious rash, lesion, or ulcer.   LABORATORY PANEL:   CBC  Recent Labs Lab 06/03/17 1706  WBC 7.2  HGB 12.5  HCT 36.6  PLT 190    ------------------------------------------------------------------------------------------------------------------  Chemistries   Recent Labs Lab 06/03/17 1706  NA 139  K 4.8  CL 108  CO2 23  GLUCOSE 104*  BUN 37*  CREATININE 1.65*  CALCIUM 9.0  AST 23  ALT 16  ALKPHOS 53  BILITOT 0.4   ------------------------------------------------------------------------------------------------------------------  Cardiac Enzymes  Recent Labs Lab 06/03/17 1706  TROPONINI <0.03   ------------------------------------------------------------------------------------------------------------------  RADIOLOGY:  Dg Chest Port 1 View  Result Date: 06/03/2017 CLINICAL DATA:  Patient is poor historian at this time. Encounter for bradycardia. Hx HTN, CVA. Current smoker. EXAM: PORTABLE CHEST 1 VIEW COMPARISON:  07/31/2012 FINDINGS: Stable enlarged cardiac silhouette. No pulmonary edema or pleural fluid. No infiltrate. No pneumothorax. No acute osseous abnormality. IMPRESSION: Cardiomegaly without acute findings. Electronically Signed   By: Genevive Bi M.D.   On: 06/03/2017 17:50   Ct Head Code Stroke Wo Contrast  Result Date: 06/03/2017 CLINICAL DATA:  Code stroke.  Slurred speech and left-sided weakness EXAM: CT HEAD WITHOUT CONTRAST TECHNIQUE: Contiguous axial images were obtained from the base of the skull through the vertex without intravenous  contrast. COMPARISON:  08/20/2016 FINDINGS: Brain: Moderate atrophy. Chronic ischemic changes in the deep white matter on the right stable from the prior study. Chronic microvascular ischemia in the cerebral white matter bilaterally. Negative for acute infarct, hemorrhage, or mass lesion. Vascular: Negative for hyperdense vessel. Atherosclerotic calcification in the carotid artery bilaterally Skull: Chronic craniotomy on the left unchanged Sinuses/Orbits: Negative Other: None ASPECTS (Alberta Stroke Program Early CT Score) - Ganglionic level infarction  (caudate, lentiform nuclei, internal capsule, insula, M1-M3 cortex): 7 - Supraganglionic infarction (M4-M6 cortex): 3 Total score (0-10 with 10 being normal): 10 IMPRESSION: 1. No acute intracranial abnormality. 2. Atrophy and extensive chronic ischemic change. 3. ASPECTS is 10 These results were called by telephone at the time of interpretation on 06/03/2017 at 5:15 pm to Dr. Merrily Brittle , who verbally acknowledged these results. Electronically Signed   By: Marlan Palau M.D.   On: 06/03/2017 17:15    EKG:   Orders placed or performed during the hospital encounter of 06/03/17  . ED EKG  . ED EKG  . Repeat EKG  . Repeat EKG    IMPRESSION AND PLAN:   Joanna Stone  is a 76 y.o. 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 CT head with no acute findings. Patient reports that she has chronic dysarthria which has gotten worse today afternoon. Patient also reports chronic bradycardia but she does not know her baseline, her heart rate was found to be as low as 30 in the ED  #symptomatic bradycardia-acute on chronic Admit to telemetry and monitor on telemetry Avoid rate limiting drugs Check TSH ECHO Consult is placed to cardiology-d/w dr.Arida; no intervention recommended at this time other than avoiding rate limiting drugs Atropine 0.5 mg IV as needed for symptomatic bradycardia-if patient is hypotensive Patient reports that she is bradycardic always but she could not recall her baseline heart rate, according to recent cardiology note ,her heart rate was at around 30 at his office  #TIA-worsening of dysarthria CT head is negative Not a candidate for TPA Get complete stroke workup with MRI/MRA of the brain, carotid Dopplers and 2-D echocardiogram PT, OT  evaluation TSH, fasting lipid panel and hemoglobin A1c Neuro checks and neurology consult Patient was on aspirin 81 mg Will increase to aspirin 325 mg enteric-coated by mouth once daily Patient has passed bedside swallow evaluation, provide heart healthy diet  #chronic history of CVA with chronic left-sided weakness and chronic dysarthria Continue aspirin and statin  #Essential hypertension blood pressure is elevated We will allow permissive hypertension while ruling out for stroke Continue her home medications hydralazine and enalapril and titrate as needed    All the records are reviewed and case discussed with ED provider. Management plans discussed with the patient, family and they are in agreement.  CODE STATUS: fc,healthcare power of attorney grandson Barbara Cower; phone number is not available in the chart and patient could not recall his phone number. I was unable to reach patient's legal guardian Tona Sensing via phone, could not leave voicemail either  TOTAL TIME TAKING CARE OF THIS PATIENT: 45 minutes.   Note: This dictation was prepared with Dragon dictation along with smaller phrase technology. Any transcriptional errors that result from this process are unintentional.  Ramonita Lab M.D on 06/03/2017 at 6:28 PM  Between 7am to 6pm -  Pager - [phone]  After 6pm go to [email or web address] - password [email or web address]  [facility]  Office  [phone]  CC: Primary care physician; Patient, No Pcp Per

## 2017-06-03 NOTE — ED Notes (Signed)
CODE  STROKE  CALLED  TO  TAMMY  AT  [facility] 

## 2017-06-03 NOTE — ED Provider Notes (Signed)
[facility] Emergency Department Provider Note  ____________________________________________   First MD Initiated Contact with Patient 06/03/17 1648     (approximate)  I have reviewed the triage vital signs and the nursing notes.   HISTORY  Chief Complaint Code Stroke  Level V exemption history is limited as the patient is an exceptionally poor historian  HPI Joanna Stone is a 76 y.o. female who comes to the emergency department via EMS after sustaining a possible stroke at 2 PM roughly 2-1/2 hours prior to arrival. The patient was at her nursing home when she began to have slurred speech which concerned the staff. She has a remote history of previous stroke with left upper extremity weakness. She currently feels back to her baseline. She has no headache double vision blurred vision chest pain shortness of breath abdominal pain nausea or vomiting.   Past Medical History:  Diagnosis Date  . Arthritis   . Cerebrovascular accident (HCC)   . Chronic kidney disease    acute on chronic kidney disease  . 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 History of brain surgery 2001  . History of depression   . Hypertension   . Junctional bradycardia   . sinus node dysfunction]    competing junctional rhythm  . Tobacco abuse   . Vascular dementia     Patient Active Problem List   Diagnosis Date Noted  . Bradycardia, sinus, persistent, severe 06/03/2017  . Essential hypertension 07/03/2013  . sinus node dysfunction]   . Cerebrovascular accident [facility])     Past Surgical History:  Procedure Laterality Date  . ABDOMINAL HYSTERECTOMY    . APPENDECTOMY    . TONSILLECTOMY      Prior to Admission medications   Medication Sig Start Date End Date Taking? Authorizing Provider  acetaminophen (MAPAP) 500 MG tablet Take 1,000 mg by mouth every 12 (twelve) hours. May take an additional every 6 hours if needed.   Yes [provider]    aspirin 81 MG tablet Take 81 mg by mouth daily.   Yes [provider]  Calcium Carbonate-Vitamin D (CALCIUM 600+D) 600-400 MG-UNIT per tablet Take 1 tablet by mouth daily.    Yes [provider]  citalopram (CELEXA) 40 MG tablet Take 40 mg by mouth daily.   Yes [provider]  enalapril (VASOTEC) 10 MG tablet Take 10 mg by mouth daily.   Yes [provider]  ferrous sulfate 325 (65 FE) MG tablet Take 325 mg by mouth 2 (two) times daily with a meal.   Yes [provider]  fexofenadine (ALLEGRA) 180 MG tablet Take 180 mg by mouth daily.   Yes [provider]  gabapentin (NEURONTIN) 300 MG capsule Take 600 mg by mouth 3 (three) times daily.    Yes [provider]  hydrALAZINE (APRESOLINE) 50 MG tablet Take 50 mg by mouth 3 (three) times daily.   Yes [provider]  lovastatin (MEVACOR) 20 MG tablet Take 20 mg by mouth at bedtime.   Yes [provider]  pantoprazole (PROTONIX) 20 MG tablet Take 20 mg by mouth daily.   Yes [provider]  polyethylene glycol (MIRALAX / GLYCOLAX) packet Take 17 g by mouth daily.   Yes [provider]  oxyCODONE-acetaminophen (PERCOCET) 5-325 MG tablet Take 2 tablets by mouth every 6 (six) hours as needed for moderate pain or severe pain. Patient not taking: Reported on 06/03/2017 08/20/16   Daryel November  E, MD    Allergies Ultram [tramadol]  Family History  Problem Relation Age of Onset  . Family history unknown: Yes    Social History Social History  Substance Use Topics  . Smoking status: Current Every Day Smoker    Packs/day: 1.00    Years: 0.00    Types: Cigarettes  . Smokeless tobacco: Not on file  . Alcohol use No    Review of Systems Constitutional: No fever/chills Eyes: No visual changes. ENT: No sore throat. Cardiovascular: Denies chest pain. Respiratory: Denies shortness of breath. Gastrointestinal: No abdominal pain.  No nausea, no  vomiting.  No diarrhea.  No constipation. Genitourinary: Negative for dysuria. Musculoskeletal: Negative for back pain. Skin: Negative for rash. Neurological: Positive for slurred speech and weakness   ____________________________________________   PHYSICAL EXAM:  VITAL SIGNS: ED Triage Vitals  Enc Vitals Group     BP      Pulse      Resp      Temp      Temp src      SpO2      Weight      Height      Head Circumference      Peak Flow      Pain Score      Pain Loc      Pain Edu?      Excl. in GC?     Constitutional: Slurred speech no acute distress no respiratory distress no diaphoresis Eyes: PERRL EOMI. Head: Atraumatic. Nose: No congestion/rhinnorhea. Mouth/Throat: No trismus Neck: No stridor.   Cardiovascular: Bradycardic and irregular rhythm Respiratory: Normal respiratory effort.  No retractions. Lungs CTAB and moving good air Gastrointestinal: Soft nontender Musculoskeletal: No lower extremity edema   Neurologic:  Left facial droop no movement in her left arm but no pronator drift and normal strength and right arm 5 out of 5 strength bilateral lower extremities Slurred speech Skin:  Skin is warm, dry and intact. No rash noted. Psychiatric: Mood and affect are normal. Speech and behavior are normal.    ____________________________________________   DIFFERENTIAL includes but not limited to  Stroke, TIA, acute coronary syndrome, hyperkalemia ____________________________________________   LABS (all labs ordered are listed, but only abnormal results are displayed)  Labs Reviewed  CBC - Abnormal; Notable for the following:       Result Value   RBC 3.79 (*)    All other components within normal limits  COMPREHENSIVE METABOLIC PANEL - Abnormal; Notable for the following:    Glucose, Bld 104 (*)    BUN 37 (*)    Creatinine, Ser 1.65 (*)    Total Protein 5.9 (*)    Albumin 3.4 (*)    GFR calc non Af Amer 29 (*)    GFR calc Af Amer 34 (*)    All  other components within normal limits  URINALYSIS, ROUTINE W REFLEX MICROSCOPIC - Abnormal; Notable for the following:    APPearance TURBID (*)    Leukocytes, UA SMALL (*)    Bacteria, UA FEW (*)    Squamous Epithelial / LPF TOO NUMEROUS TO COUNT (*)    All other components within normal limits  ETHANOL  PROTIME-INR  APTT  DIFFERENTIAL  TROPONIN I  GLUCOSE, CAPILLARY  TSH  HEMOGLOBIN A1C  LIPID PANEL    Blood work reviewed and interpreted by me shows chronic kidney disease and no evidence of urinary tract infection __________________________________________  EKG  ED ECG REPORT I, Merrily Brittle, the attending physician, personally  viewed and interpreted this ECG.  Date: 06/03/2017 EKG Time:  Rate: 36 Rhythm: Sinus bradycardia QRS Axis: normal Intervals: normal ST/T Wave abnormalities: normal Narrative Interpretation: no evidence of acute ischemia  ____________________________________________  RADIOLOGY  CT scan reviewed by me shows chronic changes but no acute disease ____________________________________________   PROCEDURES  Procedure(s) performed: no  Procedures  Critical Care performed: yes  CRITICAL CARE Performed by: Merrily Brittle   Total critical care time: 35 minutes  Critical care time was exclusive of separately billable procedures and treating other patients.  Critical care was necessary to treat or prevent imminent or life-threatening deterioration.  Critical care was time spent personally by me on the following activities: development of treatment plan with patient and/or surrogate as well as nursing, discussions with consultants, evaluation of patient's response to treatment, examination of patient, obtaining history from patient or surrogate, ordering and performing treatments and interventions, ordering and review of laboratory studies, ordering and review of radiographic studies, pulse oximetry and re-evaluation of patient's  condition.   Observation: no ____________________________________________   INITIAL IMPRESSION / ASSESSMENT AND PLAN / ED COURSE  Pertinent labs & imaging results that were available during my care of the patient were reviewed by me and considered in my medical decision making (see chart for details).  the patient arrives with obvious left facial droop as well as left upper extremity weakness which she says is chronic. She does have some slurred speech. Her symptoms began 3 hours prior to her arrival which would put her theoretically in a TPA window. Code stroke is been called should be taken emergently to the scanner.      __----------------------------------------- 5:10 PM on 06/03/2017 -----------------------------------------  I discussed the case with the telemetry neurologist specialist on call who feels the patient is not a TPA candidate.__________________________________________   ----------------------------------------- 5:56 PM on 06/03/2017 -----------------------------------------  The patient's neuro symptoms are currently back to her baseline. She has however bradycardic down into the 20s and 30s. On chart review she has a long-standing history of sinus bradycardia in the past along with syncope. She was seen by both Dr. Mariah Milling and Graciela Husbands Who felt that her syncope was neurological no did not want to put a pacemaker in her. Her EKGs today have been showing sinus bradycardia and junctional bradycardia although no clear high-grade block.at this point I feel the patient requires inpatient admission for MRI of her brain as well as telemetry monitoring. Her symptoms could very well I'll be secondary to symptomatic bradycardia.   ----------------------------------------- 6:05 PM on 06/03/2017 -----------------------------------------  I discussed the case with Dr. Kirke Corin on call for the patient's cardiologist Dr. Graciela Husbands who will kindly consult on the patient.  FINAL CLINICAL  IMPRESSION(S) / ED DIAGNOSES  Final diagnoses:  Transient cerebral ischemia, unspecified type  Symptomatic bradycardia      NEW MEDICATIONS STARTED DURING THIS VISIT:  Current Discharge Medication List       Note:  This document was prepared using Dragon voice recognition software and may include unintentional dictation errors.     Merrily Brittle, MD 06/03/17 2326

## 2017-06-03 NOTE — Progress Notes (Addendum)
Patient admitted to room 237 at shift change with the diagnosis of sinus bradycadia. Alert and oriented x 4 with slurred speech. Denied any acute pain at this moment. Tele box called to CCMD and was verified by Susa Raring, Skin assessment done with Misty Stanley as well. Noted redness on the upper chest and abrasion on bilateral lower extremities. Left upper arm is contractured. Patient is oriented to her room, call bell/ascom. Bed alarm activated and the bed is in the lowest position. Will continue to monitor.

## 2017-06-03 NOTE — ED Notes (Signed)
SOC    CALLED 

## 2017-06-03 NOTE — Progress Notes (Signed)
Patient has been having frequent pauses which according to the CCMD they are uncountable. The largest pause reported was 2.85 seconds. Patient is asymptomatic at this moment on re-assessmnet. Alert and oriented x 4.  HR fluctuating from 30-55 and per CCMD patient's HR has dropped to 27 non-sustained. On rechecked she was fine. Dr. Anne Hahn notified and he indicated to continue to monitor and if he HR sustain at 30s, this RN should call back for a new advise. Will continue to monitor.

## 2017-06-04 ENCOUNTER — Inpatient Hospital Stay: Payer: Medicare Other

## 2017-06-04 ENCOUNTER — Encounter: Payer: Self-pay | Admitting: Nurse Practitioner

## 2017-06-04 ENCOUNTER — Inpatient Hospital Stay (HOSPITAL_COMMUNITY)
Admit: 2017-06-04 | Discharge: 2017-06-04 | Disposition: A | Discharge status: Home / Self Care | Payer: Medicare Other | Attending: Internal Medicine | Admitting: Internal Medicine

## 2017-06-04 DIAGNOSIS — I639 Cerebral infarction, unspecified: Principal | ICD-10-CM

## 2017-06-04 DIAGNOSIS — I495 Sick sinus syndrome: Secondary | ICD-10-CM

## 2017-06-04 DIAGNOSIS — I371 Nonrheumatic pulmonary valve insufficiency: Secondary | ICD-10-CM

## 2017-06-04 DIAGNOSIS — I361 Nonrheumatic tricuspid (valve) insufficiency: Secondary | ICD-10-CM

## 2017-06-04 DIAGNOSIS — I10 Essential (primary) hypertension: Secondary | ICD-10-CM

## 2017-06-04 DIAGNOSIS — R001 Bradycardia, unspecified: Secondary | ICD-10-CM

## 2017-06-04 LAB — ECHOCARDIOGRAM COMPLETE
Height: 60.1 in
Weight: 2145.6 oz

## 2017-06-04 LAB — LIPID PANEL
Cholesterol: 118 mg/dL (ref 0–200)
HDL: 39 mg/dL — ABNORMAL LOW (ref >40)
LDL Cholesterol: 64 mg/dL (ref 0–99)
Total CHOL/HDL Ratio: 3 RATIO
Triglycerides: 76 mg/dL (ref <150)
VLDL: 15 mg/dL (ref 0–40)

## 2017-06-04 LAB — HEMOGLOBIN A1C
Hgb A1c MFr Bld: 5.1 % (ref 4.8–5.6)
Mean Plasma Glucose: 99.67 mg/dL

## 2017-06-04 LAB — TSH: TSH: 1.483 u[IU]/mL (ref 0.350–4.500)

## 2017-06-04 LAB — MRSA PCR SCREENING: MRSA by PCR: NEGATIVE

## 2017-06-04 MED ORDER — ASPIRIN 81 MG PO CHEW
81.0000 mg | CHEWABLE_TABLET | Freq: Every day | ORAL | Status: DC
  Administered 2017-06-05 – 2017-06-07 (×3): 81 mg via ORAL
  Filled 2017-06-04 (×3): qty 1

## 2017-06-04 MED ORDER — PANTOPRAZOLE SODIUM 40 MG PO TBEC
40.0000 mg | DELAYED_RELEASE_TABLET | Freq: Every day | ORAL | Status: DC
  Administered 2017-06-04 – 2017-06-07 (×4): 40 mg via ORAL
  Filled 2017-06-04 (×4): qty 1

## 2017-06-04 MED ORDER — ATORVASTATIN CALCIUM 20 MG PO TABS
40.0000 mg | ORAL_TABLET | Freq: Every day | ORAL | Status: DC
  Administered 2017-06-04 – 2017-06-07 (×4): 40 mg via ORAL
  Filled 2017-06-04 (×5): qty 2

## 2017-06-04 MED ORDER — CLOPIDOGREL BISULFATE 75 MG PO TABS
75.0000 mg | ORAL_TABLET | Freq: Every day | ORAL | Status: DC
  Administered 2017-06-04 – 2017-06-07 (×4): 75 mg via ORAL
  Filled 2017-06-04 (×4): qty 1

## 2017-06-04 NOTE — Progress Notes (Signed)
PT Cancellation Note  Patient Details Name: Joanna Stone MRN: [id number] DOB: [birth year]   Cancelled Treatment:    Reason Eval/Treat Not Completed: Patient at procedure or test/unavailable.  PT consult received.  Chart reviewed.  Pt currently not in her room (nursing reports pt currently off floor for MRI).  Will re-attempt PT eval at a later date/time.  Hendricks Limes, PT 06/04/17, 10:22 AM [phone]

## 2017-06-04 NOTE — Progress Notes (Signed)
Called by radiology dept that CTA neck was ordered but patient has GFR <30, so contrast is contraindicated per protocol.  CTA cancelled, primary team and neurology can readdress this diagnostic process tomorrrow.  Kristeen Miss [facility] 06/04/2017, 9:46 PM

## 2017-06-04 NOTE — Evaluation (Signed)
Occupational Therapy Evaluation Patient Details Name: Joanna Stone MRN: [id number] DOB: Dec 13, 1940 Today's Date: 06/04/2017    History of Present Illness Pt is a 76 y.o.femalewith a history of asymptomatic bradycardia, CKD III-IV, CVA with left-sided weakness and dysarthria, HTN, tob abuse, and chronic bradycardia. Presented to ED on 9/23 for increased slurred speech. MRI indicates 7 mm acute infarct in the left insula and small area of subacute infarct in the left temporoparietal white matter.   Clinical Impression   Pt seen for OT evaluation this date. Pt alert and oriented to self, situation, and place, able to follow simple commands consistently but with cues to redirect, as pt was distractible and perseverating on getting coffee throughout session. Pt is a poor historian and difficult for pt to provide verbal detail regarding how much assistance she required for ADL and mobility. Pt reports multiple falls in past year, primarily with shower transfers, but unable to provide additional detail. Per ALF via phone call after OT evaluation completed, pt required supervision level assist for stand pivot transfers to/from her wheelchair, commode, and bed at baseline. Pt receives assist from ALF for bathing, dressing, med mgt, and set up of food including cutting with pt able to independently self feed once set up at baseline. Pt currently limited with bed mobility/transfers, difficult to assess, as pt declining bed mobility with OT due to not having her wheelchair present. Pt able to independently manage drink. RUE WFL, LUE with wrist/hand flexion contracture (has at baseline from previous CVA). Pt would benefit from skilled OT services to address noted impairments (see below for detail) and functional deficits in order to maximize return to PLOF and minimize risk of future falls, including functional transfer training, self care skills, and falls prevention. Currently recommending transition to [facility] in  setting of her ALF.     Follow Up Recommendations  Home health OT [facility] OT in the setting of her ALF)    Equipment Recommendations       Recommendations for Other Services       Precautions / Restrictions Precautions Precautions: Fall Precaution Comments: monitor heart rate Restrictions Weight Bearing Restrictions: No      Mobility Bed Mobility   General bed mobility comments: did not attempt due to pt's cognition/safety, noted "bed/wheelchair bound" in chart, will confirm  Transfers                 General transfer comment: did not attempt due to pt's cognition/safety, noted "bed/wheelchair bound" in chart, will confirm    Balance                                           ADL either performed or assessed with clinical judgement   ADL Overall ADL's : Needs assistance/impaired                                       General ADL Comments: requires assist for bathing, dressing, toileting, set up for self feeding and grooming tasks, unclear how this differs from baseline currently     Vision Baseline Vision/History: Wears glasses Wears Glasses: At all times Patient Visual Report: No change from baseline Vision Assessment?: No apparent visual deficits     Perception     Praxis      Pertinent Vitals/Pain Pain Assessment:  No/denies pain     Hand Dominance Right   Extremity/Trunk Assessment Upper Extremity Assessment Upper Extremity Assessment:  (RUE WFL; LUE weakness from previous CVA with wrist flexion and hand contractures)   Lower Extremity Assessment Lower Extremity Assessment: Defer to PT evaluation       Communication Communication Communication:  (dysarthria, pt reports at baseline)   Cognition Arousal/Alertness: Awake/alert Behavior During Therapy: WFL for tasks assessed/performed Overall Cognitive Status: No family/caregiver present to determine baseline cognitive functioning                                  General Comments: vascular dementia in PMHx; pt oriented to name, DOB, situation, and place (disoriented to day/month/year); perseverates on having a cup of coffee, requires verbal cues to redirect, follows simple commands consistently   General Comments       Exercises     Shoulder Instructions      Home Living Family/patient expects to be discharged to:: Assisted living ([facility])                             Home Equipment: Wheelchair - manual          Prior Functioning/Environment Level of Independence: Needs assistance  Gait / Transfers Assistance Needed: pt indep with wc mobility self propels with BLE, per ALF performs SPT from wc to/from bed and commode with supervision ADL's / Homemaking Assistance Needed: per pt and ALF, pt receives assist for bathing, dressing, medication mgt, and set up/cutting of food with pt indep with self feeding after set up.  Communication / Swallowing Assistance Needed: Dysarthria with speech, pt reports this at baseline Comments: pt endorses "several" falls in past year, most occuring when attempting to get out of shower        OT Problem List: Decreased cognition;Decreased strength;Decreased activity tolerance;Impaired balance (sitting and/or standing)      OT Treatment/Interventions: Self-care/ADL training;Therapeutic exercise;Therapeutic activities;Neuromuscular education;Energy conservation;Visual/perceptual remediation/compensation;DME and/or AE instruction;Patient/family education;Balance training    OT Goals(Current goals can be found in the care plan section) Acute Rehab OT Goals Patient Stated Goal: go back to ALF OT Goal Formulation: With patient Time For Goal Achievement: 06/18/17 Potential to Achieve Goals: Good  OT Frequency: Min 2X/week   Barriers to D/C:            Co-evaluation              AM-PAC PT "6 Clicks" Daily Activity     Outcome Measure Help from another person eating  meals?: A Little Help from another person taking care of personal grooming?: A Little Help from another person toileting, which includes using toliet, bedpan, or urinal?: A Lot Help from another person bathing (including washing, rinsing, drying)?: A Lot Help from another person to put on and taking off regular upper body clothing?: A Little Help from another person to put on and taking off regular lower body clothing?: A Lot 6 Click Score: 15   End of Session Nurse Communication: Other (comment) (notified nurse tech of pt's request for coffee)  Activity Tolerance: Patient tolerated treatment well Patient left: in bed;with call bell/phone within reach;with bed alarm set  OT Visit Diagnosis: Other abnormalities of gait and mobility (R26.89);Other symptoms and signs involving cognitive function                Time: 2952-8413 OT Time Calculation (  min): 18 min Charges:  OT General Charges $OT Visit: 1 Visit OT Evaluation $OT Eval Low Complexity: 1 Low G-Codes: OT G-codes **NOT FOR INPATIENT CLASS** Functional Assessment Tool Used: AM-PAC 6 Clicks Daily Activity;Clinical judgement Functional Limitation: Self care Self Care Current Status (W0981): At least 40 percent but less than 60 percent impaired, limited or restricted Self Care Goal Status (X9147): At least 20 percent but less than 40 percent impaired, limited or restricted   Richrd Prime, MPH, MS, OTR/L ascom [phone] 06/04/17, 3:51 PM

## 2017-06-04 NOTE — Consult Note (Signed)
Cardiology Consult    Patient ID: AMESHA BAILEY MRN: [id number], DOB/AGE: 76/18/1942   Admit date: 06/03/2017 Date of Consult: 06/04/2017  Primary Physician: Patient, No Pcp Per Primary Cardiologist: Odessa Fleming, MD  Requesting Provider: Q. Chen  Patient Profile    Joanna Stone is a 76 y.o. female with a history of asymptomatic bradycardia, CKD III-IV, CVA, HTN, and tob abuse, who is being seen today for the evaluation of asymptomatic bradycardia at the request of Dr. Imogene Burn.  Past Medical History   Past Medical History:  Diagnosis Date  . Arthritis   . CKD (chronic kidney disease), stage III   . 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 History of brain surgery 2001  . History of depression   . Hypertension   . Sinus node dysfunction w/ competing jxnl rhythm    a. 07/2012 Ech: EF >55%, no rwma, mild MR/TR/AI, mildly dil LA.  . Tobacco abuse    a. 05/2017 currently smokes 2 cigarettes/day.  . Vascular dementia     Past Surgical History:  Procedure Laterality Date  . ABDOMINAL HYSTERECTOMY    . APPENDECTOMY    . TONSILLECTOMY       Allergies  Allergies  Allergen Reactions  . Ultram [Tramadol]     vomiting    History of Present Illness    76 year old ? With the above complex past medical history including asymptomatic bradycardia with rates frequently in the 30s, stage 3-4 chronic kidney disease, prior CVA with residual left-sided hemiparesis, hypertension, vascular dementia, and ongoing tobacco abuse. She has severe debility and is bed/wheelchair bound and has been for several years. She has previously been evaluated by Dr. Graciela Husbands in the setting of sinus node dysfunction with competing junctional rhythm and bradycardia. It was mutually decided in 2014, that given that she was asymptomatic and otherwise hemodynamically stable, she would not undergo pacemaker placement at that time.  Patient was in her usual state of health until September 23, when  she was at her assisted living facility and was noted by staff to slur her speech. Do not setting, EMS was called and she was taken to the [facility] emergency department. Here, head CT did not show any acute intracranial abnormality. Chest x-ray showed no acute findings. EKG showed sinus bradycardia with junctional escape . Rates dipped as low as the high 20s and averaged in the 30s to low 40s. She was admitted at one point treated for atropine, the blood pressure was always stable, and ranging from 140-180 systolic. Cardiology was asked to evaluate. She currently denies any chest pain, dyspnea, presyncope. Slurring of speech resolved yesterday. She is very eager to go home. She says that if we thought she needed a pacemaker, she would be interested in pursuing this. She denies any recent history of chest pain or dyspnea, though as noted above, activity is very limited.  Inpatient Medications    . acetaminophen  1,000 mg Oral Q12H  . aspirin EC  325 mg Oral Daily  . calcium-vitamin D  1 tablet Oral Daily  . citalopram  40 mg Oral Daily  . enalapril  10 mg Oral Daily  . enoxaparin (LOVENOX) injection  30 mg Subcutaneous Q24H  . ferrous sulfate  325 mg Oral BID WC  . gabapentin  600 mg Oral TID  . hydrALAZINE  50 mg Oral TID  . loratadine  10 mg Oral Daily  . pantoprazole  40 mg Oral Daily  . polyethylene  glycol  17 g Oral Daily  . pravastatin  20 mg Oral q1800    Family History    Family History  Problem Relation Age of Onset  . Family history unknown: Yes    Social History    Social History   Social History  . Marital status: Widowed    Spouse name: N/A  . Number of children: N/A  . Years of education: N/A   Occupational History  . Not on file.   Social History Main Topics  . Smoking status: Current Every Day Smoker    Packs/day: 1.00    Years: 0.00    Types: Cigarettes  . Smokeless tobacco: Never Used     Comment: currently smoking 2 cigarettes/day  . Alcohol  use No  . Drug use: No  . Sexual activity: Not on file   Other Topics Concern  . Not on file   Social History Narrative   Lives @ assisted living.  Bed/wheelchair bound.     Review of Systems    General:  No chills, fever, night sweats or weight changes.  Cardiovascular:  No chest pain, dyspnea on exertion, edema, orthopnea, palpitations, paroxysmal nocturnal dyspnea. Dermatological: No rash, lesions/masses Respiratory: No cough, dyspnea Urologic: No hematuria, dysuria Abdominal:   No nausea, vomiting, diarrhea, bright red blood per rectum, melena, or hematemesis Neurologic:  Slurring of speech. She is chronic deconditioning/weakness in his bed/wheelchair bound. No visual changes, changes in mental status. All other systems reviewed and are otherwise negative except as noted above.  Physical Exam    Blood pressure (!) 158/79, pulse 75, temperature 98.1 F (36.7 C), temperature source Oral, resp. rate 18, height 5' 0.1" (1.527 m), weight 134 lb 1.6 oz (60.8 kg), SpO2 92 %.  General: Pleasant, NAD Psych: Normal affect. Neuro: Alert and oriented X 3. Moves all extremities spontaneously. HEENT: Normal  Neck: Supple without bruits or JVD. Lungs:  Resp regular and unlabored, diminished breath sounds bilat. Heart: Irreg, brady, distant, no s3, s4, or murmurs. Abdomen: Soft, non-tender, non-distended, BS + x 4.  Extremities: No clubbing, cyanosis or edema. DP/PT/Radials 2+ and equal bilaterally.  Labs    Recent Labs  06/03/17 1706  TROPONINI <0.03   Lab Results  Component Value Date   WBC 7.2 06/03/2017   HGB 12.5 06/03/2017   HCT 36.6 06/03/2017   MCV 96.5 06/03/2017   PLT 190 06/03/2017     Recent Labs Lab 06/03/17 1706  NA 139  K 4.8  CL 108  CO2 23  BUN 37*  CREATININE 1.65*  CALCIUM 9.0  PROT 5.9*  BILITOT 0.4  ALKPHOS 53  ALT 16  AST 23  GLUCOSE 104*   Lab Results  Component Value Date   CHOL 118 06/04/2017   HDL 39 (L) 06/04/2017   LDLCALC 64  06/04/2017   TRIG 76 06/04/2017     Radiology Studies    Mr Brain Wo Contrast  Result Date: 06/04/2017 CLINICAL DATA:  Worsening slurred speech. Chronic left-sided weakness and dysarthria due to old stroke. EXAM: MRI HEAD WITHOUT CONTRAST MRA HEAD WITHOUT CONTRAST TECHNIQUE: Multiplanar, multiecho pulse sequences of the brain and surrounding structures were obtained without intravenous contrast. Angiographic images of the head were obtained using MRA technique without contrast. COMPARISON:  Head CT from yesterday FINDINGS: MRI HEAD FINDINGS Brain: Subcentimeter acute infarct in the left insula. There is a small area of elongated more weakly restricted diffusion in the left periatrial white matter. No acute hemorrhage, hydrocephalus, or masslike finding.  Remote right cerebral white matter infarct affecting the deep white matter tracts and lateral thalamus, with wallerian changes seen throughout the brainstem. There is gliosis in the left temporal operculum and temporal pole which could be post ischemic or from history prior hemorrhage in surgery. Periventricular chronic microvascular ischemic gliosis in the cerebral white matter. Vascular: Arterial findings below. Normal dural venous sinus flow voids. Skull and upper cervical spine: Left C2-3 facet arthropathy. Previous left craniotomy, reportedly for subdural hematoma evacuation per 2003 head CT report. Sinuses/Orbits: Negative MRA HEAD FINDINGS Carotid and vertebral arteries are symmetric. Less numerous right superior division MCA branches with chronic appearance. No acute branch occlusion. There is a high-grade right and moderate left P2 segment stenosis, likely atheromatous. 2 mm outpouching projecting inferiorly from the left supraclinoid ICA. No visible emanating vessel. IMPRESSION: Brain MRI: 1. 7 mm acute infarct in the left insula. Small area of subacute infarct in the left temporoparietal white matter. 2. Remote right cerebral and thalamic infarct  with wallerian degeneration in the brainstem. 3. Atrophy and chronic small vessel ischemia. 4. Remote left craniotomy, reportedly for remote subdural hematoma evacuation Intracranial MRA: 1. No acute finding. 2. Paucity of right upper division MCA branches. 3. High-grade right and moderate left P2 segment stenoses. 4. 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 Electronically Signed   By: Marnee Spring M.D.   On: 06/04/2017 11:55   Dg Chest Port 1 View  Result Date: 06/03/2017 CLINICAL DATA:  Patient is poor historian at this time. Encounter for bradycardia. Hx HTN, CVA. Current smoker. EXAM: PORTABLE CHEST 1 VIEW COMPARISON:  07/31/2012 FINDINGS: Stable enlarged cardiac silhouette. No pulmonary edema or pleural fluid. No infiltrate. No pneumothorax. No acute osseous abnormality. IMPRESSION: Cardiomegaly without acute findings. Electronically Signed   By: Genevive Bi M.D.   On: 06/03/2017 17:50   Mr Maxine Glenn Head/brain WU Cm  Result Date: 06/04/2017 CLINICAL DATA:  Worsening slurred speech. Chronic left-sided weakness and dysarthria due to old stroke. EXAM: MRI HEAD WITHOUT CONTRAST MRA HEAD WITHOUT CONTRAST TECHNIQUE: Multiplanar, multiecho pulse sequences of the brain and surrounding structures were obtained without intravenous contrast. Angiographic images of the head were obtained using MRA technique without contrast. COMPARISON:  Head CT from yesterday FINDINGS: MRI HEAD FINDINGS Brain: Subcentimeter acute infarct in the left insula. There is a small area of elongated more weakly restricted diffusion in the left periatrial white matter. No acute hemorrhage, hydrocephalus, or masslike finding. Remote right cerebral white matter infarct affecting the deep white matter tracts and lateral thalamus, with wallerian changes seen throughout the brainstem. There is gliosis in the left temporal operculum and temporal pole which could be post ischemic or from history prior hemorrhage in  surgery. Periventricular chronic microvascular ischemic gliosis in the cerebral white matter. Vascular: Arterial findings below. Normal dural venous sinus flow voids. Skull and upper cervical spine: Left C2-3 facet arthropathy. Previous left craniotomy, reportedly for subdural hematoma evacuation per 2003 head CT report. Sinuses/Orbits: Negative MRA HEAD FINDINGS Carotid and vertebral arteries are symmetric. Less numerous right superior division MCA branches with chronic appearance. No acute branch occlusion. There is a high-grade right and moderate left P2 segment stenosis, likely atheromatous. 2 mm outpouching projecting inferiorly from the left supraclinoid ICA. No visible emanating vessel. IMPRESSION: Brain MRI: 1. 7 mm acute infarct in the left insula. Small area of subacute infarct in the left temporoparietal white matter. 2. Remote right cerebral and thalamic infarct with wallerian degeneration in the brainstem. 3. Atrophy and  chronic small vessel ischemia. 4. Remote left craniotomy, reportedly for remote subdural hematoma evacuation Intracranial MRA: 1. No acute finding. 2. Paucity of right upper division MCA branches. 3. High-grade right and moderate left P2 segment stenoses. 4. 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 Electronically Signed   By: Marnee Spring M.D.   On: 06/04/2017 11:55   Ct Head Code Stroke Wo Contrast  Result Date: 06/03/2017 CLINICAL DATA:  Code stroke.  Slurred speech and left-sided weakness EXAM: CT HEAD WITHOUT CONTRAST TECHNIQUE: Contiguous axial images were obtained from the base of the skull through the vertex without intravenous contrast. COMPARISON:  08/20/2016 FINDINGS: Brain: Moderate atrophy. Chronic ischemic changes in the deep white matter on the right stable from the prior study. Chronic microvascular ischemia in the cerebral white matter bilaterally. Negative for acute infarct, hemorrhage, or mass lesion. Vascular: Negative for hyperdense  vessel. Atherosclerotic calcification in the carotid artery bilaterally Skull: Chronic craniotomy on the left unchanged Sinuses/Orbits: Negative Other: None ASPECTS (Alberta Stroke Program Early CT Score) - Ganglionic level infarction (caudate, lentiform nuclei, internal capsule, insula, M1-M3 cortex): 7 - Supraganglionic infarction (M4-M6 cortex): 3 Total score (0-10 with 10 being normal): 10 IMPRESSION: 1. No acute intracranial abnormality. 2. Atrophy and extensive chronic ischemic change. 3. ASPECTS is 10 These results were called by telephone at the time of interpretation on 06/03/2017 at 5:15 pm to Dr. Merrily Brittle , who verbally acknowledged these results. Electronically Signed   By: Marlan Palau M.D.   On: 06/03/2017 17:15    ECG & Cardiac Imaging    Sinus rhythm, 51, junctional escape. She does have some variable P-wave morphology is difficult to tell if heart block is present. 12-lead rhythm strip requested.  She has similar rhythm on telemetry with pauses up to 4+ seconds. Always symptomatically.  Assessment & Plan    1. Sinus node dysfunction with junctional bradycardia: Patient has a history of sinus node dysfunction in a symptomatically bradycardia dating back several years. She was last evaluated by electrophysiology in December 2015 with a plan to continue conservative therapy given absence of symptoms and relative disability. She presented with slurring of speech in the setting of hemodynamically stable bradycardia with junctional escape. 12-lead rhythm strip pending to assess for higher levels of heart block as this is difficult to ascertain on initial ECG. Unable to print telemetry strips. Patient is currently asymptomatic. Echocardiogram has been performed and preliminarily, has shown normal LV function. We will ask Dr. Graciela Husbands to review her case tomorrow morning to determine whether not pacing is appropriate. Avoid AV nodal blocking agents.  2. TIA: Head CT nonacute. MRI pending.  Per internal medicine/neurology. Of note, she was hemodynamically stable on admission, thus making bradycardia and unlikely culprit for her symptoms.  3. Essential hypertension: Continue ACE inhibitor. Reluctant to treat blood pressure too aggressively in the setting of above.  4. Tobacco abuse: Cessation advised.   5. Stage 3-4 chronic kidney disease: She is on long-term ACE inhibitor therapy.  Signed, Nicolasa Ducking, NP 06/04/2017, 12:08 PM  For questions or updates, please contact   Please consult [email or web address] for contact info under Cardiology/STEMI.

## 2017-06-04 NOTE — Clinical Social Work Note (Signed)
CSW was informed that patient is from [facility].  CSW awaiting PT recommendations.  Ervin Knack. Peyten Weare, MSW, Theresia Majors [phone]  06/04/2017 4:10 PM

## 2017-06-04 NOTE — Evaluation (Signed)
Physical Therapy Evaluation Patient Details Name: Joanna Stone MRN: [id number] DOB: 1941/04/10 Today's Date: 06/04/2017   History of Present Illness  Pt is a 76 y.o.femalewith a history of asymptomatic bradycardia, CKD III-IV, CVA with left-sided weakness and dysarthria, HTN, tob abuse, and chronic bradycardia. Presented to ED on 9/23 for increased slurred speech. MRI indicates 7 mm acute infarct in the left insula and small area of subacute infarct in the left temporoparietal white matter.  Clinical Impression  Prior to hospital admission, pt was performing stand pivots bed to w/c or commode with SBA (pt reports going to R side d/t h/o stroke with L sided weakness).  Pt lives at [facility].  Currently pt is mod assist supine to/from sit.  Pt refused to transfer to bedside chair and pt perseverated on needing her own personal w/c at hospital (pt reports only able to transfer to this w/c) and also pt perseverating on her coffee during session limiting assessment.  HR fluctuating in 50-60's bpm during session.  Pt would benefit from skilled PT to address noted impairments and functional limitations (see below for any additional details) during hospitalization.  Upon hospital discharge, recommend pt discharge back to facility with HHPT.    Follow Up Recommendations Home health PT;Supervision/Assistance - 24 hour    Equipment Recommendations  Other (comment) (pt already has w/c at home)    Recommendations for Other Services       Precautions / Restrictions Precautions Precautions: Fall Precaution Comments: monitor heart rate Restrictions Weight Bearing Restrictions: No      Mobility  Bed Mobility Overal bed mobility: Needs Assistance Bed Mobility: Supine to Sit     Supine to sit: Mod assist;HOB elevated Sit to supine: Mod assist;HOB elevated   General bed mobility comments: assist for trunk supine to/from sit to R side and HOB elevated  Transfers Overall transfer level:  Needs assistance   Transfers: Lateral/Scoot Transfers          Lateral/Scoot Transfers: Min guard General transfer comment: pt able to scoot minimal distance to R on edge of bed using bedrail; pt refused attempt to transfer to chair d/t only wanting to transfer to her w/c  Ambulation/Gait             General Gait Details: Deferred d/t pt w/c level at baseline  Stairs            Wheelchair Mobility    Modified Rankin (Stroke Patients Only)       Balance Overall balance assessment: Needs assistance;History of Falls Sitting-balance support: Single extremity supported;Feet supported Sitting balance-Leahy Scale: Fair Sitting balance - Comments: pt able to sit on edge of bed drinking her coffee with SBA to CGA (min assist if leaning backwards to regain upright posture)                                     Pertinent Vitals/Pain Pain Assessment: No/denies pain    Home Living Family/patient expects to be discharged to:: Assisted living ([facility])               Home Equipment: Wheelchair - manual      Prior Function Level of Independence: Needs assistance   Gait / Transfers Assistance Needed: pt indep with wc mobility (self propels with BLE), per ALF performs SPT from wc to/from bed and commode with supervision  ADL's / Homemaking Assistance Needed: per pt and ALF,  pt receives assist for bathing, dressing, medication mgt, and set up/cutting of food with pt indep with self feeding after set up.   Comments: pt endorses "several" falls in past year, most occuring when attempting to get out of shower     Hand Dominance   Dominant Hand: Right    Extremity/Trunk Assessment   Upper Extremity Assessment Upper Extremity Assessment: Defer to OT evaluation    Lower Extremity Assessment Lower Extremity Assessment: RLE deficits/detail;LLE deficits/detail RLE Deficits / Details: hip flexion, knee flexion/extension, and DF/PF at least 3/5 AROM  (pt did not follow directions to be able to assess LE MMT);  sensation and tone intact; pt did not follow directions well enough to assess coordination or proprioception LLE Deficits / Details: significant L LE PF contracture noted; at least 3/5 AROM hip flexion and knee flexion/extension (pt did not follow directions to be able to assess LE MMT); sensation intact; L hip and knee tone intact; pt did not follow directions well enough to assess proprioception or coordination    Cervical / Trunk Assessment Cervical / Trunk Assessment: Kyphotic  Communication   Communication:  (dysarthria (pt reports as baseline))  Cognition Arousal/Alertness: Awake/alert Behavior During Therapy: Anxious Overall Cognitive Status: No family/caregiver present to determine baseline cognitive functioning (Oriented to person, place, September 2018)                                 General Comments: vascular dementia in PMHx; perseverates on having a cup of coffee, requires verbal cues to redirect, follows simple commands consistently      General Comments   Nursing cleared pt for participation in physical therapy.  Pt agreeable to limited PT session (d/t not having her "w/c).    Exercises  Sitting balance edge of bed x10 minutes.   Assessment/Plan    PT Assessment Patient needs continued PT services  PT Problem List Decreased strength;Decreased balance;Decreased mobility;Decreased knowledge of precautions       PT Treatment Interventions DME instruction;Functional mobility training;Therapeutic activities;Therapeutic exercise;Balance training;Patient/family education    PT Goals (Current goals can be found in the Care Plan section)  Acute Rehab PT Goals Patient Stated Goal: go back to ALF PT Goal Formulation: With patient Time For Goal Achievement: 06/18/17 Potential to Achieve Goals: Good    Frequency 7X/week   Barriers to discharge        Co-evaluation               AM-PAC PT  "6 Clicks" Daily Activity  Outcome Measure Difficulty turning over in bed (including adjusting bedclothes, sheets and blankets)?: Unable Difficulty moving from lying on back to sitting on the side of the bed? : Unable Difficulty sitting down on and standing up from a chair with arms (e.g., wheelchair, bedside commode, etc,.)?: Unable Help needed moving to and from a bed to chair (including a wheelchair)?: A Lot Help needed walking in hospital room?: Total Help needed climbing 3-5 steps with a railing? : Total 6 Click Score: 7    End of Session   Activity Tolerance: Patient tolerated treatment well Patient left: in bed;with call bell/phone within reach;with bed alarm set (B heels elevated via pillow) Nurse Communication: Mobility status;Precautions PT Visit Diagnosis: Other abnormalities of gait and mobility (R26.89);Muscle weakness (generalized) (M62.81);History of falling (Z91.81)    Time: 9563-8756 PT Time Calculation (min) (ACUTE ONLY): 31 min   Charges:   PT Evaluation $PT Eval Low Complexity:  1 Low PT Treatments $Therapeutic Activity: 8-22 mins   PT G Codes:   PT G-Codes **NOT FOR INPATIENT CLASS** Functional Assessment Tool Used: AM-PAC 6 Clicks Basic Mobility Functional Limitation: Mobility: Walking and moving around Mobility: Walking and Moving Around Current Status (O9629): At least 80 percent but less than 100 percent impaired, limited or restricted Mobility: Walking and Moving Around Goal Status [phone]): At least 60 percent but less than 80 percent impaired, limited or restricted    Hendricks Limes, PT 06/04/17, 5:02 PM [phone]

## 2017-06-04 NOTE — Progress Notes (Signed)
Patient HR went up to the  upper 60s after the Atropine IV and her rhythm changed to Afib. Patient goes in and out of Afib and bradycardia  with no symptoms. Rate fluctuates now from 28-65 and she's  asymptomatic. DR. Sheryle Hail notified without any new order. Will continue to monitor.

## 2017-06-04 NOTE — Evaluation (Addendum)
Clinical/Bedside Swallow Evaluation Patient Details  Name: NYRA ANSPAUGH MRN: [id number] Date of Birth: [birth year]  Today's Date: 06/04/2017 Time: SLP Start Time (ACUTE ONLY): 1245 SLP Stop Time (ACUTE ONLY): 1340 SLP Time Calculation (min) (ACUTE ONLY): 55 min  Past Medical History:  Past Medical History:  Diagnosis Date  . Arthritis   . CKD (chronic kidney disease), stage III   . 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 History of brain surgery 2001  . History of depression   . Hypertension   . Sinus node dysfunction w/ competing jxnl rhythm    a. 07/2012 Ech: EF >55%, no rwma, mild MR/TR/AI, mildly dil LA.  . Tobacco abuse    a. 05/2017 currently smokes 2 cigarettes/day.  . Vascular dementia    Past Surgical History:  Past Surgical History:  Procedure Laterality Date  . ABDOMINAL HYSTERECTOMY    . APPENDECTOMY    . TONSILLECTOMY     HPI:  Pt is a 76 y.o. female with a known history of chronic CVA with left-sided weakness and dysarthria, brain surgery, vascular dementia, chronic kidney disease, essential hypertension, chronic bradycardia and other medical issues is sent over from [facility] facility for worsening of slurry speech which was started at 1400 today. CT head with no acute findings. Patient reports that she has chronic dysarthria which seemed to have been worse that afternoon. Patient also reports chronic bradycardia but she does not know her baseline, her heart rate was found to be as low as 30 in the ED and hospitalist team is called to admit the patient. Patient denies any dizziness or lightheadedness. No family members were at bedside. Currently, pt stated her speech was "more like her usual speech" and that she wanted "coffee". Pt was able to verbally communicate all of her wants/needs in a direct manner, Speech was intelligible though w/ lower volume at times. Pt followed commands and answered basic questions re: self. LUE  contracted(baseline). Min exaggerated body movements in bed(suspect related to baseline Cognitive status/decline). ADDENDUM: Of note, narrowing of the distal esophagus at the site of the  hiatal hernia which does not allow passage of the barium tablet per GI Barium study. Unsure if pt had f/u w/ GI then for potential dillitation. NSG updated.  Assessment / Plan / Recommendation Clinical Impression  Pt appears to present w/ adequate oropharyngeal phase swallow function w/ no immediate, overt s/s of aspiration noted during po trials given at this exam. Pt consumed trials of thin liquids and purees/softened solids w/ no overt coughing or throat clearing noted; vocal quality clear b/t trials and no decline in respiratory status occurred. Oral phase was c/b min slower, deliberate mastication of softened solids d/t dentition status but given time, she was able to masticate bolus trials appropriately and clear orally. No oral weakness noted. Pt was able to feed self w/ full setup assistance given d/t contraction of LUE(baseline). Recommend a dysphagia level 3 for softer foods, cut meats; Thin liquids. Recommend general aspiration precautions and assistance/monitoring at meals (as well as the environment) during oral intake d/t being easily distracted and quick (body) movements, talking - baseline Vacular Dementia which can impact swallowing awareness and safety. Recommend applesauce/puree to swallow Pills IF needed for safer, easier swallowing. Pt verbally communicated at sentence/phrase length w/ SLP/staff indicating all wants/needs; she was able to clearly communicate needs w/ intelligible speech but min lower volume at times. When she repeated herself if needed, she exhibited increased effort  to make her point. Unsure of her baseline communication/speech articulation abilities, but w/ her noted dysarthria and Vascular Dementia at baseline per chart notes, recommend f/u w/ skilled ST services post discharge once  back in her own environment for best assessment of any new changes or needs. NSG updated.  SLP Visit Diagnosis: Dysphagia, oropharyngeal phase (R13.12)    Aspiration Risk   (reduced following general precautions)    Diet Recommendation  Dysphagia level 3(mech soft) w/ Thin liquids - aspiration precautions and monitoring/assistance at meals d/t baseline Dementia and contracted LUE(unable to use to help feed self).  Medication Administration: Whole meds with puree (if needed for easier, safer swallowing)    Other  Recommendations Recommended Consults:  (Dietician f/u) Oral Care Recommendations: Oral care BID;Staff/trained caregiver to provide oral care Other Recommendations:  (n/a)   Follow up Recommendations None      Frequency and Duration  (n/a)   (n/a)       Prognosis Prognosis for Safe Diet Advancement: Fair (-Good) Barriers to Reach Goals: Cognitive deficits      Swallow Study   General Date of Onset: 06/03/17 HPI: Pt is a 76 y.o. female with a known history of chronic CVA with left-sided weakness and dysarthria, brain surgery, vascular dementia, chronic kidney disease, essential hypertension, chronic bradycardia and other medical issues is sent over from [facility] facility for worsening of slurry speech which was started at 1400 today. CT head with no acute findings. Patient reports that she has chronic dysarthria which seemed to have been worse that afternoon. Patient also reports chronic bradycardia but she does not know her baseline, her heart rate was found to be as low as 30 in the ED and hospitalist team is called to admit the patient. Patient denies any dizziness or lightheadedness. No family members were at bedside. Currently, pt stated her speech was "more like her usual speech" and that she wanted "coffee". Pt was able to verbally communicate all of her wants/needs in a direct manner, Speech was intelligible though w/ lower volume at times. Pt followed  commands and answered basic questions re: self. LUE contracted(baseline). Min exaggerated body movements in bed(suspect related to baseline Cognitive status/decline). Type of Study: Bedside Swallow Evaluation Previous Swallow Assessment: none reported by pt Diet Prior to this Study: Regular;Thin liquids Temperature Spikes Noted: No (wbc 7.2) Respiratory Status: Room air History of Recent Intubation: No Behavior/Cognition: Alert;Cooperative;Pleasant mood;Confused;Distractible;Requires cueing Oral Cavity Assessment: Dry Oral Care Completed by SLP: Recent completion by staff Oral Cavity - Dentition: Missing dentition Vision: Functional for self-feeding Self-Feeding Abilities: Able to feed self;Needs assist;Needs set up (LUE contraction baseline) Patient Positioning: Upright in bed (pt requested to sit upright) Baseline Vocal Quality: Normal (min rushed speech; min soft and mumbled) Volitional Cough: Strong Volitional Swallow: Able to elicit    Oral/Motor/Sensory Function Overall Oral Motor/Sensory Function: Within functional limits   Ice Chips Ice chips: Within functional limits Presentation: Spoon (fed; 3 trials)   Thin Liquid Thin Liquid: Within functional limits Presentation: Cup;Self Fed (8 trials) Other Comments: tended to sip quickly on the liquid    Nectar Thick Nectar Thick Liquid: Not tested   Honey Thick Honey Thick Liquid: Not tested   Puree Puree: Within functional limits Presentation: Spoon (fed; 5 trials)   Solid   GO   Solid: Impaired Presentation: Spoon (3 trials of mech soft/softened food) Oral Phase Impairments: Impaired mastication (min) Oral Phase Functional Implications:  (min) Pharyngeal Phase Impairments:  (none) Other Comments: cleared appropriately given  time    Functional Assessment Tool Used: clinical judgement Functional Limitations: Swallowing Swallow Current Status [phone]): At least 1 percent but less than 20 percent impaired, limited or  restricted Swallow Goal Status [phone]): At least 1 percent but less than 20 percent impaired, limited or restricted Swallow Discharge Status [phone]): At least 1 percent but less than 20 percent impaired, limited or restricted    Jerilynn Som, MS, CCC-SLP Watson,Katherine 06/04/2017,2:37 PM

## 2017-06-04 NOTE — Progress Notes (Signed)
[facility] - [place] [facility]   PATIENT NAME: Joanna Stone    MR#:  [id number]  DATE OF BIRTH:  06/21/1941  SUBJECTIVE:  CHIEF COMPLAINT:   Chief Complaint  Patient presents with  . Code Stroke    slurred speech REVIEW OF SYSTEMS:  Review of Systems  Constitutional: Negative for chills, fever and malaise/fatigue.  HENT: Negative for sore throat.   Eyes: Negative for blurred vision and double vision.  Respiratory: Negative for cough, hemoptysis, shortness of breath, wheezing and stridor.   Cardiovascular: Negative for chest pain, palpitations, orthopnea and leg swelling.  Gastrointestinal: Negative for abdominal pain, blood in stool, diarrhea, melena, nausea and vomiting.  Genitourinary: Negative for dysuria, flank pain and hematuria.  Musculoskeletal: Negative for back pain and joint pain.  Neurological: Positive for speech change. Negative for dizziness, sensory change, focal weakness, seizures, loss of consciousness, weakness and headaches.  Endo/Heme/Allergies: Negative for polydipsia.  Psychiatric/Behavioral: Negative for depression. The patient is not nervous/anxious.     DRUG ALLERGIES:   Allergies  Allergen Reactions  . Ultram [Tramadol]     vomiting   VITALS:  Blood pressure (!) 154/70, pulse 61, temperature 98.4 F (36.9 C), temperature source Oral, resp. rate 18, height 5' 0.1" (1.527 m), weight 134 lb 1.6 oz (60.8 kg), SpO2 98 %. PHYSICAL EXAMINATION:  Physical Exam  Constitutional: She is oriented to person, place, and time and well-developed, well-nourished, and in no distress.  HENT:  Head: Normocephalic.  Mouth/Throat: Oropharynx is clear and moist.  Eyes: Pupils are equal, round, and reactive to light. Conjunctivae and EOM are normal. No scleral icterus.  Neck: Normal range of motion. Neck supple. No JVD present. No tracheal deviation present.  Cardiovascular: Normal rate, regular rhythm and normal heart sounds.  Exam reveals  no gallop.   No murmur heard. Pulmonary/Chest: Effort normal and breath sounds normal. No respiratory distress. She has no wheezes. She has no rales.  Abdominal: Soft. Bowel sounds are normal. She exhibits no distension. There is no tenderness. There is no rebound.  Musculoskeletal: Normal range of motion. She exhibits no edema or tenderness.  Neurological: She is alert and oriented to person, place, and time.  Dysarthria, mild left facial droop Power 0/5 in left arm, 4/5 in right arm, 3/5 in both legs.  Skin: No rash noted. No erythema.  Psychiatric: Affect normal.   LABORATORY PANEL:  Female CBC  Recent Labs Lab 06/03/17 1706  WBC 7.2  HGB 12.5  HCT 36.6  PLT 190   ------------------------------------------------------------------------------------------------------------------ Chemistries   Recent Labs Lab 06/03/17 1706  NA 139  K 4.8  CL 108  CO2 23  GLUCOSE 104*  BUN 37*  CREATININE 1.65*  CALCIUM 9.0  AST 23  ALT 16  ALKPHOS 53  BILITOT 0.4   RADIOLOGY:  Mr Brain Wo Contrast  Result Date: 06/04/2017 CLINICAL DATA:  Worsening slurred speech. Chronic left-sided weakness and dysarthria due to old stroke. EXAM: MRI HEAD WITHOUT CONTRAST MRA HEAD WITHOUT CONTRAST TECHNIQUE: Multiplanar, multiecho pulse sequences of the brain and surrounding structures were obtained without intravenous contrast. Angiographic images of the head were obtained using MRA technique without contrast. COMPARISON:  Head CT from yesterday FINDINGS: MRI HEAD FINDINGS Brain: Subcentimeter acute infarct in the left insula. There is a small area of elongated more weakly restricted diffusion in the left periatrial white matter. No acute hemorrhage, hydrocephalus, or masslike finding. Remote right cerebral white matter infarct affecting the deep white matter tracts and lateral thalamus,  with wallerian changes seen throughout the brainstem. There is gliosis in the left temporal operculum and temporal pole  which could be post ischemic or from history prior hemorrhage in surgery. Periventricular chronic microvascular ischemic gliosis in the cerebral white matter. Vascular: Arterial findings below. Normal dural venous sinus flow voids. Skull and upper cervical spine: Left C2-3 facet arthropathy. Previous left craniotomy, reportedly for subdural hematoma evacuation per 2003 head CT report. Sinuses/Orbits: Negative MRA HEAD FINDINGS Carotid and vertebral arteries are symmetric. Less numerous right superior division MCA branches with chronic appearance. No acute branch occlusion. There is a high-grade right and moderate left P2 segment stenosis, likely atheromatous. 2 mm outpouching projecting inferiorly from the left supraclinoid ICA. No visible emanating vessel. IMPRESSION: Brain MRI: 1. 7 mm acute infarct in the left insula. Small area of subacute infarct in the left temporoparietal white matter. 2. Remote right cerebral and thalamic infarct with wallerian degeneration in the brainstem. 3. Atrophy and chronic small vessel ischemia. 4. Remote left craniotomy, reportedly for remote subdural hematoma evacuation Intracranial MRA: 1. No acute finding. 2. Paucity of right upper division MCA branches. 3. High-grade right and moderate left P2 segment stenoses. 4. 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 Electronically Signed   By: Marnee Spring M.D.   On: 06/04/2017 11:55   US Carotid Bilateral (at [facility] Only)  Result Date: 06/04/2017 CLINICAL DATA:  76 year old female with a history of TIA. Cardiovascular risk factors include hypertension, known prior stroke/TIA, tobacco use EXAM: BILATERAL CAROTID DUPLEX ULTRASOUND TECHNIQUE: Wallace Cullens scale imaging, color Doppler and duplex ultrasound were performed of bilateral carotid and vertebral arteries in the neck. COMPARISON:  No prior duplex FINDINGS: Criteria: Quantification of carotid stenosis is based on velocity parameters that correlate the residual  internal carotid diameter with NASCET-based stenosis levels, using the diameter of the distal internal carotid lumen as the denominator for stenosis measurement. The following velocity measurements were obtained: RIGHT ICA:  Systolic 126 cm/sec, Diastolic 24 cm/sec CCA:  107 cm/sec SYSTOLIC ICA/CCA RATIO:  1.2 ECA:  271 cm/sec LEFT ICA:  Systolic 149 cm/sec, Diastolic 25 cm/sec CCA:  106 cm/sec SYSTOLIC ICA/CCA RATIO:  1.4 ECA:  123 cm/sec Right Brachial SBP: Not acquired Left Brachial SBP: Not acquired RIGHT CAROTID ARTERY: No significant calcifications of the right common carotid artery. Intermediate waveform maintained. Heterogeneous and partially calcified plaque at the right carotid bifurcation. No significant lumen shadowing. Low resistance waveform of the right ICA. Mild tortuosity RIGHT VERTEBRAL ARTERY: Antegrade flow with low resistance waveform. LEFT CAROTID ARTERY: No significant calcifications of the left common carotid artery. Intermediate waveform maintained. Heterogeneous and partially calcified plaque at the left carotid bifurcation without significant lumen shadowing. Low resistance waveform of the left ICA. Mild tortuosity LEFT VERTEBRAL ARTERY:  Antegrade flow with low resistance waveform. Additional:  Irregular heart rate. IMPRESSION: Right: Color duplex indicates minimal heterogeneous and calcified plaque, with no hemodynamically significant stenosis by duplex criteria in the right extracranial cerebrovascular circulation. Left: Heterogeneous plaque at the carotid bifurcation, with discordant results regarding degree of stenosis by established duplex criteria. Peak velocity suggests 50% -69% stenosis, with the ICA/ CCA ratio suggesting a lesser degree of stenosis. If establishing a more accurate degree of stenosis is required, cerebral angiogram should be considered, or as a second best test, CTA. Note that there is irregular heart rate on the duplex exam, potentially atrial fibrillation.  Signed, Yvone Neu. Loreta Ave, DO [facility] Radiology Electronically Signed  By: Gilmer Mor D.O.   On: 06/04/2017 12:30   Dg Chest Port 1 View  Result Date: 06/03/2017 CLINICAL DATA:  Patient is poor historian at this time. Encounter for bradycardia. Hx HTN, CVA. Current smoker. EXAM: PORTABLE CHEST 1 VIEW COMPARISON:  07/31/2012 FINDINGS: Stable enlarged cardiac silhouette. No pulmonary edema or pleural fluid. No infiltrate. No pneumothorax. No acute osseous abnormality. IMPRESSION: Cardiomegaly without acute findings. Electronically Signed   By: Genevive Bi M.D.   On: 06/03/2017 17:50   Mr Maxine Glenn Head/brain UE Cm  Result Date: 06/04/2017 CLINICAL DATA:  Worsening slurred speech. Chronic left-sided weakness and dysarthria due to old stroke. EXAM: MRI HEAD WITHOUT CONTRAST MRA HEAD WITHOUT CONTRAST TECHNIQUE: Multiplanar, multiecho pulse sequences of the brain and surrounding structures were obtained without intravenous contrast. Angiographic images of the head were obtained using MRA technique without contrast. COMPARISON:  Head CT from yesterday FINDINGS: MRI HEAD FINDINGS Brain: Subcentimeter acute infarct in the left insula. There is a small area of elongated more weakly restricted diffusion in the left periatrial white matter. No acute hemorrhage, hydrocephalus, or masslike finding. Remote right cerebral white matter infarct affecting the deep white matter tracts and lateral thalamus, with wallerian changes seen throughout the brainstem. There is gliosis in the left temporal operculum and temporal pole which could be post ischemic or from history prior hemorrhage in surgery. Periventricular chronic microvascular ischemic gliosis in the cerebral white matter. Vascular: Arterial findings below. Normal dural venous sinus flow voids. Skull and upper cervical spine: Left C2-3 facet arthropathy. Previous left craniotomy, reportedly for subdural hematoma  evacuation per 2003 head CT report. Sinuses/Orbits: Negative MRA HEAD FINDINGS Carotid and vertebral arteries are symmetric. Less numerous right superior division MCA branches with chronic appearance. No acute branch occlusion. There is a high-grade right and moderate left P2 segment stenosis, likely atheromatous. 2 mm outpouching projecting inferiorly from the left supraclinoid ICA. No visible emanating vessel. IMPRESSION: Brain MRI: 1. 7 mm acute infarct in the left insula. Small area of subacute infarct in the left temporoparietal white matter. 2. Remote right cerebral and thalamic infarct with wallerian degeneration in the brainstem. 3. Atrophy and chronic small vessel ischemia. 4. Remote left craniotomy, reportedly for remote subdural hematoma evacuation Intracranial MRA: 1. No acute finding. 2. Paucity of right upper division MCA branches. 3. High-grade right and moderate left P2 segment stenoses. 4. 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 Electronically Signed   By: Marnee Spring M.D.   On: 06/04/2017 11:55   Ct Head Code Stroke Wo Contrast  Result Date: 06/03/2017 CLINICAL DATA:  Code stroke.  Slurred speech and left-sided weakness EXAM: CT HEAD WITHOUT CONTRAST TECHNIQUE: Contiguous axial images were obtained from the base of the skull through the vertex without intravenous contrast. COMPARISON:  08/20/2016 FINDINGS: Brain: Moderate atrophy. Chronic ischemic changes in the deep white matter on the right stable from the prior study. Chronic microvascular ischemia in the cerebral white matter bilaterally. Negative for acute infarct, hemorrhage, or mass lesion. Vascular: Negative for hyperdense vessel. Atherosclerotic calcification in the carotid artery bilaterally Skull: Chronic craniotomy on the left unchanged Sinuses/Orbits: Negative Other: None ASPECTS (Alberta Stroke Program Early CT Score) - Ganglionic level infarction (caudate, lentiform nuclei, internal capsule, insula,  M1-M3 cortex): 7 - Supraganglionic infarction (M4-M6 cortex): 3 Total score (0-10 with 10 being normal): 10 IMPRESSION: 1. No acute intracranial abnormality. 2. Atrophy and extensive chronic ischemic change. 3. ASPECTS is 10 These results were called by telephone at the  time of interpretation on 06/03/2017 at 5:15 pm to Dr. Merrily Brittle , who verbally acknowledged these results. Electronically Signed   By: Marlan Palau M.D.   On: 06/03/2017 17:15   ASSESSMENT AND PLAN:   Acute CVA with worsening of dysarthria, history of CVA. Per MRI brain, 7 mm acute infarct in the left insula. Small area of subacute infarct in the left temporoparietal white matter. Continue ASA, add plavix, add lipitor, hold Pravachol. Follow-up neurology consult. Follow-up PT and OT evaluation.  Sinus node dysfunction with junctional bradycardia Atropine 0.5 mg IV as needed  Per Dr. Kirke Corin, Dr. Graciela Husbands to review her case tomorrow morning to determine whether not pacing is appropriate. Avoid AV nodal blocking agents.  Essential hypertension blood pressure is elevated We will allow permissive hypertension while ruling out for stroke Continue her home medications hydralazine and enalapril and titrate as needed  CKD stage 3, stable. Tobacco abuse. Smoking cessation was counseled 4 minutes.  All the records are reviewed and case discussed with Care Management/Social Worker. Management plans discussed with the patient, family and they are in agreement.  CODE STATUS: Full Code  TOTAL TIME TAKING CARE OF THIS PATIENT: 36 minutes.   More than 50% of the time was spent in counseling/coordination of care: YES  POSSIBLE D/C IN 2 DAYS, DEPENDING ON CLINICAL CONDITION.   Shaune Pollack M.D on 06/04/2017 at 3:26 PM  Between 7am to 6pm - Pager - [phone]  After 6pm go to [email or web address] - Therapist, nutritional Hospitalists

## 2017-06-04 NOTE — Care Management (Addendum)
[facility] presented from [facility] with symptoms concerning for stroke and admitted with symptomatic bradycardia and has since ruled in for CVA. Physical therapy is recommending home health at discharge .  Will check with facility to inquire about home health agency preference

## 2017-06-04 NOTE — Consult Note (Addendum)
Referring Physician: Imogene Burn    Chief Complaint: Dysarthria    HPI: Joanna Stone is an 76 y.o. female with a known history CVA with left-sided weakness and dysarthria and dementia who was sent over from [place] [facility] for worsening of slurred speech. Patient reports that her speech has improved from initial onset.  Initial NIHSS of 6.  Date last known well: Date: 06/03/2017 Time last known well: Time: 14:00 tPA Given: No: Improvement in symptoms  Past Medical History:  Diagnosis Date  . Arthritis   . CKD (chronic kidney disease), stage III   . 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 History of brain surgery 2001  . History of depression   . Hypertension   . Sinus node dysfunction w/ competing jxnl rhythm    a. 07/2012 Ech: EF >55%, no rwma, mild MR/TR/AI, mildly dil LA.  . Tobacco abuse    a. 05/2017 currently smokes 2 cigarettes/day.  . Vascular dementia     Past Surgical History:  Procedure Laterality Date  . ABDOMINAL HYSTERECTOMY    . APPENDECTOMY    . TONSILLECTOMY      Family History  Problem Relation Age of Onset  . Family history unknown: Yes  Son and daughter deceased from cancer  Social History:  reports that she has been smoking Cigarettes.  She has been smoking about 1.00 pack per day for the past 0.00 years. She has never used smokeless tobacco. She reports that she does not drink alcohol or use drugs.  Allergies:  Allergies  Allergen Reactions  . Ultram [Tramadol]     vomiting    Medications:  I have reviewed the patient's current medications. Prior to Admission:  Prescriptions Prior to Admission  Medication Sig Dispense Refill Last Dose  . acetaminophen (MAPAP) 500 MG tablet Take 1,000 mg by mouth every 12 (twelve) hours. May take an additional every 6 hours if needed.   06/03/2017 at 0800  . aspirin 81 MG tablet Take 81 mg by mouth daily.   06/03/2017 at 0800  . Calcium Carbonate-Vitamin D (CALCIUM 600+D)  600-400 MG-UNIT per tablet Take 1 tablet by mouth daily.    06/03/2017 at 0800  . citalopram (CELEXA) 40 MG tablet Take 40 mg by mouth daily.   06/03/2017 at 0800  . enalapril (VASOTEC) 10 MG tablet Take 10 mg by mouth daily.   06/03/2017 at 0800  . ferrous sulfate 325 (65 FE) MG tablet Take 325 mg by mouth 2 (two) times daily with a meal.   06/03/2017 at 0800  . fexofenadine (ALLEGRA) 180 MG tablet Take 180 mg by mouth daily.   06/03/2017 at 0800  . gabapentin (NEURONTIN) 300 MG capsule Take 600 mg by mouth 3 (three) times daily.    06/03/2017 at 0800  . hydrALAZINE (APRESOLINE) 50 MG tablet Take 50 mg by mouth 3 (three) times daily.   06/03/2017 at 1400  . lovastatin (MEVACOR) 20 MG tablet Take 20 mg by mouth at bedtime.   06/02/2017 at 2000  . pantoprazole (PROTONIX) 20 MG tablet Take 20 mg by mouth daily.   06/03/2017 at 0800  . polyethylene glycol (MIRALAX / GLYCOLAX) packet Take 17 g by mouth daily.   06/03/2017 at 0800  . oxyCODONE-acetaminophen (PERCOCET) 5-325 MG tablet Take 2 tablets by mouth every 6 (six) hours as needed for moderate pain or severe pain. (Patient not taking: Reported on 06/03/2017) 12 tablet 0 Completed Course at Unknown time   Scheduled: .  acetaminophen  1,000 mg Oral Q12H  . [START ON 06/05/2017] aspirin  81 mg Oral Daily  . atorvastatin  40 mg Oral q1800  . calcium-vitamin D  1 tablet Oral Daily  . citalopram  40 mg Oral Daily  . clopidogrel  75 mg Oral Daily  . enalapril  10 mg Oral Daily  . enoxaparin (LOVENOX) injection  30 mg Subcutaneous Q24H  . ferrous sulfate  325 mg Oral BID WC  . gabapentin  600 mg Oral TID  . hydrALAZINE  50 mg Oral TID  . loratadine  10 mg Oral Daily  . pantoprazole  40 mg Oral Daily  . polyethylene glycol  17 g Oral Daily    ROS: History obtained from the patient  General ROS: negative for - chills, fatigue, fever, night sweats, weight gain or weight loss Psychological ROS: negative for - behavioral disorder, hallucinations, memory  difficulties, mood swings or suicidal ideation Ophthalmic ROS: negative for - blurry vision, double vision, eye pain or loss of vision ENT ROS: negative for - epistaxis, nasal discharge, oral lesions, sore throat, tinnitus or vertigo Allergy and Immunology ROS: negative for - hives or itchy/watery eyes Hematological and Lymphatic ROS: negative for - bleeding problems, bruising or swollen lymph nodes Endocrine ROS: negative for - galactorrhea, hair pattern changes, polydipsia/polyuria or temperature intolerance Respiratory ROS: negative for - cough, hemoptysis, shortness of breath or wheezing Cardiovascular ROS: negative for - chest pain, dyspnea on exertion, edema or irregular heartbeat Gastrointestinal ROS: negative for - abdominal pain, diarrhea, hematemesis, nausea/vomiting or stool incontinence Genito-Urinary ROS: negative for - dysuria, hematuria, incontinence or urinary frequency/urgency Musculoskeletal ROS: negative for - joint swelling or muscular weakness Neurological ROS: as noted in HPI Dermatological ROS: negative for rash and skin lesion changes  Physical Examination: Blood pressure (!) 161/105, pulse (!) 46, temperature 98.5 F (36.9 C), temperature source Oral, resp. rate 18, height 5' 0.1" (1.527 m), weight 60.8 kg (134 lb 1.6 oz), SpO2 98 %.  HEENT-  Normocephalic, no lesions, without obvious abnormality.  Normal external eye and conjunctiva.  Normal TM's bilaterally.  Normal auditory canals and external ears. Normal external nose, mucus membranes and septum.  Normal pharynx. Cardiovascular- S1, S2 normal, pulses palpable throughout   Lungs- chest clear, no wheezing, rales, normal symmetric air entry Abdomen- soft, non-tender; bowel sounds normal; no masses,  no organomegaly Extremities- no edema Lymph-no adenopathy palpable Musculoskeletal-no joint tenderness, deformity or swelling Skin-warm and dry, no hyperpigmentation, vitiligo, or suspicious lesions  Neurological  Examination   Mental Status: Alert, oriented, perseverative and easily agitated.  Speech fluent without evidence of aphasia.  Dysarthric.  Able to follow 3 step commands without difficulty. Cranial Nerves: II: Discs flat bilaterally; Visual fields grossly normal, pupils equal, round, reactive to light and accommodation III,IV, VI: ptosis not present, extra-ocular motions intact bilaterally V,VII: smile symmetric, facial light touch sensation normal bilaterally VIII: hearing normal bilaterally IX,X: gag reflex present XI: bilateral shoulder shrug XII: midline tongue extension Motor: Right : Upper extremity   5/5    Left:     Upper extremity   5/5 with 5-/5 hand grip  Lower extremity   5/5     Lower extremity   5-/5 Tone and bulk:normal tone throughout; no atrophy noted Sensory: Pinprick and light touch intact throughout, bilaterally Deep Tendon Reflexes: 2+ and symmetric with absent AJ's bilaterally Plantars: Right: upgoing   Left: upgoing Cerebellar: Normal finger-to-nose testing bilaterally Gait: not tested due to safety concerns    Laboratory Studies:  Basic Metabolic Panel:  Recent Labs Lab 06/03/17 1706  NA 139  K 4.8  CL 108  CO2 23  GLUCOSE 104*  BUN 37*  CREATININE 1.65*  CALCIUM 9.0    Liver Function Tests:  Recent Labs Lab 06/03/17 1706  AST 23  ALT 16  ALKPHOS 53  BILITOT 0.4  PROT 5.9*  ALBUMIN 3.4*   No results for input(s): LIPASE, AMYLASE in the last 168 hours. No results for input(s): AMMONIA in the last 168 hours.  CBC:  Recent Labs Lab 06/03/17 1706  WBC 7.2  NEUTROABS 5.5  HGB 12.5  HCT 36.6  MCV 96.5  PLT 190    Cardiac Enzymes:  Recent Labs Lab 06/03/17 1706  TROPONINI <0.03    BNP: Invalid input(s): POCBNP  CBG:  Recent Labs Lab 06/03/17 1702  GLUCAP 97    Microbiology: Results for orders placed or performed during the hospital encounter of 06/03/17  MRSA PCR Screening     Status: None   Collection Time:  06/03/17 11:00 PM  Result Value Ref Range Status   MRSA by PCR NEGATIVE NEGATIVE Final    Comment:        The GeneXpert MRSA Assay (FDA approved for NASAL specimens only), is one component of a comprehensive MRSA colonization surveillance program. It is not intended to diagnose MRSA infection nor to guide or monitor treatment for MRSA infections.     Coagulation Studies:  Recent Labs  06/03/17 1706  LABPROT 12.6  INR 0.95    Urinalysis:  Recent Labs Lab 06/03/17 1706  COLORURINE YELLOW  LABSPEC 1.015  PHURINE 5.5  GLUCOSEU NEGATIVE  HGBUR NEGATIVE  BILIRUBINUR NEGATIVE  KETONESUR NEGATIVE  PROTEINUR NEGATIVE  NITRITE NEGATIVE  LEUKOCYTESUR SMALL*    Lipid Panel:    Component Value Date/Time   CHOL 118 06/04/2017 0452   TRIG 76 06/04/2017 0452   HDL 39 (L) 06/04/2017 0452   CHOLHDL 3.0 06/04/2017 0452   VLDL 15 06/04/2017 0452   LDLCALC 64 06/04/2017 0452    HgbA1C:  Lab Results  Component Value Date   HGBA1C 5.1 06/04/2017    Urine Drug Screen:  No results found for: LABOPIA, COCAINSCRNUR, LABBENZ, AMPHETMU, THCU, LABBARB  Alcohol Level:  Recent Labs Lab 06/03/17 1706  ETH <5    Other results: EKG: sinus bradycardia with sinus arrest and junctional escape rhythm.  Imaging: Mr Brain Wo Contrast  Result Date: 06/04/2017 CLINICAL DATA:  Worsening slurred speech. Chronic left-sided weakness and dysarthria due to old stroke. EXAM: MRI HEAD WITHOUT CONTRAST MRA HEAD WITHOUT CONTRAST TECHNIQUE: Multiplanar, multiecho pulse sequences of the brain and surrounding structures were obtained without intravenous contrast. Angiographic images of the head were obtained using MRA technique without contrast. COMPARISON:  Head CT from yesterday FINDINGS: MRI HEAD FINDINGS Brain: Subcentimeter acute infarct in the left insula. There is a small area of elongated more weakly restricted diffusion in the left periatrial white matter. No acute hemorrhage, hydrocephalus,  or masslike finding. Remote right cerebral white matter infarct affecting the deep white matter tracts and lateral thalamus, with wallerian changes seen throughout the brainstem. There is gliosis in the left temporal operculum and temporal pole which could be post ischemic or from history prior hemorrhage in surgery. Periventricular chronic microvascular ischemic gliosis in the cerebral white matter. Vascular: Arterial findings below. Normal dural venous sinus flow voids. Skull and upper cervical spine: Left C2-3 facet arthropathy. Previous left craniotomy, reportedly for subdural hematoma evacuation per 2003 head CT report. Sinuses/Orbits: Negative  MRA HEAD FINDINGS Carotid and vertebral arteries are symmetric. Less numerous right superior division MCA branches with chronic appearance. No acute branch occlusion. There is a high-grade right and moderate left P2 segment stenosis, likely atheromatous. 2 mm outpouching projecting inferiorly from the left supraclinoid ICA. No visible emanating vessel. IMPRESSION: Brain MRI: 1. 7 mm acute infarct in the left insula. Small area of subacute infarct in the left temporoparietal white matter. 2. Remote right cerebral and thalamic infarct with wallerian degeneration in the brainstem. 3. Atrophy and chronic small vessel ischemia. 4. Remote left craniotomy, reportedly for remote subdural hematoma evacuation Intracranial MRA: 1. No acute finding. 2. Paucity of right upper division MCA branches. 3. High-grade right and moderate left P2 segment stenoses. 4. 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 Electronically Signed   By: Marnee Spring M.D.   On: 06/04/2017 11:55   US Carotid Bilateral (at [facility] And [facility] Only)  Result Date: 06/04/2017 CLINICAL DATA:  76 year old female with a history of TIA. Cardiovascular risk factors include hypertension, known prior stroke/TIA, tobacco use EXAM: BILATERAL CAROTID DUPLEX ULTRASOUND TECHNIQUE: Wallace Cullens scale imaging, color  Doppler and duplex ultrasound were performed of bilateral carotid and vertebral arteries in the neck. COMPARISON:  No prior duplex FINDINGS: Criteria: Quantification of carotid stenosis is based on velocity parameters that correlate the residual internal carotid diameter with NASCET-based stenosis levels, using the diameter of the distal internal carotid lumen as the denominator for stenosis measurement. The following velocity measurements were obtained: RIGHT ICA:  Systolic 126 cm/sec, Diastolic 24 cm/sec CCA:  107 cm/sec SYSTOLIC ICA/CCA RATIO:  1.2 ECA:  271 cm/sec LEFT ICA:  Systolic 149 cm/sec, Diastolic 25 cm/sec CCA:  106 cm/sec SYSTOLIC ICA/CCA RATIO:  1.4 ECA:  123 cm/sec Right Brachial SBP: Not acquired Left Brachial SBP: Not acquired RIGHT CAROTID ARTERY: No significant calcifications of the right common carotid artery. Intermediate waveform maintained. Heterogeneous and partially calcified plaque at the right carotid bifurcation. No significant lumen shadowing. Low resistance waveform of the right ICA. Mild tortuosity RIGHT VERTEBRAL ARTERY: Antegrade flow with low resistance waveform. LEFT CAROTID ARTERY: No significant calcifications of the left common carotid artery. Intermediate waveform maintained. Heterogeneous and partially calcified plaque at the left carotid bifurcation without significant lumen shadowing. Low resistance waveform of the left ICA. Mild tortuosity LEFT VERTEBRAL ARTERY:  Antegrade flow with low resistance waveform. Additional:  Irregular heart rate. IMPRESSION: Right: Color duplex indicates minimal heterogeneous and calcified plaque, with no hemodynamically significant stenosis by duplex criteria in the right extracranial cerebrovascular circulation. Left: Heterogeneous plaque at the carotid bifurcation, with discordant results regarding degree of stenosis by established duplex criteria. Peak velocity suggests 50% -69% stenosis, with the ICA/ CCA ratio suggesting a lesser degree  of stenosis. If establishing a more accurate degree of stenosis is required, cerebral angiogram should be considered, or as a second best test, CTA. Note that there is irregular heart rate on the duplex exam, potentially atrial fibrillation. Signed, Yvone Neu. Loreta Ave, DO [facility] Radiology Electronically Signed   By: Gilmer Mor D.O.   On: 06/04/2017 12:30   Dg Chest Port 1 View  Result Date: 06/03/2017 CLINICAL DATA:  Patient is poor historian at this time. Encounter for bradycardia. Hx HTN, CVA. Current smoker. EXAM: PORTABLE CHEST 1 VIEW COMPARISON:  07/31/2012 FINDINGS: Stable enlarged cardiac silhouette. No pulmonary edema or pleural fluid. No infiltrate. No pneumothorax. No acute osseous abnormality. IMPRESSION: Cardiomegaly without acute findings. Electronically Signed  By: Genevive Bi M.D.   On: 06/03/2017 17:50   Mr Maxine Glenn Head/brain ZO Cm  Result Date: 06/04/2017 CLINICAL DATA:  Worsening slurred speech. Chronic left-sided weakness and dysarthria due to old stroke. EXAM: MRI HEAD WITHOUT CONTRAST MRA HEAD WITHOUT CONTRAST TECHNIQUE: Multiplanar, multiecho pulse sequences of the brain and surrounding structures were obtained without intravenous contrast. Angiographic images of the head were obtained using MRA technique without contrast. COMPARISON:  Head CT from yesterday FINDINGS: MRI HEAD FINDINGS Brain: Subcentimeter acute infarct in the left insula. There is a small area of elongated more weakly restricted diffusion in the left periatrial white matter. No acute hemorrhage, hydrocephalus, or masslike finding. Remote right cerebral white matter infarct affecting the deep white matter tracts and lateral thalamus, with wallerian changes seen throughout the brainstem. There is gliosis in the left temporal operculum and temporal pole which could be post ischemic or from history prior hemorrhage in surgery. Periventricular chronic microvascular  ischemic gliosis in the cerebral white matter. Vascular: Arterial findings below. Normal dural venous sinus flow voids. Skull and upper cervical spine: Left C2-3 facet arthropathy. Previous left craniotomy, reportedly for subdural hematoma evacuation per 2003 head CT report. Sinuses/Orbits: Negative MRA HEAD FINDINGS Carotid and vertebral arteries are symmetric. Less numerous right superior division MCA branches with chronic appearance. No acute branch occlusion. There is a high-grade right and moderate left P2 segment stenosis, likely atheromatous. 2 mm outpouching projecting inferiorly from the left supraclinoid ICA. No visible emanating vessel. IMPRESSION: Brain MRI: 1. 7 mm acute infarct in the left insula. Small area of subacute infarct in the left temporoparietal white matter. 2. Remote right cerebral and thalamic infarct with wallerian degeneration in the brainstem. 3. Atrophy and chronic small vessel ischemia. 4. Remote left craniotomy, reportedly for remote subdural hematoma evacuation Intracranial MRA: 1. No acute finding. 2. Paucity of right upper division MCA branches. 3. High-grade right and moderate left P2 segment stenoses. 4. 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 Electronically Signed   By: Marnee Spring M.D.   On: 06/04/2017 11:55   Ct Head Code Stroke Wo Contrast  Result Date: 06/03/2017 CLINICAL DATA:  Code stroke.  Slurred speech and left-sided weakness EXAM: CT HEAD WITHOUT CONTRAST TECHNIQUE: Contiguous axial images were obtained from the base of the skull through the vertex without intravenous contrast. COMPARISON:  08/20/2016 FINDINGS: Brain: Moderate atrophy. Chronic ischemic changes in the deep white matter on the right stable from the prior study. Chronic microvascular ischemia in the cerebral white matter bilaterally. Negative for acute infarct, hemorrhage, or mass lesion. Vascular: Negative for hyperdense vessel. Atherosclerotic calcification in the carotid  artery bilaterally Skull: Chronic craniotomy on the left unchanged Sinuses/Orbits: Negative Other: None ASPECTS (Alberta Stroke Program Early CT Score) - Ganglionic level infarction (caudate, lentiform nuclei, internal capsule, insula, M1-M3 cortex): 7 - Supraganglionic infarction (M4-M6 cortex): 3 Total score (0-10 with 10 being normal): 10 IMPRESSION: 1. No acute intracranial abnormality. 2. Atrophy and extensive chronic ischemic change. 3. ASPECTS is 10 These results were called by telephone at the time of interpretation on 06/03/2017 at 5:15 pm to Dr. Merrily Brittle , who verbally acknowledged these results. Electronically Signed   By: Marlan Palau M.D.   On: 06/03/2017 17:15    Assessment: 76 y.o. female presenting with worsening dysarthria.  MRI of the brain shows a left insular and left white matter temporoparietal acute infarcts.  Suspect embolic etiology.  MRA shows decrease in left MCA branches and high grade right  P2 stenosis.  Carotid dopplers show no evidence of hemodynamically significant stenosis on the right, left with 50-69% stenosis.  Echocardiogram shows no cardiac source of emboli with an EF of 55-60%.  A1c 5.1, LDL 64.  On ASA prior to admission.  Stroke Risk Factors - hypertension and smoking  Plan: 1. PT consult, OT consult, Speech consult 2. Prophylactic therapy-ASA 81mg  and Plavix 75mg  daily 3. NPO until RN stroke swallow screen 4. Telemetry monitoring 5. Frequent neuro checks 6. CTA of the head and neck 7. Smoking cessation counseling   Thana Farr, MD Neurology [phone] 06/04/2017, 8:38 PM

## 2017-06-04 NOTE — Progress Notes (Signed)
*  PRELIMINARY RESULTS* Echocardiogram 2D Echocardiogram has been performed.  Cristela Blue 06/04/2017, 9:27 AM

## 2017-06-04 NOTE — Progress Notes (Signed)
Pauses getting longer and longer and frequent with the longest pause 3.51 seconds. HR sometimes dips under 30 s. Patient is asymptomatic on reassessment. Dr. Anne Hahn notified with a new order to administer the PRN atropine 0.5 mg Iv at this time. Will give the atropine and continue to monitor.

## 2017-06-05 DIAGNOSIS — I6522 Occlusion and stenosis of left carotid artery: Secondary | ICD-10-CM

## 2017-06-05 DIAGNOSIS — I498 Other specified cardiac arrhythmias: Secondary | ICD-10-CM

## 2017-06-05 DIAGNOSIS — N189 Chronic kidney disease, unspecified: Secondary | ICD-10-CM

## 2017-06-05 MED ORDER — SODIUM CHLORIDE 0.9 % IV SOLN
INTRAVENOUS | Status: AC
Start: 2017-06-05 — End: 2017-06-06
  Administered 2017-06-05 – 2017-06-06 (×2): via INTRAVENOUS

## 2017-06-05 NOTE — Progress Notes (Signed)
CCMD called to notify that pt. Experienced 4 sec. Occurrence of asystole, upon assessment pt. Asymptomatic, MD End notified, stated no further interventions necessary at this time. Will continue to monitor

## 2017-06-05 NOTE — Progress Notes (Addendum)
[employer] - [place] [facility]   PATIENT NAME: Joanna Stone    MR#:  [id number]  DATE OF BIRTH:  11-19-1940  SUBJECTIVE:  CHIEF COMPLAINT:   Chief Complaint  Patient presents with  . Code Stroke   Generalized weakness REVIEW OF SYSTEMS:  Review of Systems  Constitutional: Positive for malaise/fatigue. Negative for chills and fever.  HENT: Negative for sore throat.   Eyes: Negative for blurred vision and double vision.  Respiratory: Negative for cough, hemoptysis, shortness of breath, wheezing and stridor.   Cardiovascular: Negative for chest pain, palpitations, orthopnea and leg swelling.  Gastrointestinal: Negative for abdominal pain, blood in stool, diarrhea, melena, nausea and vomiting.  Genitourinary: Negative for dysuria, flank pain and hematuria.  Musculoskeletal: Negative for back pain and joint pain.  Neurological: Positive for speech change and weakness. Negative for dizziness, sensory change, focal weakness, seizures, loss of consciousness and headaches.  Endo/Heme/Allergies: Negative for polydipsia.  Psychiatric/Behavioral: Negative for depression. The patient is not nervous/anxious.     DRUG ALLERGIES:   Allergies  Allergen Reactions  . Ultram [Tramadol]     vomiting   VITALS:  Blood pressure (!) 162/58, pulse (!) 53, temperature 98.1 F (36.7 C), resp. rate 18, height 5' 0.1" (1.527 m), weight 134 lb 1.6 oz (60.8 kg), SpO2 95 %. PHYSICAL EXAMINATION:  Physical Exam  Constitutional: She is oriented to person, place, and time and well-developed, well-nourished, and in no distress.  HENT:  Head: Normocephalic.  Mouth/Throat: Oropharynx is clear and moist.  Eyes: Pupils are equal, round, and reactive to light. Conjunctivae and EOM are normal. No scleral icterus.  Neck: Normal range of motion. Neck supple. No JVD present. No tracheal deviation present.  Cardiovascular: Normal rate, regular rhythm and normal heart sounds.  Exam reveals  no gallop.   No murmur heard. Pulmonary/Chest: Effort normal and breath sounds normal. No respiratory distress. She has no wheezes. She has no rales.  Abdominal: Soft. Bowel sounds are normal. She exhibits no distension. There is no tenderness. There is no rebound.  Musculoskeletal: Normal range of motion. She exhibits no edema or tenderness.  Neurological: She is alert and oriented to person, place, and time.  Dysarthria, mild left facial droop Power 0/5 in left arm, 4/5 in right arm, 3/5 in both legs.  Skin: No rash noted. No erythema.  Psychiatric: Affect normal.   LABORATORY PANEL:  Female CBC  Recent Labs Lab 06/03/17 1706  WBC 7.2  HGB 12.5  HCT 36.6  PLT 190   ------------------------------------------------------------------------------------------------------------------ Chemistries   Recent Labs Lab 06/03/17 1706  NA 139  K 4.8  CL 108  CO2 23  GLUCOSE 104*  BUN 37*  CREATININE 1.65*  CALCIUM 9.0  AST 23  ALT 16  ALKPHOS 53  BILITOT 0.4   RADIOLOGY:  No results found. ASSESSMENT AND PLAN:   Acute CVA with worsening of dysarthria, history of CVA. Per MRI brain, 7 mm acute infarct in the left insula. Small area of subacute infarct in the left temporoparietal white matter. Continue ASA, added plavix and lipitor, hold Pravachol.  Per Dr. Thad Ranger, neurology consult, CTA of the head and neck. Since patient's GFR is less than 30, cannot get CTA. Start IV fluid and follow-up renal function per Dr. Thad Ranger.  PT and OT evaluation: need HHPT.  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 Vascular surgery consult is pending.  Sinus node dysfunction with junctional bradycardia Atropine 0.5 mg IV as needed  Per Dr. Kirke Corin, Dr. Graciela Husbands to  review her case tomorrow morning to determine whether not pacing is appropriate. Avoid AV nodal blocking agents.  Essential hypertension blood pressure is elevated We will allow permissive hypertension while ruling out for stroke Continue  her home medications hydralazine and enalapril and titrate as needed  CKD stage 3, stable. Start NS iv, follow-up BMP. Tobacco abuse. Smoking cessation was counseled 4 minutes.  I discussed with Dr. Thad Ranger. All the records are reviewed and case discussed with Care Management/Social Worker. Management plans discussed with the patient, family and they are in agreement.  CODE STATUS: Full Code  TOTAL TIME TAKING CARE OF THIS PATIENT: 36 minutes.   More than 50% of the time was spent in counseling/coordination of care: YES  POSSIBLE D/C IN 2 DAYS, DEPENDING ON CLINICAL CONDITION.   Shaune Pollack M.D on 06/05/2017 at 2:43 PM  Between 7am to 6pm - Pager - [phone]  After 6pm go to [email or web address] - Therapist, nutritional Hospitalists

## 2017-06-05 NOTE — Consult Note (Signed)
[facility] Vascular Consult Note  MRN : [id number]  Joanna Stone is a 76 y.o. (16-Jul-1941) female who presents with chief complaint of  Chief Complaint  Patient presents with  . Code Stroke  .  History of Present Illness: I am asked to evaluate the patient by Dr. Imogene Burn.  The patient is a 76 year old woman with a previous history of cerebrovascular accident and associated left-sided weakness. She presented from her skilled nursing facility approximately 2 days ago with sudden onset of dysarthria and aphasia. She was admitted to [facility] with chronic bradycardia at a heart rate documented in the ED in the 30s. Subsequent workup has demonstrated to acute infarcts in the left hemisphere. Over the course of the past 36 hours her a fascia has largely resolved and on my interview this evening she is fairly fluent in her responses.  Patient denies amaurosis fugax or recent changes in her arm or leg motor sensory.  As a baseline she has chronic renal insufficiency with a creatinine clearance of approximately 30.  Current Facility-Administered Medications  Medication Dose Route Frequency Provider Last Rate Last Dose  . 0.9 %  sodium chloride infusion   Intravenous Continuous Shaune Pollack, MD 75 mL/hr at 06/05/17 1402    . acetaminophen (TYLENOL) tablet 650 mg  650 mg Oral Q4H PRN Gouru, Aruna, MD       Or  . acetaminophen (TYLENOL) solution 650 mg  650 mg Per Tube Q4H PRN Gouru, Aruna, MD       Or  . acetaminophen (TYLENOL) suppository 650 mg  650 mg Rectal Q4H PRN Gouru, Aruna, MD      . acetaminophen (TYLENOL) tablet 1,000 mg  1,000 mg Oral Q12H Gouru, Aruna, MD   1,000 mg at 06/05/17 0915  . aspirin chewable tablet 81 mg  81 mg Oral Daily Shaune Pollack, MD   81 mg at 06/05/17 7829  . atorvastatin (LIPITOR) tablet 40 mg  40 mg Oral q1800 Shaune Pollack, MD   40 mg at 06/05/17 1753  . atropine 1 MG/10ML injection 0.5 mg  0.5 mg Intravenous PRN Gouru, Aruna, MD   0.5 mg  at 06/04/17 0204  . calcium-vitamin D (OSCAL WITH D) 500-200 MG-UNIT per tablet 1 tablet  1 tablet Oral Daily Gouru, Aruna, MD   1 tablet at 06/05/17 0915  . citalopram (CELEXA) tablet 40 mg  40 mg Oral Daily Gouru, Aruna, MD   40 mg at 06/05/17 0916  . clopidogrel (PLAVIX) tablet 75 mg  75 mg Oral Daily Shaune Pollack, MD   75 mg at 06/05/17 0915  . enalapril (VASOTEC) tablet 10 mg  10 mg Oral Daily Gouru, Aruna, MD   10 mg at 06/05/17 0916  . enoxaparin (LOVENOX) injection 30 mg  30 mg Subcutaneous Q24H Gouru, Aruna, MD   30 mg at 06/04/17 2127  . ferrous sulfate tablet 325 mg  325 mg Oral BID WC Gouru, Aruna, MD   325 mg at 06/05/17 1635  . gabapentin (NEURONTIN) capsule 600 mg  600 mg Oral TID Gouru, Aruna, MD   600 mg at 06/05/17 1634  . hydrALAZINE (APRESOLINE) injection 10 mg  10 mg Intravenous Q6H PRN Gouru, Deanna Artis, MD   10 mg at 06/03/17 1838  . hydrALAZINE (APRESOLINE) tablet 50 mg  50 mg Oral TID Ramonita Lab, MD   50 mg at 06/05/17 1600  . loratadine (CLARITIN) tablet 10 mg  10 mg Oral Daily Gouru, Aruna, MD   10 mg at  06/05/17 0916  . oxyCODONE-acetaminophen (PERCOCET/ROXICET) 5-325 MG per tablet 2 tablet  2 tablet Oral Q6H PRN Gouru, Aruna, MD      . pantoprazole (PROTONIX) EC tablet 40 mg  40 mg Oral Daily Shaune Pollack, MD   40 mg at 06/05/17 1000  . polyethylene glycol (MIRALAX / GLYCOLAX) packet 17 g  17 g Oral Daily Gouru, Aruna, MD   17 g at 06/05/17 0919  . senna-docusate (Senokot-S) tablet 1 tablet  1 tablet Oral QHS PRN Ramonita Lab, MD        Past Medical History:  Diagnosis Date  . Arthritis   . CKD (chronic kidney disease), stage III   . 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 History of brain surgery 2001  . History of depression   . Hypertension   . Sinus node dysfunction w/ competing jxnl rhythm    a. 07/2012 Ech: EF >55%, no rwma, mild MR/TR/AI, mildly dil LA.  . Tobacco abuse    a. 05/2017 currently smokes 2 cigarettes/day.  . Vascular dementia      Past Surgical History:  Procedure Laterality Date  . ABDOMINAL HYSTERECTOMY    . APPENDECTOMY    . TONSILLECTOMY      Social History Social History  Substance Use Topics  . Smoking status: Current Every Day Smoker    Packs/day: 1.00    Years: 0.00    Types: Cigarettes  . Smokeless tobacco: Never Used     Comment: currently smoking 2 cigarettes/day  . Alcohol use No    Family History Family History  Problem Relation Age of Onset  . Family history unknown: Yes  No family history of bleeding/clotting disorders, porphyria or autoimmune disease   Allergies  Allergen Reactions  . Ultram [Tramadol]     vomiting     REVIEW OF SYSTEMS (Negative unless checked)  Constitutional: Weight loss  Fever  Chills Cardiac: Chest pain   Chest pressure   Palpitations   Shortness of breath when laying flat   Shortness of breath at rest   Shortness of breath with exertion. Vascular:  Pain in legs with walking   Pain in legs at rest   Pain in legs when laying flat   Claudication   Pain in feet when walking  Pain in feet at rest  Pain in feet when laying flat   History of DVT   Phlebitis   Swelling in legs   Varicose veins   Non-healing ulcers Pulmonary:   Uses home oxygen   Productive cough   Hemoptysis   Wheeze  COPD   Asthma Neurologic:  Dizziness  Blackouts   Seizures   History of stroke   History of TIA  Aphasia   Temporary blindness   Dysphagia   Weakness or numbness in arms   Weakness or numbness in legs Musculoskeletal:  Arthritis   Joint swelling   Joint pain   Low back pain Hematologic:  Easy bruising  Easy bleeding   Hypercoagulable state   Anemic  Hepatitis Gastrointestinal:  Blood in stool   Vomiting blood  Gastroesophageal reflux/heartburn   Difficulty swallowing. Genitourinary:  Chronic kidney disease   Difficult urination  Frequent urination  Burning with urination    Blood in urine Skin:  Rashes   Ulcers   Wounds Psychological:  History of anxiety    History of major depression.  Physical Examination  Vitals:   06/05/17 1130 06/05/17 1214 06/05/17 1508 06/05/17 1632  BP: (!) 187/67 Marland Kitchen)  162/58  (!) 162/74  Pulse: 61 (!) 53 (!) 52   Resp: 18     Temp: 98.1 F (36.7 C)     TempSrc:      SpO2: 95%  98%   Weight:      Height:       Body mass index is 26.1 kg/m. Gen:  WD/WN, NAD Head: [place]/AT, No temporalis wasting. Prominent temp pulse not noted. Ear/Nose/Throat: Hearing grossly intact, nares w/o erythema or drainage, oropharynx w/o Erythema/Exudate Eyes: Sclera non-icteric, conjunctiva clear Neck: Trachea midline.  No JVD.  Pulmonary:  Good air movement, respirations not labored, equal bilaterally.  Cardiac: RRR, normal S1, S2. Vascular:  Vessel Right Left  Radial Palpable Palpable  Ulnar Palpable Palpable  Brachial Palpable Palpable  Carotid Palpable, without bruit Palpable, without bruit  Aorta Not palpable N/A  Femoral Palpable Palpable  Popliteal Palpable Palpable  PT Palpable Palpable  DP Palpable Palpable   Gastrointestinal: soft, non-tender/non-distended. No guarding/reflex.  Musculoskeletal: M/S 5/5 throughout right side and 3-4/5 left side.  Extremities without ischemic changes.  No deformity or atrophy. No edema. Neurologic: Sensation grossly intact in extremities.  Symmetrical.  Speech is fluent. Motor exam as listed above. Psychiatric: Judgment intact, Mood & affect appropriate for pt's clinical situation. Dermatologic: No rashes or ulcers noted.  No cellulitis or open wounds. Lymph : No Cervical, Axillary, or Inguinal lymphadenopathy.      CBC Lab Results  Component Value Date   WBC 7.2 06/03/2017   HGB 12.5 06/03/2017   HCT 36.6 06/03/2017   MCV 96.5 06/03/2017   PLT 190 06/03/2017    BMET    Component Value Date/Time   NA 139 06/03/2017 1706   NA 140 08/01/2012 0358   K 4.8 06/03/2017 1706   K  4.6 08/01/2012 0358   CL 108 06/03/2017 1706   CL 111 (H) 08/01/2012 0358   CO2 23 06/03/2017 1706   CO2 21 08/01/2012 0358   GLUCOSE 104 (H) 06/03/2017 1706   GLUCOSE 94 08/01/2012 0358   BUN 37 (H) 06/03/2017 1706   BUN 29 (H) 08/01/2012 0358   CREATININE 1.65 (H) 06/03/2017 1706   CREATININE 1.50 (H) 08/01/2012 0358   CALCIUM 9.0 06/03/2017 1706   CALCIUM 8.8 08/01/2012 0358   GFRNONAA 29 (L) 06/03/2017 1706   GFRNONAA 35 (L) 08/01/2012 0358   GFRAA 34 (L) 06/03/2017 1706   GFRAA 40 (L) 08/01/2012 0358   Estimated Creatinine Clearance: 23.7 mL/min (A) (by C-G formula based on SCr of 1.65 mg/dL (H)).  COAG Lab Results  Component Value Date   INR 0.95 06/03/2017   INR 0.9 07/31/2012    Radiology Mr Brain Wo Contrast  Result Date: 06/04/2017 CLINICAL DATA:  Worsening slurred speech. Chronic left-sided weakness and dysarthria due to old stroke. EXAM: MRI HEAD WITHOUT CONTRAST MRA HEAD WITHOUT CONTRAST TECHNIQUE: Multiplanar, multiecho pulse sequences of the brain and surrounding structures were obtained without intravenous contrast. Angiographic images of the head were obtained using MRA technique without contrast. COMPARISON:  Head CT from yesterday FINDINGS: MRI HEAD FINDINGS Brain: Subcentimeter acute infarct in the left insula. There is a small area of elongated more weakly restricted diffusion in the left periatrial white matter. No acute hemorrhage, hydrocephalus, or masslike finding. Remote right cerebral white matter infarct affecting the deep white matter tracts and lateral thalamus, with wallerian changes seen throughout the brainstem. There is gliosis in the left temporal operculum and temporal pole which could be post ischemic or from history prior hemorrhage  in surgery. Periventricular chronic microvascular ischemic gliosis in the cerebral white matter. Vascular: Arterial findings below. Normal dural venous sinus flow voids. Skull and upper cervical spine: Left C2-3 facet  arthropathy. Previous left craniotomy, reportedly for subdural hematoma evacuation per 2003 head CT report. Sinuses/Orbits: Negative MRA HEAD FINDINGS Carotid and vertebral arteries are symmetric. Less numerous right superior division MCA branches with chronic appearance. No acute branch occlusion. There is a high-grade right and moderate left P2 segment stenosis, likely atheromatous. 2 mm outpouching projecting inferiorly from the left supraclinoid ICA. No visible emanating vessel. IMPRESSION: Brain MRI: 1. 7 mm acute infarct in the left insula. Small area of subacute infarct in the left temporoparietal white matter. 2. Remote right cerebral and thalamic infarct with wallerian degeneration in the brainstem. 3. Atrophy and chronic small vessel ischemia. 4. Remote left craniotomy, reportedly for remote subdural hematoma evacuation Intracranial MRA: 1. No acute finding. 2. Paucity of right upper division MCA branches. 3. High-grade right and moderate left P2 segment stenoses. 4. 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 Electronically Signed   By: Marnee Spring M.D.   On: 06/04/2017 11:55   US Carotid Bilateral (at [facility] Only)  Result Date: 06/04/2017 CLINICAL DATA:  76 year old female with a history of TIA. Cardiovascular risk factors include hypertension, known prior stroke/TIA, tobacco use EXAM: BILATERAL CAROTID DUPLEX ULTRASOUND TECHNIQUE: Wallace Cullens scale imaging, color Doppler and duplex ultrasound were performed of bilateral carotid and vertebral arteries in the neck. COMPARISON:  No prior duplex FINDINGS: Criteria: Quantification of carotid stenosis is based on velocity parameters that correlate the residual internal carotid diameter with NASCET-based stenosis levels, using the diameter of the distal internal carotid lumen as the denominator for stenosis measurement. The following velocity measurements were obtained: RIGHT ICA:  Systolic 126 cm/sec, Diastolic 24 cm/sec CCA:  107  cm/sec SYSTOLIC ICA/CCA RATIO:  1.2 ECA:  271 cm/sec LEFT ICA:  Systolic 149 cm/sec, Diastolic 25 cm/sec CCA:  106 cm/sec SYSTOLIC ICA/CCA RATIO:  1.4 ECA:  123 cm/sec Right Brachial SBP: Not acquired Left Brachial SBP: Not acquired RIGHT CAROTID ARTERY: No significant calcifications of the right common carotid artery. Intermediate waveform maintained. Heterogeneous and partially calcified plaque at the right carotid bifurcation. No significant lumen shadowing. Low resistance waveform of the right ICA. Mild tortuosity RIGHT VERTEBRAL ARTERY: Antegrade flow with low resistance waveform. LEFT CAROTID ARTERY: No significant calcifications of the left common carotid artery. Intermediate waveform maintained. Heterogeneous and partially calcified plaque at the left carotid bifurcation without significant lumen shadowing. Low resistance waveform of the left ICA. Mild tortuosity LEFT VERTEBRAL ARTERY:  Antegrade flow with low resistance waveform. Additional:  Irregular heart rate. IMPRESSION: Right: Color duplex indicates minimal heterogeneous and calcified plaque, with no hemodynamically significant stenosis by duplex criteria in the right extracranial cerebrovascular circulation. Left: Heterogeneous plaque at the carotid bifurcation, with discordant results regarding degree of stenosis by established duplex criteria. Peak velocity suggests 50% -69% stenosis, with the ICA/ CCA ratio suggesting a lesser degree of stenosis. If establishing a more accurate degree of stenosis is required, cerebral angiogram should be considered, or as a second best test, CTA. Note that there is irregular heart rate on the duplex exam, potentially atrial fibrillation. Signed, Yvone Neu. Loreta Ave, DO [facility] Electronically Signed   By: Gilmer Mor D.O.   On: 06/04/2017 12:30   Dg Chest Port 1 View  Result Date: 06/03/2017 CLINICAL DATA:  Patient is poor historian at  this time.  Encounter for bradycardia. Hx HTN, CVA. Current smoker. EXAM: PORTABLE CHEST 1 VIEW COMPARISON:  07/31/2012 FINDINGS: Stable enlarged cardiac silhouette. No pulmonary edema or pleural fluid. No infiltrate. No pneumothorax. No acute osseous abnormality. IMPRESSION: Cardiomegaly without acute findings. Electronically Signed   By: Genevive Bi M.D.   On: 06/03/2017 17:50   Mr Maxine Glenn Head/brain ZO Cm  Result Date: 06/04/2017 CLINICAL DATA:  Worsening slurred speech. Chronic left-sided weakness and dysarthria due to old stroke. EXAM: MRI HEAD WITHOUT CONTRAST MRA HEAD WITHOUT CONTRAST TECHNIQUE: Multiplanar, multiecho pulse sequences of the brain and surrounding structures were obtained without intravenous contrast. Angiographic images of the head were obtained using MRA technique without contrast. COMPARISON:  Head CT from yesterday FINDINGS: MRI HEAD FINDINGS Brain: Subcentimeter acute infarct in the left insula. There is a small area of elongated more weakly restricted diffusion in the left periatrial white matter. No acute hemorrhage, hydrocephalus, or masslike finding. Remote right cerebral white matter infarct affecting the deep white matter tracts and lateral thalamus, with wallerian changes seen throughout the brainstem. There is gliosis in the left temporal operculum and temporal pole which could be post ischemic or from history prior hemorrhage in surgery. Periventricular chronic microvascular ischemic gliosis in the cerebral white matter. Vascular: Arterial findings below. Normal dural venous sinus flow voids. Skull and upper cervical spine: Left C2-3 facet arthropathy. Previous left craniotomy, reportedly for subdural hematoma evacuation per 2003 head CT report. Sinuses/Orbits: Negative MRA HEAD FINDINGS Carotid and vertebral arteries are symmetric. Less numerous right superior division MCA branches with chronic appearance. No acute branch occlusion. There is a high-grade right and moderate left P2  segment stenosis, likely atheromatous. 2 mm outpouching projecting inferiorly from the left supraclinoid ICA. No visible emanating vessel. IMPRESSION: Brain MRI: 1. 7 mm acute infarct in the left insula. Small area of subacute infarct in the left temporoparietal white matter. 2. Remote right cerebral and thalamic infarct with wallerian degeneration in the brainstem. 3. Atrophy and chronic small vessel ischemia. 4. Remote left craniotomy, reportedly for remote subdural hematoma evacuation Intracranial MRA: 1. No acute finding. 2. Paucity of right upper division MCA branches. 3. High-grade right and moderate left P2 segment stenoses. 4. 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 Electronically Signed   By: Marnee Spring M.D.   On: 06/04/2017 11:55   Ct Head Code Stroke Wo Contrast  Result Date: 06/03/2017 CLINICAL DATA:  Code stroke.  Slurred speech and left-sided weakness EXAM: CT HEAD WITHOUT CONTRAST TECHNIQUE: Contiguous axial images were obtained from the base of the skull through the vertex without intravenous contrast. COMPARISON:  08/20/2016 FINDINGS: Brain: Moderate atrophy. Chronic ischemic changes in the deep white matter on the right stable from the prior study. Chronic microvascular ischemia in the cerebral white matter bilaterally. Negative for acute infarct, hemorrhage, or mass lesion. Vascular: Negative for hyperdense vessel. Atherosclerotic calcification in the carotid artery bilaterally Skull: Chronic craniotomy on the left unchanged Sinuses/Orbits: Negative Other: None ASPECTS (Alberta Stroke Program Early CT Score) - Ganglionic level infarction (caudate, lentiform nuclei, internal capsule, insula, M1-M3 cortex): 7 - Supraganglionic infarction (M4-M6 cortex): 3 Total score (0-10 with 10 being normal): 10 IMPRESSION: 1. No acute intracranial abnormality. 2. Atrophy and extensive chronic ischemic change. 3. ASPECTS is 10 These results were called by telephone at the time of  interpretation on 06/03/2017 at 5:15 pm to Dr. Merrily Brittle , who verbally acknowledged these results. Electronically Signed   By: Marlan Palau M.D.  On: 06/03/2017 17:15      Assessment/Plan 1. Acute left hemispheric stroke associated with carotid stenosis: I have reviewed the duplex ultrasound of the carotids and there is left carotid plaque. Whether this rises to a degree that should be treated surgically or with intervention is indeterminate at this time. The natural course would be to obtain CT angiography however given her renal insufficiency this may not be feasible. Presently she is undergoing hydration overnight and her renal function will be reassessed tomorrow. If CT angiography is not possible then conventional angiography can be performed with minimal contrast exposure.  This was discussed with the patient. The risks and benefits were reviewed. We will wait to make a final decision until tomorrow when we see what her renal status is after hydration.  2. Bradycardia: Patient is currently undergoing evaluation for pacemaker placement.  3. Continue antihypertensive medications as already ordered, these medications have been reviewed and there are no changes at this time.  4.  CVA: Continue antiplatelet therapy as ordered Lipitor is also been ordered.    Levora Dredge, MD  06/05/2017 7:55 PM    This note was created with Dragon medical transcription system.  Any error is purely unintentional

## 2017-06-05 NOTE — Progress Notes (Signed)
Physical Therapy Treatment Patient Details Name: Joanna Stone MRN: [id number] DOB: 1941/02/26 Today's Date: 06/05/2017    History of Present Illness Pt is a 76 y.o.femalewith a history of asymptomatic bradycardia, CKD III-IV, CVA with left-sided weakness and dysarthria, HTN, tob abuse, and chronic bradycardia. Presented to ED on 9/23 for increased slurred speech. MRI indicates 7 mm acute infarct in the left insula and small area of subacute infarct in the left temporoparietal white matter.    PT Comments    Pt initially sleeping; awakens to voice. Pt participates in supine exercises with extensive verbal and tactile cueing for performance. Pt with difficulty focusing on task at hand and constantly wishes to look ahead to next instruction saying "now what" before completing tasks. Pt with guarded/stiff movement on Left lower extremity. Requires Mod A for bed mobility; can sit with SBA balance at edge of bed for a short while, but wishes return to bed. Continue PT to progress strength, endurance, and improved ease of mobility to improve function.    Follow Up Recommendations  Home health PT;Supervision/Assistance - 24 hour     Equipment Recommendations       Recommendations for Other Services       Precautions / Restrictions Precautions Precautions: Fall Restrictions Weight Bearing Restrictions: No    Mobility  Bed Mobility Overal bed mobility: Needs Assistance Bed Mobility: Supine to Sit;Sit to Supine     Supine to sit: Mod assist Sit to supine: Mod assist      Transfers                 General transfer comment: Not tested; wished back to bed  Ambulation/Gait             General Gait Details: "I can't walk"; non ambulator   Stairs            Wheelchair Mobility    Modified Rankin (Stroke Patients Only)       Balance Overall balance assessment: Needs assistance;History of Falls Sitting-balance support: Single extremity supported;Feet  supported Sitting balance-Leahy Scale: Fair                                      Cognition Arousal/Alertness: Awake/alert (Inititially sleeping; awoken throughout voice) Behavior During Therapy: Impulsive Overall Cognitive Status: No family/caregiver present to determine baseline cognitive functioning                                        Exercises General Exercises - Lower Extremity Ankle Circles/Pumps: PROM;Left;15 reps;Supine (AAROM R) Quad Sets: Strengthening;Both;15 reps;Supine Gluteal Sets: Strengthening;Both;15 reps;Supine Short Arc Quad: AROM;AAROM;Both;15 reps;Supine Heel Slides: AAROM;Both;15 reps;Supine Hip ABduction/ADduction: AROM;AAROM;Both;15 reps;Supine Straight Leg Raises: Strengthening;Both;15 reps;Supine    General Comments        Pertinent Vitals/Pain Pain Assessment: No/denies pain    Home Living                      Prior Function            PT Goals (current goals can now be found in the care plan section) Progress towards PT goals: Progressing toward goals    Frequency    7X/week      PT Plan Current plan remains appropriate    Co-evaluation  AM-PAC PT "6 Clicks" Daily Activity  Outcome Measure  Difficulty turning over in bed (including adjusting bedclothes, sheets and blankets)?: Unable (can only roll L) Difficulty moving from lying on back to sitting on the side of the bed? : Unable Difficulty sitting down on and standing up from a chair with arms (e.g., wheelchair, bedside commode, etc,.)?: Unable Help needed moving to and from a bed to chair (including a wheelchair)?: A Lot Help needed walking in hospital room?: Total Help needed climbing 3-5 steps with a railing? : Total 6 Click Score: 7    End of Session   Activity Tolerance: Other (comment) (cognition; requires extensive verbal/tactile cueing) Patient left: in bed;with call bell/phone within reach;with bed alarm  set   PT Visit Diagnosis: Other abnormalities of gait and mobility (R26.89);Muscle weakness (generalized) (M62.81);History of falling (Z91.81)     Time: 9528-4132 PT Time Calculation (min) (ACUTE ONLY): 31 min  Charges:  $Therapeutic Exercise: 23-37 mins                    G Codes:       Scot Dock, PTA 06/05/2017, 3:55 PM

## 2017-06-06 DIAGNOSIS — I6522 Occlusion and stenosis of left carotid artery: Secondary | ICD-10-CM

## 2017-06-06 DIAGNOSIS — G459 Transient cerebral ischemic attack, unspecified: Secondary | ICD-10-CM

## 2017-06-06 DIAGNOSIS — I471 Supraventricular tachycardia: Secondary | ICD-10-CM

## 2017-06-06 DIAGNOSIS — N189 Chronic kidney disease, unspecified: Secondary | ICD-10-CM

## 2017-06-06 LAB — CBC
HEMATOCRIT: 34.5 % — AB (ref 35.0–47.0)
HEMOGLOBIN: 11.4 g/dL — AB (ref 12.0–16.0)
MCH: 32 pg (ref 26.0–34.0)
MCHC: 33 g/dL (ref 32.0–36.0)
MCV: 96.8 fL (ref 80.0–100.0)
Platelets: 175 10*3/uL (ref 150–440)
RBC: 3.56 MIL/uL — ABNORMAL LOW (ref 3.80–5.20)
RDW: 13.4 % (ref 11.5–14.5)
WBC: 4.7 10*3/uL (ref 3.6–11.0)

## 2017-06-06 LAB — BASIC METABOLIC PANEL
ANION GAP: 5 (ref 5–15)
BUN: 29 mg/dL — ABNORMAL HIGH (ref 6–20)
CALCIUM: 8.4 mg/dL — AB (ref 8.9–10.3)
CO2: 23 mmol/L (ref 22–32)
Chloride: 113 mmol/L — ABNORMAL HIGH (ref 101–111)
Creatinine, Ser: 1.6 mg/dL — ABNORMAL HIGH (ref 0.44–1.00)
GFR, EST AFRICAN AMERICAN: 35 mL/min — AB (ref >60)
GFR, EST NON AFRICAN AMERICAN: 30 mL/min — AB (ref >60)
Glucose, Bld: 99 mg/dL (ref 65–99)
POTASSIUM: 4.5 mmol/L (ref 3.5–5.1)
SODIUM: 141 mmol/L (ref 135–145)

## 2017-06-06 MED ORDER — SODIUM CHLORIDE 0.9 % IV SOLN
INTRAVENOUS | Status: AC
  Administered 2017-06-06 – 2017-06-07 (×2): via INTRAVENOUS

## 2017-06-06 MED ORDER — GABAPENTIN 300 MG PO CAPS
600.0000 mg | ORAL_CAPSULE | Freq: Two times a day (BID) | ORAL | Status: DC
  Administered 2017-06-06 – 2017-06-08 (×4): 600 mg via ORAL
  Filled 2017-06-06 (×4): qty 2

## 2017-06-06 MED ORDER — HYDRALAZINE HCL 50 MG PO TABS
75.0000 mg | ORAL_TABLET | Freq: Three times a day (TID) | ORAL | Status: DC
  Administered 2017-06-06 – 2017-06-08 (×6): 75 mg via ORAL
  Filled 2017-06-06 (×6): qty 1

## 2017-06-06 NOTE — Progress Notes (Signed)
Progress Note  Patient Name: Joanna Stone Date of Encounter: 06/06/2017  Primary Cardiologist: Odessa Fleming, MD   Subjective   No chest pain or sob.  No recurrent stroke Ss.  Ongoing bradycardia. Brief run of atrial tach this AM while asleep.  Inpatient Medications    Scheduled Meds: . acetaminophen  1,000 mg Oral Q12H  . aspirin  81 mg Oral Daily  . atorvastatin  40 mg Oral q1800  . calcium-vitamin D  1 tablet Oral Daily  . citalopram  40 mg Oral Daily  . clopidogrel  75 mg Oral Daily  . enalapril  10 mg Oral Daily  . enoxaparin (LOVENOX) injection  30 mg Subcutaneous Q24H  . ferrous sulfate  325 mg Oral BID WC  . gabapentin  600 mg Oral TID  . hydrALAZINE  50 mg Oral TID  . loratadine  10 mg Oral Daily  . pantoprazole  40 mg Oral Daily  . polyethylene glycol  17 g Oral Daily   Continuous Infusions: . sodium chloride 75 mL/hr at 06/06/17 0630   PRN Meds: acetaminophen **OR** acetaminophen (TYLENOL) oral liquid 160 mg/5 mL **OR** acetaminophen, atropine, hydrALAZINE, oxyCODONE-acetaminophen, senna-docusate   Vital Signs    Vitals:   06/05/17 2119 06/06/17 0407 06/06/17 0933 06/06/17 1228  BP: (!) 156/101 (!) 146/66 (!) 177/84 (!) 174/97  Pulse: (!) 114 (!) 50 (!) 59 (!) 59  Resp: Temp: 97.8 F (36.6 C) 98.1 F (36.7 C) 98.2 F (36.8 C) 98.2 F (36.8 C)  TempSrc: Oral Oral Oral Oral  SpO2: 95% 98% 94% 93%  Weight:  135 lb (61.2 kg)    Height:        Intake/Output Summary (Last 24 hours) at 06/06/17 1236 Last data filed at 06/06/17 1024  Gross per 24 hour  Intake           1062.5 ml  Output                0 ml  Net           1062.5 ml   Filed Weights   06/03/17 1709 06/03/17 2013 06/06/17 0407  Weight: 128 lb (58.1 kg) 134 lb 1.6 oz (60.8 kg) 135 lb (61.2 kg)    Physical Exam   GEN: Well nourished, well developed, in no acute distress.  HEENT: Grossly normal.  Neck: Supple, no JVD, carotid bruits, or masses. Cardiac: Irreg, no  murmurs, rubs, or gallops. No clubbing, cyanosis, edema.  Radials/DP/PT 2+ and equal bilaterally.  Respiratory:  Respirations regular and unlabored, diminished breath sounds bilaterally. GI: Soft, nontender, nondistended, BS + x 4. MS: no deformity or atrophy. Skin: warm and dry, no rash. Neuro:  Strength and sensation are intact. Psych: AAOx3.  Normal affect.  Labs    Chemistry Recent Labs Lab 06/03/17 1706 06/06/17 0409  NA 139 141  K 4.8 4.5  CL 108 113*  CO2 23 23  GLUCOSE 104* 99  BUN 37* 29*  CREATININE 1.65* 1.60*  CALCIUM 9.0 8.4*  PROT 5.9*  --   ALBUMIN 3.4*  --   AST 23  --   ALT 16  --   ALKPHOS 53  --   BILITOT 0.4  --   GFRNONAA 29* 30*  GFRAA 34* 35*  ANIONGAP 8 5     Hematology Recent Labs Lab 06/03/17 1706 06/06/17 0409  WBC 7.2 4.7  RBC 3.79* 3.56*  HGB 12.5 11.4*  HCT 36.6 34.5*  MCV  96.5 96.8  MCH 33.0 32.0  MCHC 34.2 33.0  RDW 13.5 13.4  PLT 190 175    Cardiac Enzymes Recent Labs Lab 06/03/17 1706  TROPONINI <0.03      Radiology    No results found.  Telemetry    Sinus with freq pauses and jxnl escape - Personally Reviewed  Cardiac Studies   Carotid U/S 06/04/2017  IMPRESSION: Right:  Color duplex indicates minimal heterogeneous and calcified plaque, with no hemodynamically significant stenosis by duplex criteria in the right extracranial cerebrovascular circulation.  Left:  Heterogeneous plaque at the carotid bifurcation, with discordant results regarding degree of stenosis by established duplex criteria. Peak velocity suggests 50% -69% stenosis, with the ICA/ CCA ratio suggesting a lesser degree of stenosis. If establishing a more accurate degree of stenosis is required, cerebral angiogram should be considered, or as a second best test, CTA.  Note that there is irregular heart rate on the duplex exam, potentially atrial fibrillation. _____________   2D Echocardiogram 9.24.2018  Study Conclusions  -  Left ventricle: The cavity size was normal. There was mild   concentric hypertrophy. Systolic function was normal. The   estimated ejection fraction was in the range of 55% to 60%. Wall   motion was normal; there were no regional wall motion   abnormalities. The study is not technically sufficient to allow   evaluation of LV diastolic function. - Aortic valve: There was trivial regurgitation. - Mitral valve: There was mild regurgitation. - Left atrium: The atrium was moderately dilated. - Pulmonary arteries: Systolic pressure was mildly increased. PA   peak pressure: 40 mm Hg (S).  Patient Profile     76 y.o. female with a history of asymptomatic bradycardia, CKD III-IV, CVA, HTN, and tob abuse admitted with CVA and found to have   Assessment & Plan    1. Sinus node dysfxn w/ jxnl bradycardia:  Asymptomatic.  Rates 30's to 50's.  Pauses > 4 seconds on occasion.  Seen by Dr. Graciela Husbands yesterday with recommendation for continued conservative rx given asymptomatic, comorbidities, disability, and hemodynamic stability.  Avoid AVN blocking agents.  2.  Atrial tachycardia:  Brief run this AM while sleeping.  Asymp.   3.  CVA/Carotid dzs:  MRI this admission showed 7mm acute left insula infarct with subacue infarct in L temporoparietal white matter.  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  Seen by vasc surgery.  Possible CTA vs peripheral angio.  Creat stable but still elevated @ 1.6.  If she were to require carotid intervention, we will likely need to rethink our approach re: pacing.  4.  HTN:  Hold acei with probable need for contrast.  Titrate hydral.  5.  Tob Abuse:  Cessation advised.  6.  CKD III-IV:  Holding acei as above.  Stable.  Signed, Nicolasa Ducking, NP  06/06/2017, 12:36 PM    For questions or updates, please contact   Please consult [email or web address] for contact info under Cardiology/STEMI.   Attending Note Patient seen and examined, agree with detailed note above,  Patient  presentation and plan discussed on rounds.   On exam today she is comfortable, no complaints Telemetry reviewed showing bradycardia, APCs, junctional escape beats, rare short episode of atrial tachycardia. Case has been discussed with Dr. Graciela Husbands, no plan for pacemaker at this time She has long-standing history of asymptomatic bradycardia  On physical exam no JVD, heart sounds regular with ectopy, bradycardic, lungs clear to auscultation bilaterally, abdomen soft nontender no significant lower extremity edema  Lab work reviewed showing creatinine 1.6, BUN 29, potassium 4.5  --Bradycardia Long-standing history, asymptomatic Has been evaluated by EP on numerous occasions with plan for medical management No plan for pacemaker at this time  ---Narrow complex tachycardia Short run of what appeared to be atrial tachycardia rate 120s, asymptomatic Candidate for rate or rhythm controlling agents We'll monitor for now  --- Stroke Evaluated by neurology Followed by vascular Given elevated creatinine may need angio to evaluate carotid stenosis   Greater than 50% was spent in counseling and coordination of care with patient Total encounter time 25 minutes or more   Signed: Dossie Arbour  M.D., Ph.D. [facility]

## 2017-06-06 NOTE — Progress Notes (Signed)
OT Cancellation Note  Patient Details Name: AZARIE CORIZ MRN: [id number] DOB: 1941/03/16   Cancelled Treatment:    Reason Eval/Treat Not Completed: Patient declined, no reason specified. Upon attempt, pt asleep, easily wakes to OT's voice. Pt refusing OT treatment this morning despite encouragement. Will re-attempt at later date/time as schedule allows.  Richrd Prime, MPH, MS, OTR/L ascom [phone] 06/06/17, 11:42 AM

## 2017-06-06 NOTE — Progress Notes (Signed)
Patient HR went up to 136, CHRIS PA was made aware , will continue to monitor

## 2017-06-06 NOTE — Clinical Social Work Note (Signed)
Clinical Social Work Assessment  Patient Details  Name: Joanna Stone MRN: [id number] Date of BLOREL [id number]  Date of referral:  06/06/17               Reason for consult:  Facility Placement                Permission sought to share information with:  Facility Medical sales representative, Family Supports Permission granted to share information::  Yes, Verbal Permission Granted  Name::     [facility] Legal Guardian  [phone] or Drue Second  [phone] or Rockney Ghee   [phone] or Love,Evelyn Relative [phone]   Agency::  ALF admissions  Relationship::     Contact Information:     Housing/Transportation Living arrangements for the past 2 months:  Assisted Living Facility ([facility]) Source of Information:  Patient, Facility Patient Interpreter Needed:  None Criminal Activity/Legal Involvement Pertinent to Current Situation/Hospitalization:  No - Comment as needed Significant Relationships:  Merchandiser, retail Lives with:  Facility Resident Do you feel safe going back to the place where you live?  Yes Need for family participation in patient care:  Yes (Comment) (Patients has a legal guardian through DSS.)  Care giving concerns:  Patient does not express any concerns about returning back to ALF.   Social Worker assessment / plan:  Patient is a 76 year old female who is alert and oriented x4.  Patient has a legal guardian through DSS [name] [phone], Patient states she has been at [facility] for about 5 years, she says she has been pleased with the care.  Patient reported that the staff takes good care of her, and she would like to return as soon as she can.  Patient was explained role of CSW and process for coordinating with ALF to have her return.  CSW was informed by ALF, they can accept patient back with home health once she is medically ready for discharge.  Patient did not express any other questions or concerns.     Employment  status:  Retired Health and safety inspector:  Medicare PT Recommendations:  Home with Home Health Information / Referral to community resources:     Patient/Family's Response to care:  Patient is in agreement to returning back to [facility]. Patient/Family's Understanding of and Emotional Response to Diagnosis, Current Treatment, and Prognosis:  Patient expressed that she is hopeful that she will be able to return back to ALF as soon as she can.  Emotional Assessment Appearance:  Appears stated age Attitude/Demeanor/Rapport:    Affect (typically observed):  Appropriate, Calm Orientation:  Oriented to Self, Oriented to Place, Oriented to  Time, Oriented to Situation Alcohol / Substance use:  Not Applicable Psych involvement (Current and /or in the community):  No (Comment)  Discharge Needs  Concerns to be addressed:  Care Coordination Readmission within the last 30 days:  No Current discharge risk:  None Barriers to Discharge:  Continued Medical Work up   Joanna Stone, LCSWA 06/06/2017, 5:03 PM

## 2017-06-06 NOTE — Progress Notes (Signed)
PT Cancellation Note  Patient Details Name: Joanna Stone MRN: [id number] DOB: Feb 18, 1941   Cancelled Treatment:    Reason Eval/Treat Not Completed: Other (comment).  Pt busy with nursing this morning when PT initially attempting to see pt.  This therapist unable to return to retry therapy session.  Will re-attempt PT session at a later date/time.  Hendricks Limes, PT 06/06/17, 11:33 AM [phone]

## 2017-06-06 NOTE — Progress Notes (Signed)
[facility]  Daily Progress Note   Subjective  -   No further neurologic events. Patient remains fluent in her speech  Creatinine clearance remains 30 after 36 hours of hydration  Objective Vitals:   06/05/17 2119 06/06/17 0407 06/06/17 0933 06/06/17 1228  BP: (!) 156/101 (!) 146/66 (!) 177/84 (!) 174/97  Pulse: (!) 114 (!) 50 (!) 59 (!) 59  Resp: Temp: 97.8 F (36.6 C) 98.1 F (36.7 C) 98.2 F (36.8 C) 98.2 F (36.8 C)  TempSrc: Oral Oral Oral Oral  SpO2: 95% 98% 94% 93%  Weight:  61.2 kg (135 lb)    Height:        Intake/Output Summary (Last 24 hours) at 06/06/17 1820 Last data filed at 06/06/17 1800  Gross per 24 hour  Intake           766.25 ml  Output                0 ml  Net           766.25 ml    PULM  Normal effort , no use of accessory muscles CV  No JVD, RRR Abd      No distended, nontender VASC  2+ carotid pulses bilaterally.No carotid bruit noted on exam Laboratory CBC    Component Value Date/Time   WBC 4.7 06/06/2017 0409   HGB 11.4 (L) 06/06/2017 0409   HGB 13.5 08/01/2012 0358   HCT 34.5 (L) 06/06/2017 0409   HCT 39.3 08/01/2012 0358   PLT 175 06/06/2017 0409   PLT 160 08/01/2012 0358    BMET    Component Value Date/Time   NA 141 06/06/2017 0409   NA 140 08/01/2012 0358   K 4.5 06/06/2017 0409   K 4.6 08/01/2012 0358   CL 113 (H) 06/06/2017 0409   CL 111 (H) 08/01/2012 0358   CO2 23 06/06/2017 0409   CO2 21 08/01/2012 0358   GLUCOSE 99 06/06/2017 0409   GLUCOSE 94 08/01/2012 0358   BUN 29 (H) 06/06/2017 0409   BUN 29 (H) 08/01/2012 0358   CREATININE 1.60 (H) 06/06/2017 0409   CREATININE 1.50 (H) 08/01/2012 0358   CALCIUM 8.4 (L) 06/06/2017 0409   CALCIUM 8.8 08/01/2012 0358   GFRNONAA 30 (L) 06/06/2017 0409   GFRNONAA 35 (L) 08/01/2012 0358   GFRAA 35 (L) 06/06/2017 0409   GFRAA 40 (L) 08/01/2012 0358    Assessment/Planning: 1. 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 Given her creatinine clearance has not improved significantly after 36 hours of hydration I believe it is unlikely we will be able to obtain a CT angiogram. I will plan for conventional angiography this Friday if the patient remains in the hospital.I will discuss this with Dr. Imogene Burn  This was Reviewed with the patient. The risks and benefits were reviewed as well. We will wait to make a final decision until tomorrow when we see what her renal status is after hydration.  2. Bradycardia: cardiology wishes to continue with medical therapy at this time and we'll hold off on pacemaker placement  3. Continue antihypertensive medications as already ordered, these medications have been reviewed and there are no changes at this time.  4.  CVA: Continue antiplatelet therapy as ordered Lipitor is also been ordered.    Levora Dredge  06/06/2017, 6:20 PM

## 2017-06-06 NOTE — Progress Notes (Addendum)
[facility]   PATIENT NAME: Joanna Stone    MR#:  [id number]  DATE OF BIRTH:  09-Nov-1940  SUBJECTIVE:  CHIEF COMPLAINT:   Chief Complaint  Patient presents with  . Code Stroke   No complaint. REVIEW OF SYSTEMS:  Review of Systems  Constitutional: Positive for malaise/fatigue. Negative for chills and fever.  HENT: Negative for sore throat.   Eyes: Negative for blurred vision and double vision.  Respiratory: Negative for cough, hemoptysis, shortness of breath, wheezing and stridor.   Cardiovascular: Negative for chest pain, palpitations, orthopnea and leg swelling.  Gastrointestinal: Negative for abdominal pain, blood in stool, diarrhea, melena, nausea and vomiting.  Genitourinary: Negative for dysuria, flank pain and hematuria.  Musculoskeletal: Negative for back pain and joint pain.  Neurological: Positive for speech change and weakness. Negative for dizziness, sensory change, focal weakness, seizures, loss of consciousness and headaches.  Endo/Heme/Allergies: Negative for polydipsia.  Psychiatric/Behavioral: Negative for depression. The patient is not nervous/anxious.     DRUG ALLERGIES:   Allergies  Allergen Reactions  . Ultram [Tramadol]     vomiting   VITALS:  Blood pressure (!) 174/97, pulse (!) 59, temperature 98.2 F (36.8 C), temperature source Oral, resp. rate 18, height 5' 0.1" (1.527 m), weight 135 lb (61.2 kg), SpO2 93 %. PHYSICAL EXAMINATION:  Physical Exam  Constitutional: She is oriented to person, place, and time and well-developed, well-nourished, and in no distress.  HENT:  Head: Normocephalic.  Mouth/Throat: Oropharynx is clear and moist.  Eyes: Pupils are equal, round, and reactive to light. Conjunctivae and EOM are normal. No scleral icterus.  Neck: Normal range of motion. Neck supple. No JVD present. No tracheal deviation present.  Cardiovascular: Normal rate, regular rhythm and normal heart sounds.   Exam reveals no gallop.   No murmur heard. Pulmonary/Chest: Effort normal and breath sounds normal. No respiratory distress. She has no wheezes. She has no rales.  Abdominal: Soft. Bowel sounds are normal. She exhibits no distension. There is no tenderness. There is no rebound.  Musculoskeletal: Normal range of motion. She exhibits no edema or tenderness.  Neurological: She is alert and oriented to person, place, and time.  Dysarthria, mild left facial droop Power 0/5 in left arm, 4/5 in right arm, 3/5 in both legs.  Skin: No rash noted. No erythema.  Psychiatric: Affect normal.   LABORATORY PANEL:  Female CBC  Recent Labs Lab 06/06/17 0409  WBC 4.7  HGB 11.4*  HCT 34.5*  PLT 175   ------------------------------------------------------------------------------------------------------------------ Chemistries   Recent Labs Lab 06/03/17 1706 06/06/17 0409  NA 139 141  K 4.8 4.5  CL 108 113*  CO2 23 23  GLUCOSE 104* 99  BUN 37* 29*  CREATININE 1.65* 1.60*  CALCIUM 9.0 8.4*  AST 23  --   ALT 16  --   ALKPHOS 53  --   BILITOT 0.4  --    RADIOLOGY:  No results found. ASSESSMENT AND PLAN:   Acute CVA with worsening of dysarthria, history of CVA. Per MRI brain, 7 mm acute infarct in the left insula. Small area of subacute infarct in the left temporoparietal white matter. Continue ASA, added plavix and lipitor, hold Pravachol.  Per Dr. Thad Ranger, neurology consult, CTA of the head and neck. Since patient's GFR is less than 30, cannot get CTA. Continue IV fluid and follow-up renal function per Dr. Thad Ranger.  PT and OT evaluation: need HHPT.  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  Per  Vascular surgery consult Dr. Gilda Crease, If CT angiography is not possible then conventional angiography can be performed with minimal contrast exposure..  Sinus node dysfxn w/ jxnl bradycardia:  Asymptomatic.  Per Dr. Mariah Milling, Dr. Graciela Husbands recommends continue conservative rx given asymptomatic,  comorbidities, disability, and hemodynamic stability.  Avoid AVN blocking agents.  Essential hypertension blood pressure is elevated Continue her home medications hydralazine and hold enalapril.  CKD stage 3, stable. on NS iv, follow-up BMP. Tobacco abuse. Smoking cessation was counseled 4 minutes.  I discussed with cardiology NP, Mr. Brion Aliment. All the records are reviewed and case discussed with Care Management/Social Worker. Management plans discussed with the patient, family and they are in agreement.  CODE STATUS: Full Code  TOTAL TIME TAKING CARE OF THIS PATIENT: 33 minutes.   More than 50% of the time was spent in counseling/coordination of care: YES  POSSIBLE D/C IN 2 DAYS, DEPENDING ON CLINICAL CONDITION.   Shaune Pollack M.D on 06/06/2017 at 3:13 PM  Between 7am to 6pm - Pager - [phone]  After 6pm go to [email or web address] - Therapist, nutritional Hospitalists

## 2017-06-07 ENCOUNTER — Inpatient Hospital Stay: Payer: Medicare Other

## 2017-06-07 DIAGNOSIS — I63132 Cerebral infarction due to embolism of left carotid artery: Secondary | ICD-10-CM

## 2017-06-07 DIAGNOSIS — Z7189 Other specified counseling: Secondary | ICD-10-CM

## 2017-06-07 DIAGNOSIS — K920 Hematemesis: Secondary | ICD-10-CM

## 2017-06-07 DIAGNOSIS — Z515 Encounter for palliative care: Secondary | ICD-10-CM

## 2017-06-07 LAB — BASIC METABOLIC PANEL
Anion gap: 6 (ref 5–15)
BUN: 23 mg/dL — AB (ref 6–20)
CHLORIDE: 111 mmol/L (ref 101–111)
CO2: 22 mmol/L (ref 22–32)
CREATININE: 1.29 mg/dL — AB (ref 0.44–1.00)
Calcium: 8.5 mg/dL — ABNORMAL LOW (ref 8.9–10.3)
GFR calc Af Amer: 45 mL/min — ABNORMAL LOW (ref >60)
GFR calc non Af Amer: 39 mL/min — ABNORMAL LOW (ref >60)
GLUCOSE: 93 mg/dL (ref 65–99)
POTASSIUM: 4.2 mmol/L (ref 3.5–5.1)
SODIUM: 139 mmol/L (ref 135–145)

## 2017-06-07 LAB — HEMOGLOBIN
HEMOGLOBIN: 14.1 g/dL (ref 12.0–16.0)
Hemoglobin: 14.6 g/dL (ref 12.0–16.0)

## 2017-06-07 MED ORDER — MUPIROCIN 2 % EX OINT
1.0000 "application " | TOPICAL_OINTMENT | Freq: Two times a day (BID) | CUTANEOUS | Status: DC
  Administered 2017-06-07: 1 via NASAL
  Filled 2017-06-07: qty 22

## 2017-06-07 MED ORDER — ISOSORBIDE MONONITRATE ER 30 MG PO TB24
30.0000 mg | ORAL_TABLET | Freq: Every day | ORAL | Status: DC
  Administered 2017-06-07 – 2017-06-08 (×2): 30 mg via ORAL
  Filled 2017-06-07 (×2): qty 1

## 2017-06-07 MED ORDER — IOPAMIDOL (ISOVUE-370) INJECTION 76%
60.0000 mL | Freq: Once | INTRAVENOUS | Status: AC | PRN
  Administered 2017-06-07: 60 mL via INTRAVENOUS

## 2017-06-07 MED ORDER — PANTOPRAZOLE SODIUM 40 MG IV SOLR
40.0000 mg | Freq: Two times a day (BID) | INTRAVENOUS | Status: DC
  Administered 2017-06-07 (×2): 40 mg via INTRAVENOUS
  Filled 2017-06-07 (×3): qty 40

## 2017-06-07 MED ORDER — CHLORHEXIDINE GLUCONATE CLOTH 2 % EX PADS
6.0000 | MEDICATED_PAD | Freq: Every day | CUTANEOUS | Status: DC
  Administered 2017-06-07: 6 via TOPICAL

## 2017-06-07 MED ORDER — ONDANSETRON HCL 4 MG/2ML IJ SOLN: 4.0000 mg | Freq: Four times a day (QID) | INTRAMUSCULAR | Status: DC | PRN

## 2017-06-07 NOTE — Care Management Note (Signed)
Case Management Note  Patient Details  Name: Joanna Stone MRN: [id number] Date of Birth: [birth year]  Subjective/Objective:                 Bardycardia, new cva,  and carotid stenosis.  Vascular and cardiology consulting. New gi consult for possible hematemesis.  Patient is full code.  has legal guardian       Action/Plan:  Requested palliative consult  Expected Discharge Date:  06/05/17               Expected Discharge Plan:     In-House Referral:     Discharge planning Services     Post Acute Care Choice:    Choice offered to:     DME Arranged:    DME Agency:     HH Arranged:    HH Agency:     Status of Service:     If discussed at [employer], dates discussed:    Additional Comments:  Eber Hong, RN 06/07/2017, 11:46 AM

## 2017-06-07 NOTE — Progress Notes (Signed)
Progress Note  Patient Name: Joanna Stone Date of Encounter: 06/07/2017  Primary Cardiologist: Odessa Fleming, MD   Subjective   He had CT scan done today for the carotid arteries. She vomited during the test and her heart rate increased to about 100. On telemetry it appears to be sinus rhythm with PACs.  Inpatient Medications    Scheduled Meds: . acetaminophen  1,000 mg Oral Q12H  . atorvastatin  40 mg Oral q1800  . calcium-vitamin D  1 tablet Oral Daily  . Chlorhexidine Gluconate Cloth  6 each Topical Q0600  . citalopram  40 mg Oral Daily  . enoxaparin (LOVENOX) injection  30 mg Subcutaneous Q24H  . ferrous sulfate  325 mg Oral BID WC  . gabapentin  600 mg Oral BID  . hydrALAZINE  75 mg Oral TID  . isosorbide mononitrate  30 mg Oral Daily  . loratadine  10 mg Oral Daily  . mupirocin ointment  1 application Nasal BID  . pantoprazole (PROTONIX) IV  40 mg Intravenous Q12H  . polyethylene glycol  17 g Oral Daily   Continuous Infusions: . sodium chloride 75 mL/hr at 06/07/17 0922   PRN Meds: acetaminophen **OR** acetaminophen (TYLENOL) oral liquid 160 mg/5 mL **OR** acetaminophen, atropine, hydrALAZINE, ondansetron (ZOFRAN) IV, oxyCODONE-acetaminophen, senna-docusate   Vital Signs    Vitals:   06/07/17 1207 06/07/17 1208 06/07/17 1215 06/07/17 1230  BP: (!) 154/102 (!) 155/101 (!) 176/90 (!) 172/86  Pulse: (!) 122 70 69 72  Resp:      Temp:      TempSrc:      SpO2:      Weight:      Height:        Intake/Output Summary (Last 24 hours) at 06/07/17 1519 Last data filed at 06/07/17 0700  Gross per 24 hour  Intake          1621.25 ml  Output                0 ml  Net          1621.25 ml   Filed Weights   06/03/17 1709 06/03/17 2013 06/06/17 0407  Weight: 128 lb (58.1 kg) 134 lb 1.6 oz (60.8 kg) 135 lb (61.2 kg)    Physical Exam   GEN: Well nourished, well developed, in no acute distress.  HEENT: Grossly normal.  Neck: Supple, no JVD, carotid bruits, or  masses. Cardiac: Irreg and tachycardic, no murmurs, rubs, or gallops. No clubbing, cyanosis, edema.  Radials/DP/PT 2+ and equal bilaterally.  Respiratory:  Respirations regular and unlabored, diminished breath sounds bilaterally. GI: Soft, nontender, nondistended, BS + x 4. MS: no deformity or atrophy. Skin: warm and dry, no rash. Neuro:  Strength and sensation are intact. Psych: AAOx3.  Normal affect.  Labs    Chemistry  Recent Labs Lab 06/03/17 1706 06/06/17 0409 06/07/17 0539  NA 139 141 139  K 4.8 4.5 4.2  CL 108 113* 111  CO2 GLUCOSE 104* 99 93  BUN 37* 29* 23*  CREATININE 1.65* 1.60* 1.29*  CALCIUM 9.0 8.4* 8.5*  PROT 5.9*  --   --   ALBUMIN 3.4*  --   --   AST 23  --   --   ALT 16  --   --   ALKPHOS 53  --   --   BILITOT 0.4  --   --   GFRNONAA 29* 30* 39*  GFRAA 34* 35* 45*  ANIONGAP Hematology  Recent Labs Lab 06/03/17 1706 06/06/17 0409 06/07/17 1117  WBC 7.2 4.7  --   RBC 3.79* 3.56*  --   HGB 12.5 11.4* 14.1  HCT 36.6 34.5*  --   MCV 96.5 96.8  --   MCH 33.0 32.0  --   MCHC 34.2 33.0  --   RDW 13.5 13.4  --   PLT 190 175  --     Cardiac Enzymes  Recent Labs Lab 06/03/17 1706  TROPONINI <0.03      Radiology    Ct Angio Head W Or Wo Contrast  Result Date: 06/07/2017 CLINICAL DATA:  Stroke EXAM: CT ANGIOGRAPHY HEAD AND NECK TECHNIQUE: Multidetector CT imaging of the head and neck was performed using the standard protocol during bolus administration of intravenous contrast. Multiplanar CT image reconstructions and MIPs were obtained to evaluate the vascular anatomy. Carotid stenosis measurements (when applicable) are obtained utilizing NASCET criteria, using the distal internal carotid diameter as the denominator. CONTRAST:  60 mL Isovue MRI 370 IV COMPARISON:  MRI and MRA head 06/04/2017.  CT head 06/03/2017 FINDINGS: CT HEAD FINDINGS Brain: Moderate atrophy. Chronic right MCA infarct with volume loss right insula and  external capsule and deep white matter. Compensatory enlargement right lateral ventricle. Chronic ischemia in the white matter bilaterally. Small acute infarct in the left insular lobe best seen on recent MRI. Negative for hemorrhage or mass. Vascular: Negative for hyperdense vessel Skull: Left pterional craniotomy.  No acute skull lesion Sinuses: Negative Orbits: Negative Review of the MIP images confirms the above findings CTA NECK FINDINGS Aortic arch: Mild atherosclerotic disease in the aortic arch without aneurysm or dissection. Proximal great vessels widely patent. Three vessel aortic arch. Right carotid system: Right common carotid artery widely patent. Atherosclerotic plaque in the carotid bulb with 25% diameter stenosis of the internal carotid artery lumen. Moderate stenosis origin of the right external carotid artery. Left carotid system: Left common carotid artery widely patent. Calcific atherosclerotic plaque in the left carotid bulb without significant stenosis. External carotid artery widely patent Vertebral arteries: Mild stenosis at the origin of the right vertebral artery. Right vertebral artery is patent to the basilar without significant stenosis. Left vertebral artery patent to the basilar without significant stenosis. Skeleton: Moderate cervical spine degenerative change. No acute skeletal abnormality. Other neck: Negative Upper chest: Lung apices clear Review of the MIP images confirms the above findings CTA HEAD FINDINGS Anterior circulation: Right cavernous carotid is diffusely diseased with calcification and mild stenosis. Diffuse atherosclerotic disease and mild narrowing throughout the right M1 segment. Apparent chronic occlusion of the superior division of the right middle cerebral artery unchanged from the prior study Left cavernous carotid artery diffusely calcified with mild stenosis. 1.5 mm infundibulum of the posterior communicating artery origin corresponds to the MRA finding. Left  M1 segment patent. Left middle cerebral artery branches patent without significant stenosis. Both anterior cerebral arteries are patent. Posterior circulation: Both vertebral arteries patent to the basilar. PICA patent bilaterally. Mild atherosclerotic disease in the basilar without stenosis. Superior cerebellar artery is patent. Atherosclerotic disease and mild stenosis in the P1 segment bilaterally. Venous sinuses: Patent Anatomic variants: None Delayed phase: Normal enhancement following contrast administration. Review of the MIP images confirms the above findings IMPRESSION: Atrophy and extensive chronic ischemic changes. Chronic right MCA infarct. No acute infarct on CT. Small acute infarct left insula identified on recent MRI 25% diameter stenosis right internal carotid artery. Atherosclerotic disease  throughout the cavernous carotid with mild stenosis No significant left internal carotid artery stenosis in the neck. Atherosclerotic disease and mild stenosis left cavernous carotid Right M1 segment diffusely diseased with apparent occlusion of the superior division of the right MCA likely chronic related to chronic right MCA infarct. Mild stenosis P1 segment bilaterally. Atherosclerotic disease basilar without significant stenosis. Mild stenosis origin right vertebral artery. Left vertebral artery widely patent. Probable 1.5 mm infundibulum right posterior communicating artery. Electronically Signed   By: Marlan Palau M.D.   On: 06/07/2017 13:09   Ct Angio Neck W Or Wo Contrast  Result Date: 06/07/2017 CLINICAL DATA:  Stroke EXAM: CT ANGIOGRAPHY HEAD AND NECK TECHNIQUE: Multidetector CT imaging of the head and neck was performed using the standard protocol during bolus administration of intravenous contrast. Multiplanar CT image reconstructions and MIPs were obtained to evaluate the vascular anatomy. Carotid stenosis measurements (when applicable) are obtained utilizing NASCET criteria, using the distal  internal carotid diameter as the denominator. CONTRAST:  60 mL Isovue MRI 370 IV COMPARISON:  MRI and MRA head 06/04/2017.  CT head 06/03/2017 FINDINGS: CT HEAD FINDINGS Brain: Moderate atrophy. Chronic right MCA infarct with volume loss right insula and external capsule and deep white matter. Compensatory enlargement right lateral ventricle. Chronic ischemia in the white matter bilaterally. Small acute infarct in the left insular lobe best seen on recent MRI. Negative for hemorrhage or mass. Vascular: Negative for hyperdense vessel Skull: Left pterional craniotomy.  No acute skull lesion Sinuses: Negative Orbits: Negative Review of the MIP images confirms the above findings CTA NECK FINDINGS Aortic arch: Mild atherosclerotic disease in the aortic arch without aneurysm or dissection. Proximal great vessels widely patent. Three vessel aortic arch. Right carotid system: Right common carotid artery widely patent. Atherosclerotic plaque in the carotid bulb with 25% diameter stenosis of the internal carotid artery lumen. Moderate stenosis origin of the right external carotid artery. Left carotid system: Left common carotid artery widely patent. Calcific atherosclerotic plaque in the left carotid bulb without significant stenosis. External carotid artery widely patent Vertebral arteries: Mild stenosis at the origin of the right vertebral artery. Right vertebral artery is patent to the basilar without significant stenosis. Left vertebral artery patent to the basilar without significant stenosis. Skeleton: Moderate cervical spine degenerative change. No acute skeletal abnormality. Other neck: Negative Upper chest: Lung apices clear Review of the MIP images confirms the above findings CTA HEAD FINDINGS Anterior circulation: Right cavernous carotid is diffusely diseased with calcification and mild stenosis. Diffuse atherosclerotic disease and mild narrowing throughout the right M1 segment. Apparent chronic occlusion of the  superior division of the right middle cerebral artery unchanged from the prior study Left cavernous carotid artery diffusely calcified with mild stenosis. 1.5 mm infundibulum of the posterior communicating artery origin corresponds to the MRA finding. Left M1 segment patent. Left middle cerebral artery branches patent without significant stenosis. Both anterior cerebral arteries are patent. Posterior circulation: Both vertebral arteries patent to the basilar. PICA patent bilaterally. Mild atherosclerotic disease in the basilar without stenosis. Superior cerebellar artery is patent. Atherosclerotic disease and mild stenosis in the P1 segment bilaterally. Venous sinuses: Patent Anatomic variants: None Delayed phase: Normal enhancement following contrast administration. Review of the MIP images confirms the above findings IMPRESSION: Atrophy and extensive chronic ischemic changes. Chronic right MCA infarct. No acute infarct on CT. Small acute infarct left insula identified on recent MRI 25% diameter stenosis right internal carotid artery. Atherosclerotic disease throughout the cavernous carotid with mild stenosis No  significant left internal carotid artery stenosis in the neck. Atherosclerotic disease and mild stenosis left cavernous carotid Right M1 segment diffusely diseased with apparent occlusion of the superior division of the right MCA likely chronic related to chronic right MCA infarct. Mild stenosis P1 segment bilaterally. Atherosclerotic disease basilar without significant stenosis. Mild stenosis origin right vertebral artery. Left vertebral artery widely patent. Probable 1.5 mm infundibulum right posterior communicating artery. Electronically Signed   By: Marlan Palau M.D.   On: 06/07/2017 13:09    Telemetry    Sinus tachycardia with PAC. - Personally Reviewed  Cardiac Studies   Carotid U/S 06/04/2017  IMPRESSION: Right:  Color duplex indicates minimal heterogeneous and calcified plaque, with  no hemodynamically significant stenosis by duplex criteria in the right extracranial cerebrovascular circulation.  Left:  Heterogeneous plaque at the carotid bifurcation, with discordant results regarding degree of stenosis by established duplex criteria. Peak velocity suggests 50% -69% stenosis, with the ICA/ CCA ratio suggesting a lesser degree of stenosis. If establishing a more accurate degree of stenosis is required, cerebral angiogram should be considered, or as a second best test, CTA.  Note that there is irregular heart rate on the duplex exam, potentially atrial fibrillation. _____________   2D Echocardiogram 9.24.2018  Study Conclusions  - Left ventricle: The cavity size was normal. There was mild   concentric hypertrophy. Systolic function was normal. The   estimated ejection fraction was in the range of 55% to 60%. Wall   motion was normal; there were no regional wall motion   abnormalities. The study is not technically sufficient to allow   evaluation of LV diastolic function. - Aortic valve: There was trivial regurgitation. - Mitral valve: There was mild regurgitation. - Left atrium: The atrium was moderately dilated. - Pulmonary arteries: Systolic pressure was mildly increased. PA   peak pressure: 40 mm Hg (S).  Patient Profile     76 y.o. female with a history of asymptomatic bradycardia, CKD III-IV, CVA, HTN, and tob abuse admitted with CVA and found to have   Assessment & Plan    1. Sinus node dysfxn w/ jxnl bradycardia:  Asymptomatic.   Interestingly, heart rate increased today for above 100 after she vomited. On telemetry, it appears to be sinus tachycardia with PACs. Given the patient's multiple comorbidities and lack of symptoms related to bradycardia, we do not recommend pacemaker placement.  2.  Atrial tachycardia:  Intermittent episodes.  Asymp. Do not start treatment for this given underlying bradycardia.  3.  CVA/Carotid dzs:  MRI this  admission showed 7mm acute left insula infarct with subacue infarct in L temporoparietal white matter.  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   CTA did not show significant carotid stenosis.  We will sign off. Please call with questions.  Signed, Lorine Bears, MD

## 2017-06-07 NOTE — Progress Notes (Signed)
Returned from CT. CT staff report she had 2 emesis while in CT.  She has no prior complaints of nausea.  CT staff report that the emesis was dark red in color and smelled like blood.  Dr. Imogene Burn notified. GI consult ordered.

## 2017-06-07 NOTE — Clinical Social Work Note (Signed)
CSW received phone call back from patient's legal guardian Joanna Stone [phone] or [phone]. Legal guardian would like patient to return back to [facility] once she is medically ready for discharge and orders have been received.  CSW to continue to follow patient's progress throughout discharge planning.  Ervin Knack. Ayo Smoak, MSW, Theresia Majors [phone]  06/07/2017 6:42 PM

## 2017-06-07 NOTE — Progress Notes (Signed)
PT Cancellation Note  Patient Details Name: Joanna Stone MRN: [id number] DOB: March 12, 1941   Cancelled Treatment:    Reason Eval/Treat Not Completed: Other (comment). Per chart "CT staff report she had 2 emesis while in CT.  She has no prior complaints of nausea.  CT staff report that the emesis was dark red in color and smelled like blood." Discussed with nursing to hold PT until later time.   Sarina Ser, SPT 06/07/2017, 11:35 AM

## 2017-06-07 NOTE — Consult Note (Signed)
Consultation Note Date: 06/07/2017   Patient Name: Joanna Stone  DOB: [birth year]  MRN: [id number]  Age / Sex: 76 y.o., female  PCP: Patient, No Pcp Per Referring Physician: Demetrios Loll, MD  Reason for Consultation: Establishing goals of care  HPI/Patient Profile: 76 y.o. female  with past medical history of CVA with left-sided weakness and dysarthria, CKD, HTN, chronic asymptomatic bradycardia, vascular dementia, brain surgery 2001 admitted on 06/03/2017 with worsening dysarthria and MRI shows left insular and left white matter temporoparietal acute infarcts.   Clinical Assessment and Goals of Care: I met today with [name]. Ms. Mccomb has a history of dementia but she seems to have a fairly good memory and able to remember somewhat her medical situation with stroke and more diagnostic testing planned. She is able to tell me her nurse's name, where she lives, the month, but the year 2019. She does ask the same questions repeatedly. She is able to tell me that she has had a low heart rate "since I was a little girl." She says that her doctors have said not to worry about it as long as it is beating.  We then discussed more about if her heart were to stop beating. She tells me quickly that she believes in God and "I don't believe in life support." She tells me that she would not want resuscitation.   I called and spoke to her legal guardian, Edman Circle. Zigmund Daniel tells me that she has been with Ms. Nine for the past 5 years. Appears that Ms. Brocks is basically at her baseline but speech is a bit more slurred per Zigmund Daniel (although Ms. Branscom is very easy to understand). When we discussed code status Zigmund Daniel says that they have spoken a few times about this and that Ms. Bonds has been inconsistent with her desires for resuscitation and when they mentioned this to her grandson he was not supportive of DNR.  We agreed to  have outpatient palliative to follow to continue discussions with [name].   Primary Decision Maker LEGAL GUARDIAN Kay Flacks    SUMMARY OF RECOMMENDATIONS   - Outpatient palliative to follow  Code Status/Advance Care Planning:  Full code   Symptom Management:   No symptoms to address  Palliative Prophylaxis:   Aspiration, Delirium Protocol and Turn Reposition  Additional Recommendations (Limitations, Scope, Preferences):  Full Scope Treatment  Psycho-social/Spiritual:   Desire for further Chaplaincy support:no  Additional Recommendations: Caregiving  Support/Resources  Prognosis:   Unable to determine  Discharge Planning: Return to ALF with outpatient palliative to follow.       Primary Diagnoses: Present on Admission: . Bradycardia, sinus, persistent, severe   I have reviewed the medical record, interviewed the patient and family, and examined the patient. The following aspects are pertinent.  Past Medical History:  Diagnosis Date  . Arthritis   . CKD (chronic kidney disease), stage III   . 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 History of brain surgery 2001  .  History of depression   . Hypertension   . Sinus node dysfunction w/ competing jxnl rhythm    a. 07/2012 Ech: EF >55%, no rwma, mild MR/TR/AI, mildly dil LA.  . Tobacco abuse    a. 05/2017 currently smokes 2 cigarettes/day.  . Vascular dementia    Social History   Social History  . Marital status: Widowed    Spouse name: N/A  . Number of children: N/A  . Years of education: N/A   Social History Main Topics  . Smoking status: Current Every Day Smoker    Packs/day: 1.00    Years: 0.00    Types: Cigarettes  . Smokeless tobacco: Never Used     Comment: currently smoking 2 cigarettes/day  . Alcohol use No  . Drug use: No  . Sexual activity: Not Asked   Other Topics Concern  . None   Social History Narrative   Lives @ assisted living.  Bed/wheelchair  bound.   Family History  Problem Relation Age of Onset  . Family history unknown: Yes   Scheduled Meds: . acetaminophen  1,000 mg Oral Q12H  . atorvastatin  40 mg Oral q1800  . calcium-vitamin D  1 tablet Oral Daily  . Chlorhexidine Gluconate Cloth  6 each Topical Q0600  . citalopram  40 mg Oral Daily  . enoxaparin (LOVENOX) injection  30 mg Subcutaneous Q24H  . ferrous sulfate  325 mg Oral BID WC  . gabapentin  600 mg Oral BID  . hydrALAZINE  75 mg Oral TID  . isosorbide mononitrate  30 mg Oral Daily  . loratadine  10 mg Oral Daily  . mupirocin ointment  1 application Nasal BID  . pantoprazole (PROTONIX) IV  40 mg Intravenous Q12H  . polyethylene glycol  17 g Oral Daily   Continuous Infusions: . sodium chloride 75 mL/hr at 06/07/17 0922   PRN Meds:.acetaminophen **OR** acetaminophen (TYLENOL) oral liquid 160 mg/5 mL **OR** acetaminophen, atropine, hydrALAZINE, oxyCODONE-acetaminophen, senna-docusate Allergies  Allergen Reactions  . Ultram [Tramadol]     vomiting   Review of Systems  Neurological: Positive for speech difficulty.    Physical Exam  Constitutional: She is oriented to person, place, and time. She appears well-developed.  HENT:  Head: Normocephalic and atraumatic.  Cardiovascular: Bradycardia present.   Pulmonary/Chest: Effort normal and breath sounds normal. No accessory muscle usage. No tachypnea. No respiratory distress.  Abdominal: Normal appearance.  Neurological: She is alert and oriented to person, place, and time.  Nursing note and vitals reviewed.   Vital Signs: BP (!) 172/86   Pulse 72   Temp 97.8 F (36.6 C) (Oral)   Resp 19   Ht 5' 0.1" (1.527 m)   Wt 61.2 kg (135 lb)   SpO2 100%   BMI 26.28 kg/m  Pain Assessment: No/denies pain POSS *See Group Information*: 1-Acceptable,Awake and alert Pain Score: 0-No pain   SpO2: SpO2: 100 % O2 Device:SpO2: 100 % O2 Flow Rate: .   IO: Intake/output summary:  Intake/Output Summary (Last 24  hours) at 06/07/17 1513 Last data filed at 06/07/17 0700  Gross per 24 hour  Intake          1621.25 ml  Output                0 ml  Net          1621.25 ml    LBM: Last BM Date: 06/06/17 Baseline Weight: Weight: 58.1 kg (128 lb) Most recent weight: Weight: 61.2 kg (135 lb)  Palliative Assessment/Data:      Time Total: 60 min  Greater than 50%  of this time was spent counseling and coordinating care related to the above assessment and plan.  Signed by: Vinie Sill, NP Palliative Medicine Team Pager # [phone] (M-F 8a-5p) Team Phone # [phone] (Nights/Weekends)

## 2017-06-07 NOTE — Progress Notes (Addendum)
[facility] - [place] [facility]   PATIENT NAME: Joanna Stone    MR#:  [id number]  DATE OF BIRTH:  06-25-1941  SUBJECTIVE:  CHIEF COMPLAINT:   Chief Complaint  Patient presents with  . Code Stroke   No complaint. BP is high. The patient had dark bloody vomitus during CAT scan today.Heart rate is up to 120 to 130s. REVIEW OF SYSTEMS:  Review of Systems  Constitutional: Positive for malaise/fatigue. Negative for chills and fever.  HENT: Negative for sore throat.   Eyes: Negative for blurred vision and double vision.  Respiratory: Negative for cough, hemoptysis, shortness of breath, wheezing and stridor.   Cardiovascular: Negative for chest pain, palpitations, orthopnea and leg swelling.  Gastrointestinal: Negative for abdominal pain, blood in stool, diarrhea, melena, nausea and vomiting.  Genitourinary: Negative for dysuria, flank pain and hematuria.  Musculoskeletal: Negative for back pain and joint pain.  Neurological: Positive for weakness. Negative for dizziness, sensory change, speech change, focal weakness, seizures, loss of consciousness and headaches.  Endo/Heme/Allergies: Negative for polydipsia.  Psychiatric/Behavioral: Negative for depression. The patient is not nervous/anxious.     DRUG ALLERGIES:   Allergies  Allergen Reactions  . Ultram [Tramadol]     vomiting   VITALS:  Blood pressure (!) 172/86, pulse 72, temperature 97.8 F (36.6 C), temperature source Oral, resp. rate 19, height 5' 0.1" (1.527 m), weight 135 lb (61.2 kg), SpO2 100 %. PHYSICAL EXAMINATION:  Physical Exam  Constitutional: She is oriented to person, place, and time and well-developed, well-nourished, and in no distress.  HENT:  Head: Normocephalic.  Mouth/Throat: Oropharynx is clear and moist.  Eyes: Pupils are equal, round, and reactive to light. Conjunctivae and EOM are normal. No scleral icterus.  Neck: Normal range of motion. Neck supple. No JVD present. No  tracheal deviation present.  Cardiovascular: Normal rate, regular rhythm and normal heart sounds.  Exam reveals no gallop.   No murmur heard. Pulmonary/Chest: Effort normal and breath sounds normal. No respiratory distress. She has no wheezes. She has no rales.  Abdominal: Soft. Bowel sounds are normal. She exhibits no distension. There is no tenderness. There is no rebound.  Musculoskeletal: Normal range of motion. She exhibits no edema or tenderness.  Neurological: She is alert and oriented to person, place, and time.  Dysarthria, mild left facial droop Power 0/5 in left arm, 4/5 in right arm, 3/5 in both legs.  Skin: No rash noted. No erythema.  Psychiatric: Affect normal.   LABORATORY PANEL:  Female CBC  Recent Labs Lab 06/06/17 0409 06/07/17 1117  WBC 4.7  --   HGB 11.4* 14.1  HCT 34.5*  --   PLT 175  --    ------------------------------------------------------------------------------------------------------------------ Chemistries   Recent Labs Lab 06/03/17 1706  06/07/17 0539  NA 139  < > 139  K 4.8  < > 4.2  CL 108  < > 111  CO2 23  < > 22  GLUCOSE 104*  < > 93  BUN 37*  < > 23*  CREATININE 1.65*  < > 1.29*  CALCIUM 9.0  < > 8.5*  AST 23  --   --   ALT 16  --   --   ALKPHOS 53  --   --   BILITOT 0.4  --   --   < > = values in this interval not displayed. RADIOLOGY:  No results found. ASSESSMENT AND PLAN:   Acute CVA with worsening of dysarthria, history of CVA. Per MRI  brain, 7 mm acute infarct in the left insula. Small area of subacute infarct in the left temporoparietal white matter. Continue ASA, added plavix and lipitor, hold Pravachol.  Per Dr. Thad Ranger, neurology consult, CTA of the head and neck. Since patient's GFR is less than 30, cannot get CTA. Continue IV fluid and follow-up renal function per Dr. Thad Ranger.  PT and OT evaluation: need HHPT.  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  Per Vascular surgery consult Dr. Gilda Crease, If CT angiography is  not possible then conventional angiography can be performed with minimal contrast exposure. renal function is better, GFR 39.  I discussed with Dr. Gilda Crease, who agrees to CTA of head and neck:  25% diameter stenosis right internal carotid artery. 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 See above report.  GI bleeding. Hold aspirin and Plavix, nothing by mouth with IV fluid support, start Protonix IV every 12 hours, GI consult. Hb is 14.1. F/u Hb.  Sinus node dysfxn w/ jxnl bradycardia:  Asymptomatic.  Per Dr. Mariah Milling, Dr. Graciela Husbands recommends continue conservative rx given asymptomatic, comorbidities, disability, and hemodynamic stability.  Avoid AVN blocking agents.  Malignant hypertension. Continue her home medications hydralazine and hold enalapril. Start imdur. Iv hydralazine prn.  CKD stage 3, stable. continue NS iv, follow-up BMP. Tobacco abuse. Smoking cessation was counseled 4 minutes.  I discussed with Dr. Gilda Crease and cardiology NP.  Poor prognosis, palliative care consult. All the records are reviewed and case discussed with Care Management/Social Worker. Management plans discussed with the patient, her POA Ms. Flack, and they are in agreement.  CODE STATUS: Full Code  TOTAL TIME TAKING CARE OF THIS PATIENT: 45 minutes.   More than 50% of the time was spent in counseling/coordination of care: YES  POSSIBLE D/C IN 2 DAYS, DEPENDING ON CLINICAL CONDITION.   Shaune Pollack M.D on 06/07/2017 at 1:09 PM  Between 7am to 6pm - Pager - [phone]  After 6pm go to [email or web address] - Therapist, [facility] Hospitalists

## 2017-06-07 NOTE — Care Management (Signed)
Informed that patient is currently followed by [facility] - physical therapy. Updated liaison and discussed the possibility of palliative also following if discharges back to assisted living

## 2017-06-07 NOTE — Consult Note (Signed)
Wyline Mood MD, MRCP(U.K) [address]  [address]  [place], [place] [address]  Main: [phone]  Fax: [phone]  Consultation  Referring Provider:  Dr Imogene Burn Primary Care Physician:  Patient, No Pcp Per Primary Gastroenterologist:  None          Reason for Consultation:     Possible coffee ground emesis   Date of Admission:  06/03/2017 Date of Consultation:  06/07/2017         HPI:   Joanna Stone is a 76 y.o. female admitted in 06/07/17 with bradycardia, acute CVA with carotid artery stenosis noted on imaging .   Apparently when she went to CT scan she had dark red emesis and it smelt like blood. Hence I have been consulted. Hb 14.1 grams at 11 am today .  Patient dos admit to one episode of emesis but cant recall any blood in it. Presently denies any abdominal pain or further emesis.    Past Medical History:  Diagnosis Date  . Arthritis   . CKD (chronic kidney disease), stage III   . 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 History of brain surgery 2001  . History of depression   . Hypertension   . Sinus node dysfunction w/ competing jxnl rhythm    a. 07/2012 Ech: EF >55%, no rwma, mild MR/TR/AI, mildly dil LA.  . Tobacco abuse    a. 05/2017 currently smokes 2 cigarettes/day.  . Vascular dementia     Past Surgical History:  Procedure Laterality Date  . ABDOMINAL HYSTERECTOMY    . APPENDECTOMY    . TONSILLECTOMY      Prior to Admission medications   Medication Sig Start Date End Date Taking? Authorizing Provider  acetaminophen (MAPAP) 500 MG tablet Take 1,000 mg by mouth every 12 (twelve) hours. May take an additional every 6 hours if needed.   Yes [provider]  aspirin 81 MG tablet Take 81 mg by mouth daily.   Yes [provider]  Calcium Carbonate-Vitamin D (CALCIUM 600+D) 600-400 MG-UNIT per tablet Take 1 tablet by mouth daily.    Yes [provider]  citalopram (CELEXA) 40 MG tablet Take 40 mg by  mouth daily.   Yes [provider]  enalapril (VASOTEC) 10 MG tablet Take 10 mg by mouth daily.   Yes [provider]  ferrous sulfate 325 (65 FE) MG tablet Take 325 mg by mouth 2 (two) times daily with a meal.   Yes [provider]  fexofenadine (ALLEGRA) 180 MG tablet Take 180 mg by mouth daily.   Yes [provider]  gabapentin (NEURONTIN) 300 MG capsule Take 600 mg by mouth 3 (three) times daily.    Yes [provider]  hydrALAZINE (APRESOLINE) 50 MG tablet Take 50 mg by mouth 3 (three) times daily.   Yes [provider]  lovastatin (MEVACOR) 20 MG tablet Take 20 mg by mouth at bedtime.   Yes [provider]  pantoprazole (PROTONIX) 20 MG tablet Take 20 mg by mouth daily.   Yes [provider]  polyethylene glycol (MIRALAX / GLYCOLAX) packet Take 17 g by mouth daily.   Yes [provider]  oxyCODONE-acetaminophen (PERCOCET) 5-325 MG tablet Take 2 tablets by mouth every 6 (six) hours as needed for moderate pain or severe pain. Patient not taking: Reported on 06/03/2017 08/20/16   Emily Filbert, MD    Family History  Problem Relation Age of Onset  .  Family history unknown: Yes     Social History  Substance Use Topics  . Smoking status: Current Every Day Smoker    Packs/day: 1.00    Years: 0.00    Types: Cigarettes  . Smokeless tobacco: Never Used     Comment: currently smoking 2 cigarettes/day  . Alcohol use No    Allergies as of 06/03/2017 - Review Complete 06/03/2017  Allergen Reaction Noted  . Ultram [tramadol]  08/06/2012    Review of Systems:    All systems reviewed and negative except where noted in HPI.   Physical Exam:  Vital signs in last 24 hours: Temp:  [97.5 F (36.4 C)-98.5 F (36.9 C)] 97.8 F (36.6 C) (09/27 1125) Pulse Rate:  [61-122] 72 (09/27 1230) Resp:  [16-19] 19 (09/27 1125) BP: (154-196)/(57-102) 172/86 (09/27 1230) SpO2:  [95 %-100 %] 100 % (09/27 1125) Last  BM Date: 06/06/17 General:   Pleasant, cooperative in NAD Head:  Normocephalic and atraumatic. Eyes:   No icterus.   Conjunctiva pink. PERRLA. Ears:  Normal auditory acuity. Neck:  Supple; no masses or thyroidomegaly Lungs: Respirations even and unlabored. Lungs clear to auscultation bilaterally.   No wheezes, crackles, or rhonchi.  Heart:  Regular rate and rhythm;  Without murmur, clicks, rubs or gallops Abdomen:  Soft, nondistended, nontender. Normal bowel sounds. No appreciable masses or hepatomegaly.  No rebound or guarding.  Rectal:  Not performed. Neurologic:  Alert and oriented x3;  grossly normal neurologically. Skin:  Intact without significant lesions or rashes. Cervical Nodes:  No significant cervical adenopathy. Psych:  Alert and cooperative. Normal affect.  LAB RESULTS:  Recent Labs  06/06/17 0409 06/07/17 1117  WBC 4.7  --   HGB 11.4* 14.1  HCT 34.5*  --   PLT 175  --    BMET  Recent Labs  06/06/17 0409 06/07/17 0539  NA 141 139  K 4.5 4.2  CL 113* 111  CO2 23 22  GLUCOSE 99 93  BUN 29* 23*  CREATININE 1.60* 1.29*  CALCIUM 8.4* 8.5*   LFT No results for input(s): PROT, ALBUMIN, AST, ALT, ALKPHOS, BILITOT, BILIDIR, IBILI in the last 72 hours. PT/INR No results for input(s): LABPROT, INR in the last 72 hours.  STUDIES: Ct Angio Head W Or Wo Contrast  Result Date: 06/07/2017 CLINICAL DATA:  Stroke EXAM: CT ANGIOGRAPHY HEAD AND NECK TECHNIQUE: Multidetector CT imaging of the head and neck was performed using the standard protocol during bolus administration of intravenous contrast. Multiplanar CT image reconstructions and MIPs were obtained to evaluate the vascular anatomy. Carotid stenosis measurements (when applicable) are obtained utilizing NASCET criteria, using the distal internal carotid diameter as the denominator. CONTRAST:  60 mL Isovue MRI 370 IV COMPARISON:  MRI and MRA head 06/04/2017.  CT head 06/03/2017 FINDINGS: CT HEAD FINDINGS Brain:  Moderate atrophy. Chronic right MCA infarct with volume loss right insula and external capsule and deep white matter. Compensatory enlargement right lateral ventricle. Chronic ischemia in the white matter bilaterally. Small acute infarct in the left insular lobe best seen on recent MRI. Negative for hemorrhage or mass. Vascular: Negative for hyperdense vessel Skull: Left pterional craniotomy.  No acute skull lesion Sinuses: Negative Orbits: Negative Review of the MIP images confirms the above findings CTA NECK FINDINGS Aortic arch: Mild atherosclerotic disease in the aortic arch without aneurysm or dissection. Proximal great vessels widely patent. Three vessel aortic arch. Right carotid system: Right common carotid artery widely patent. Atherosclerotic plaque in the carotid bulb with  25% diameter stenosis of the internal carotid artery lumen. Moderate stenosis origin of the right external carotid artery. Left carotid system: Left common carotid artery widely patent. Calcific atherosclerotic plaque in the left carotid bulb without significant stenosis. External carotid artery widely patent Vertebral arteries: Mild stenosis at the origin of the right vertebral artery. Right vertebral artery is patent to the basilar without significant stenosis. Left vertebral artery patent to the basilar without significant stenosis. Skeleton: Moderate cervical spine degenerative change. No acute skeletal abnormality. Other neck: Negative Upper chest: Lung apices clear Review of the MIP images confirms the above findings CTA HEAD FINDINGS Anterior circulation: Right cavernous carotid is diffusely diseased with calcification and mild stenosis. Diffuse atherosclerotic disease and mild narrowing throughout the right M1 segment. Apparent chronic occlusion of the superior division of the right middle cerebral artery unchanged from the prior study Left cavernous carotid artery diffusely calcified with mild stenosis. 1.5 mm infundibulum of  the posterior communicating artery origin corresponds to the MRA finding. Left M1 segment patent. Left middle cerebral artery branches patent without significant stenosis. Both anterior cerebral arteries are patent. Posterior circulation: Both vertebral arteries patent to the basilar. PICA patent bilaterally. Mild atherosclerotic disease in the basilar without stenosis. Superior cerebellar artery is patent. Atherosclerotic disease and mild stenosis in the P1 segment bilaterally. Venous sinuses: Patent Anatomic variants: None Delayed phase: Normal enhancement following contrast administration. Review of the MIP images confirms the above findings IMPRESSION: Atrophy and extensive chronic ischemic changes. Chronic right MCA infarct. No acute infarct on CT. Small acute infarct left insula identified on recent MRI 25% diameter stenosis right internal carotid artery. Atherosclerotic disease throughout the cavernous carotid with mild stenosis No significant left internal carotid artery stenosis in the neck. Atherosclerotic disease and mild stenosis left cavernous carotid Right M1 segment diffusely diseased with apparent occlusion of the superior division of the right MCA likely chronic related to chronic right MCA infarct. Mild stenosis P1 segment bilaterally. Atherosclerotic disease basilar without significant stenosis. Mild stenosis origin right vertebral artery. Left vertebral artery widely patent. Probable 1.5 mm infundibulum right posterior communicating artery. Electronically Signed   By: Marlan Palau M.D.   On: 06/07/2017 13:09   Ct Angio Neck W Or Wo Contrast  Result Date: 06/07/2017 CLINICAL DATA:  Stroke EXAM: CT ANGIOGRAPHY HEAD AND NECK TECHNIQUE: Multidetector CT imaging of the head and neck was performed using the standard protocol during bolus administration of intravenous contrast. Multiplanar CT image reconstructions and MIPs were obtained to evaluate the vascular anatomy. Carotid stenosis  measurements (when applicable) are obtained utilizing NASCET criteria, using the distal internal carotid diameter as the denominator. CONTRAST:  60 mL Isovue MRI 370 IV COMPARISON:  MRI and MRA head 06/04/2017.  CT head 06/03/2017 FINDINGS: CT HEAD FINDINGS Brain: Moderate atrophy. Chronic right MCA infarct with volume loss right insula and external capsule and deep white matter. Compensatory enlargement right lateral ventricle. Chronic ischemia in the white matter bilaterally. Small acute infarct in the left insular lobe best seen on recent MRI. Negative for hemorrhage or mass. Vascular: Negative for hyperdense vessel Skull: Left pterional craniotomy.  No acute skull lesion Sinuses: Negative Orbits: Negative Review of the MIP images confirms the above findings CTA NECK FINDINGS Aortic arch: Mild atherosclerotic disease in the aortic arch without aneurysm or dissection. Proximal great vessels widely patent. Three vessel aortic arch. Right carotid system: Right common carotid artery widely patent. Atherosclerotic plaque in the carotid bulb with 25% diameter stenosis of the internal carotid artery  lumen. Moderate stenosis origin of the right external carotid artery. Left carotid system: Left common carotid artery widely patent. Calcific atherosclerotic plaque in the left carotid bulb without significant stenosis. External carotid artery widely patent Vertebral arteries: Mild stenosis at the origin of the right vertebral artery. Right vertebral artery is patent to the basilar without significant stenosis. Left vertebral artery patent to the basilar without significant stenosis. Skeleton: Moderate cervical spine degenerative change. No acute skeletal abnormality. Other neck: Negative Upper chest: Lung apices clear Review of the MIP images confirms the above findings CTA HEAD FINDINGS Anterior circulation: Right cavernous carotid is diffusely diseased with calcification and mild stenosis. Diffuse atherosclerotic disease  and mild narrowing throughout the right M1 segment. Apparent chronic occlusion of the superior division of the right middle cerebral artery unchanged from the prior study Left cavernous carotid artery diffusely calcified with mild stenosis. 1.5 mm infundibulum of the posterior communicating artery origin corresponds to the MRA finding. Left M1 segment patent. Left middle cerebral artery branches patent without significant stenosis. Both anterior cerebral arteries are patent. Posterior circulation: Both vertebral arteries patent to the basilar. PICA patent bilaterally. Mild atherosclerotic disease in the basilar without stenosis. Superior cerebellar artery is patent. Atherosclerotic disease and mild stenosis in the P1 segment bilaterally. Venous sinuses: Patent Anatomic variants: None Delayed phase: Normal enhancement following contrast administration. Review of the MIP images confirms the above findings IMPRESSION: Atrophy and extensive chronic ischemic changes. Chronic right MCA infarct. No acute infarct on CT. Small acute infarct left insula identified on recent MRI 25% diameter stenosis right internal carotid artery. Atherosclerotic disease throughout the cavernous carotid with mild stenosis No significant left internal carotid artery stenosis in the neck. Atherosclerotic disease and mild stenosis left cavernous carotid Right M1 segment diffusely diseased with apparent occlusion of the superior division of the right MCA likely chronic related to chronic right MCA infarct. Mild stenosis P1 segment bilaterally. Atherosclerotic disease basilar without significant stenosis. Mild stenosis origin right vertebral artery. Left vertebral artery widely patent. Probable 1.5 mm infundibulum right posterior communicating artery. Electronically Signed   By: Marlan Palau M.D.   On: 06/07/2017 13:09      Impression / Plan:   DEBRIA BROECKER is a 76 y.o. y/o female with questionable hematemesis. In the setting of an  acute stroke , endoscopy with be associated with high risks in terms of possible extension of the stroke if the blood pressure drops during the procedure from sedation . Since the Hemoglobin is stable and no overt signs of a bleed noted, the risks of the procedure at this time exceed the benefits. If she were to have further overt bleeding will need to reassess . In the meanwhile monitor her CBC , Place on IV ppi BID .   I will sign off.  Please call me if any further GI concerns or questions.  We would like to thank you for the opportunity to participate in the care of Joanna Stone.    Thank you for involving me in the care of this patient.      LOS: 4 days   Wyline Mood, MD  06/07/2017, 1:14 PM

## 2017-06-08 DIAGNOSIS — Z515 Encounter for palliative care: Secondary | ICD-10-CM

## 2017-06-08 DIAGNOSIS — Z7189 Other specified counseling: Secondary | ICD-10-CM

## 2017-06-08 LAB — CREATININE, SERUM
CREATININE: 1.51 mg/dL — AB (ref 0.44–1.00)
GFR, EST AFRICAN AMERICAN: 38 mL/min — AB (ref >60)
GFR, EST NON AFRICAN AMERICAN: 32 mL/min — AB (ref >60)

## 2017-06-08 LAB — HEMOGLOBIN: Hemoglobin: 12.1 g/dL (ref 12.0–16.0)

## 2017-06-08 MED ORDER — SODIUM CHLORIDE 0.9 % IV SOLN
INTRAVENOUS | Status: DC
  Administered 2017-06-08: 10:00:00 via INTRAVENOUS

## 2017-06-08 MED ORDER — PANTOPRAZOLE SODIUM 40 MG PO TBEC
40.0000 mg | DELAYED_RELEASE_TABLET | Freq: Two times a day (BID) | ORAL | Status: DC
  Administered 2017-06-08: 40 mg via ORAL

## 2017-06-08 MED ORDER — ATORVASTATIN CALCIUM 40 MG PO TABS: 40.0000 mg | ORAL_TABLET | Freq: Every day | ORAL | 2 refills | Status: DC

## 2017-06-08 MED ORDER — CLOPIDOGREL BISULFATE 75 MG PO TABS: 75.0000 mg | ORAL_TABLET | Freq: Every day | ORAL | 2 refills | Status: DC

## 2017-06-08 MED ORDER — ASPIRIN 81 MG PO CHEW
81.0000 mg | CHEWABLE_TABLET | Freq: Every day | ORAL | Status: DC
  Filled 2017-06-08: qty 1

## 2017-06-08 MED ORDER — CLOPIDOGREL BISULFATE 75 MG PO TABS
75.0000 mg | ORAL_TABLET | Freq: Every day | ORAL | Status: DC
  Filled 2017-06-08: qty 1

## 2017-06-08 NOTE — Progress Notes (Signed)
Pt to be dishcarged back to [facility]. Report called to facility. Ivs  and tele removed. Transport to be provided by ems.

## 2017-06-08 NOTE — Care Management (Signed)
Notified [facility] of discharge back to assisted living. Requested order from Attending to resume home health SN PT and outpatient palliative.  Patient remains full code.

## 2017-06-08 NOTE — Discharge Instructions (Signed)
Fall and aspiration precaution. HHPT Palliative care follow at ALF

## 2017-06-08 NOTE — Care Management Important Message (Signed)
Important Message  Patient Details  Name: Joanna Stone MRN: [id number] Date of Birth: 26-Jul-1941   Medicare Important Message Given:  Yes Signed IM notice given   Eber Hong, RN 06/08/2017, 9:25 AM

## 2017-06-08 NOTE — Discharge Summary (Addendum)
[facility]   PATIENT NAME: Joanna Stone    MR#:  [id number]  DATE OF BIRTH:  [birth year]  DATE OF ADMISSION:  06/03/2017   ADMITTING PHYSICIAN: Ramonita Lab, MD  DATE OF DISCHARGE: 06/08/2017 PRIMARY CARE PHYSICIAN: Patient, No Pcp Per   ADMISSION DIAGNOSIS:  Symptomatic bradycardia [R00.1] Transient cerebral ischemia, unspecified type [G45.9] DISCHARGE DIAGNOSIS:  Active Problems:   Bradycardia, sinus, persistent, severe   TIA (transient ischemic attack)   Atrial tachycardia (HCC)   Goals of care, counseling/discussion   Palliative care encounter  SECONDARY DIAGNOSIS:   Past Medical History:  Diagnosis Date  . Arthritis   . CKD (chronic kidney disease), stage III   . 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 History of brain surgery 2001  . History of depression   . Hypertension   . Sinus node dysfunction w/ competing jxnl rhythm    a. 07/2012 Ech: EF >55%, no rwma, mild MR/TR/AI, mildly dil LA.  . Tobacco abuse    a. 05/2017 currently smokes 2 cigarettes/day.  . Vascular dementia    HOSPITAL COURSE:   Acute CVA with worsening of dysarthria, history of CVA. Per MRI brain, 7 mm acute infarct in the left insula. Small area of subacute infarct in the left temporoparietal white matter. Continue ASA, added plavix and lipitor.   PT and OT evaluation: need HHPT.  Right carotid 50% -69% stenosis per [place], but  CTA: 25% diameter stenosis. Per Vascular surgery consult Dr. Gilda Crease, If CT angiography is not possible then conventional angiography can be performed with minimal contrast exposure. renal function is better, GFR 39.  I discussed with Dr. Gilda Crease, who agrees to CTA of head and neck:  25% diameter stenosis right internal carotid artery. 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 See above report.  GI bleeding, dark vomitus was possible coffee, not true GIB, no more  vomiting. Resume aspirin and Plavix, Hb is stable.  Sinus node dysfxn w/ jxnl bradycardia: Asymptomatic.  Per Dr. Mariah Milling, Dr. Graciela Husbands recommends continue conservative rx given asymptomatic, comorbidities, disability, and hemodynamic stability. Avoid AVN blocking agents. Follow up Dr. Graciela Husbands.  Malignant hypertension. Better controlled. Continue her home medications hydralazine and resume enalapril.  CKD stage 3, stable. Tobacco abuse. Smoking cessation was counseled 4 minutes.  I discussed with Dr. Kirke Corin. Palliative care follow at ALF DISCHARGE CONDITIONS:  Stable, discharge to ALF with HHPT today. CONSULTS OBTAINED:  Treatment Team:  Thana Farr, MD Annice Needy, MD DRUG ALLERGIES:   Allergies  Allergen Reactions  . Ultram [Tramadol]     vomiting   DISCHARGE MEDICATIONS:   Allergies as of 06/08/2017      Reactions   Ultram [tramadol]    vomiting      Medication List    STOP taking these medications   lovastatin 20 MG tablet Commonly known as:  MEVACOR     TAKE these medications   aspirin 81 MG tablet Take 81 mg by mouth daily.   atorvastatin 40 MG tablet Commonly known as:  LIPITOR Take 1 tablet (40 mg total) by mouth daily at 6 PM.   CALCIUM 600+D 600-400 MG-UNIT tablet Generic drug:  Calcium Carbonate-Vitamin D Take 1 tablet by mouth daily.   citalopram 40 MG tablet Commonly known as:  CELEXA Take 40 mg by mouth daily.   clopidogrel 75 MG tablet Commonly known as:  PLAVIX Take 1 tablet (75 mg total) by mouth daily.  enalapril 10 MG tablet Commonly known as:  VASOTEC Take 10 mg by mouth daily.   ferrous sulfate 325 (65 FE) MG tablet Take 325 mg by mouth 2 (two) times daily with a meal.   fexofenadine 180 MG tablet Commonly known as:  ALLEGRA Take 180 mg by mouth daily.   gabapentin 300 MG capsule Commonly known as:  NEURONTIN Take 600 mg by mouth 3 (three) times daily.   hydrALAZINE 50 MG tablet Commonly known as:   APRESOLINE Take 50 mg by mouth 3 (three) times daily.   MAPAP 500 MG tablet Generic drug:  acetaminophen Take 1,000 mg by mouth every 12 (twelve) hours. May take an additional every 6 hours if needed.   oxyCODONE-acetaminophen 5-325 MG tablet Commonly known as:  PERCOCET Take 2 tablets by mouth every 6 (six) hours as needed for moderate pain or severe pain.   pantoprazole 20 MG tablet Commonly known as:  PROTONIX Take 20 mg by mouth daily.   polyethylene glycol packet Commonly known as:  MIRALAX / GLYCOLAX Take 17 g by mouth daily.            Discharge Care Instructions        Start     Ordered   06/08/17 0000  Increase activity slowly     06/08/17 1351   06/08/17 0000  Diet - low sodium heart healthy     06/08/17 1351   06/08/17 0000  clopidogrel (PLAVIX) 75 MG tablet  Daily     06/08/17 1352   06/08/17 0000  atorvastatin (LIPITOR) 40 MG tablet  Daily-1800     06/08/17 1403       DISCHARGE INSTRUCTIONS:  See AVS. If you experience worsening of your admission symptoms, develop shortness of breath, life threatening emergency, suicidal or homicidal thoughts you must seek medical attention immediately by calling 911 or calling your MD immediately  if symptoms less severe.  You Must read complete instructions/literature along with all the possible adverse reactions/side effects for all the Medicines you take and that have been prescribed to you. Take any new Medicines after you have completely understood and accpet all the possible adverse reactions/side effects.   Please note  You were cared for by a hospitalist during your hospital stay. If you have any questions about your discharge medications or the care you received while you were in the hospital after you are discharged, you can call the unit and asked to speak with the hospitalist on call if the hospitalist that took care of you is not available. Once you are discharged, your primary care physician will handle any  further medical issues. Please note that NO REFILLS for any discharge medications will be authorized once you are discharged, as it is imperative that you return to your primary care physician (or establish a relationship with a primary care physician if you do not have one) for your aftercare needs so that they can reassess your need for medications and monitor your lab values.    On the day of Discharge:  VITAL SIGNS:  Blood pressure (!) 152/55, pulse (!) 41, temperature 98 F (36.7 C), temperature source Oral, resp. rate 18, height 5' 0.1" (1.527 m), weight 135 lb (61.2 kg), SpO2 97 %. PHYSICAL EXAMINATION:  GENERAL:  76 y.o.-year-old patient lying in the bed with no acute distress.  EYES: Pupils equal, round, reactive to light and accommodation. No scleral icterus. Extraocular muscles intact.  HEENT: Head atraumatic, normocephalic. Oropharynx and nasopharynx clear.  NECK:  Supple, no jugular venous distention. No thyroid enlargement, no tenderness.  LUNGS: Normal breath sounds bilaterally, no wheezing, rales,rhonchi or crepitation. No use of accessory muscles of respiration.  CARDIOVASCULAR: S1, S2 normal. No murmurs, rubs, or gallops.  ABDOMEN: Soft, non-tender, non-distended. Bowel sounds present. No organomegaly or mass.  EXTREMITIES: No pedal edema, cyanosis, or clubbing.  NEUROLOGIC: Cranial nerves II through XII are intact except mild slurred speech. Muscle strength 4/5 in all extremities except left arm 0/5. Sensation intact. Gait not checked.  PSYCHIATRIC: The patient is alert and oriented x 3.  SKIN: No obvious rash, lesion, or ulcer.  DATA REVIEW:   CBC  Recent Labs Lab 06/06/17 0409  06/08/17 0324  WBC 4.7  --   --   HGB 11.4*  < > 12.1  HCT 34.5*  --   --   PLT 175  --   --   < > = values in this interval not displayed.  Chemistries   Recent Labs Lab 06/03/17 1706  06/07/17 0539 06/08/17 0324  NA 139  < > 139  --   K 4.8  < > 4.2  --   CL 108  < > 111  --    CO2 23  < > 22  --   GLUCOSE 104*  < > 93  --   BUN 37*  < > 23*  --   CREATININE 1.65*  < > 1.29* 1.51*  CALCIUM 9.0  < > 8.5*  --   AST 23  --   --   --   ALT 16  --   --   --   ALKPHOS 53  --   --   --   BILITOT 0.4  --   --   --   < > = values in this interval not displayed.   Microbiology Results  Results for orders placed or performed during the hospital encounter of 06/03/17  MRSA PCR Screening     Status: None   Collection Time: 06/03/17 11:00 PM  Result Value Ref Range Status   MRSA by PCR NEGATIVE NEGATIVE Final    Comment:        The GeneXpert MRSA Assay (FDA approved for NASAL specimens only), is one component of a comprehensive MRSA colonization surveillance program. It is not intended to diagnose MRSA infection nor to guide or monitor treatment for MRSA infections.     RADIOLOGY:  No results found.   Management plans discussed with the patient, granddaughter and they are in agreement.  CODE STATUS: Full Code   TOTAL TIME TAKING CARE OF THIS PATIENT: 35 minutes.    Shaune Pollack M.D on 06/08/2017 at 1:56 PM  Between 7am to 6pm - Pager - [phone]  After 6pm go to [email or web address] - Social research officer, government  [facility]  Office  [phone]  CC: Primary care physician; Patient, No Pcp Per   Note: This dictation was prepared with Dragon dictation along with smaller phrase technology. Any transcriptional errors that result from this process are unintentional.

## 2017-06-08 NOTE — Progress Notes (Signed)
New referral for Palliative to follow at [facility] after discharge received from Palliative Medicine NP Yong Channel. CSW Minerva Areola made aware. Patient information faxed to referral. Thank you. Dayna Barker RN, BSN, [facility] [place], [facility] [phone] c

## 2017-06-08 NOTE — [place] FL2 (Signed)
[place] MEDICAID FL2 LEVEL OF CARE SCREENING TOOL     IDENTIFICATION  Patient Name: Joanna Stone Birthdate: 1940/09/17 Sex: female Admission Date (Current Location): 06/03/2017  [place] and [place] Number:  [address] [id number] [facility] Facility and Address:  [facility], [address] [place], [place] [address]      Provider Number: [id number]  Attending Physician Name and Address:  Shaune Pollack, MD  Relative Name and Phone Number:  [facility] Legal Guardian  [phone] or L,Queen Other  [phone] or Allison,Linda Other   [phone] or Love,Evelyn Relative [phone]     Current Level of Care: Hospital Recommended Level of Care: Assisted Living Facility [facility]) Prior Approval Number:    Date Approved/Denied:   PASRR Number:    Discharge Plan: Other (Comment) [facility])    Current Diagnoses: Patient Active Problem List   Diagnosis Date Noted  . Goals of care, counseling/discussion   . Palliative care encounter   . TIA (transient ischemic attack)   . Atrial tachycardia (HCC)   . Bradycardia, sinus, persistent, severe 06/03/2017  . Essential hypertension 07/03/2013  . sinus node dysfunction]   . Cerebrovascular accident (HCC)     Orientation RESPIRATION BLADDER Height & Weight     Self, Time, Situation, Place  Normal Incontinent Weight: 135 lb (61.2 kg) Height:  5' 0.1" (152.7 cm)  BEHAVIORAL SYMPTOMS/MOOD NEUROLOGICAL BOWEL NUTRITION STATUS      Incontinent Diet (Cardiac diet)  AMBULATORY STATUS COMMUNICATION OF NEEDS Skin   Supervision Verbally Normal                       Personal Care Assistance Level of Assistance  Bathing, Feeding, Dressing Bathing Assistance: Limited assistance Feeding assistance: Independent Dressing Assistance: Limited assistance     Functional Limitations Info  Sight, Hearing, Speech Sight Info: Adequate Hearing Info: Adequate Speech Info: Adequate    SPECIAL  CARE FACTORS FREQUENCY  PT (By licensed PT)     PT Frequency: Home Health 2x a week               Contractures Contractures Info: Not present    Additional Factors Info  Code Status, Allergies, Psychotropic Code Status Info: Full Code Allergies Info: ULTRAM TRAMADOL  Psychotropic Info: citalopram (CELEXA) tablet 40 mg         Current Medications (06/08/2017):  This is the current hospital active medication list Current Facility-Administered Medications  Medication Dose Route Frequency Provider Last Rate Last Dose  . 0.9 %  sodium chloride infusion   Intravenous Continuous Shaune Pollack, MD 75 mL/hr at 06/08/17 [phone]    . acetaminophen (TYLENOL) tablet 650 mg  650 mg Oral Q4H PRN Gouru, Aruna, MD       Or  . acetaminophen (TYLENOL) solution 650 mg  650 mg Per Tube Q4H PRN Gouru, Aruna, MD       Or  . acetaminophen (TYLENOL) suppository 650 mg  650 mg Rectal Q4H PRN Gouru, Aruna, MD      . acetaminophen (TYLENOL) tablet 1,000 mg  1,000 mg Oral Q12H Gouru, Aruna, MD   1,000 mg at 06/08/17 0934  . aspirin chewable tablet 81 mg  81 mg Oral Daily Shaune Pollack, MD      . atorvastatin (LIPITOR) tablet 40 mg  40 mg Oral q1800 Shaune Pollack, MD   40 mg at 06/07/17 1640  . atropine 1 MG/10ML injection 0.5 mg  0.5 mg Intravenous PRN Ramonita Lab, MD   0.5  mg at 06/04/17 0204  . calcium-vitamin D (OSCAL WITH D) 500-200 MG-UNIT per tablet 1 tablet  1 tablet Oral Daily Gouru, Aruna, MD   1 tablet at 06/08/17 0935  . citalopram (CELEXA) tablet 40 mg  40 mg Oral Daily Gouru, Aruna, MD   40 mg at 06/08/17 0934  . clopidogrel (PLAVIX) tablet 75 mg  75 mg Oral Daily Shaune Pollack, MD      . enoxaparin (LOVENOX) injection 30 mg  30 mg Subcutaneous Q24H Gouru, Aruna, MD   30 mg at 06/06/17 2113  . ferrous sulfate tablet 325 mg  325 mg Oral BID WC Gouru, Aruna, MD   325 mg at 06/08/17 0836  . gabapentin (NEURONTIN) capsule 600 mg  600 mg Oral BID Shaune Pollack, MD   600 mg at 06/08/17 0934  . hydrALAZINE  (APRESOLINE) injection 10 mg  10 mg Intravenous Q6H PRN Gouru, Aruna, MD   10 mg at 06/07/17 0915  . hydrALAZINE (APRESOLINE) tablet 75 mg  75 mg Oral TID Ok Anis, NP   75 mg at 06/08/17 0935  . isosorbide mononitrate (IMDUR) 24 hr tablet 30 mg  30 mg Oral Daily Shaune Pollack, MD   30 mg at 06/08/17 0934  . loratadine (CLARITIN) tablet 10 mg  10 mg Oral Daily Gouru, Aruna, MD   10 mg at 06/08/17 0934  . ondansetron (ZOFRAN) injection 4 mg  4 mg Intravenous Q6H PRN Shaune Pollack, MD      . oxyCODONE-acetaminophen (PERCOCET/ROXICET) 5-325 MG per tablet 2 tablet  2 tablet Oral Q6H PRN Gouru, Aruna, MD      . pantoprazole (PROTONIX) EC tablet 40 mg  40 mg Oral BID AC Shaune Pollack, MD      . polyethylene glycol (MIRALAX / GLYCOLAX) packet 17 g  17 g Oral Daily Gouru, Aruna, MD   17 g at 06/08/17 0934  . senna-docusate (Senokot-S) tablet 1 tablet  1 tablet Oral QHS PRN Ramonita Lab, MD         Discharge Medications: Please see discharge summary for a list of discharge medications.  STOP taking these medications           lovastatin 20 MG tablet Commonly known as:  MEVACOR                       TAKE these medications            aspirin 81 MG tablet Take 81 mg by mouth daily.    atorvastatin 40 MG tablet Commonly known as:  LIPITOR Take 1 tablet (40 mg total) by mouth daily at 6 PM.    CALCIUM 600+D 600-400 MG-UNIT tablet Generic drug:  Calcium Carbonate-Vitamin D Take 1 tablet by mouth daily.    citalopram 40 MG tablet Commonly known as:  CELEXA Take 40 mg by mouth daily.    clopidogrel 75 MG tablet Commonly known as:  PLAVIX Take 1 tablet (75 mg total) by mouth daily.    enalapril 10 MG tablet Commonly known as:  VASOTEC Take 10 mg by mouth daily.    ferrous sulfate 325 (65 FE) MG tablet Take 325 mg by mouth 2 (two) times daily with a meal.    fexofenadine 180 MG tablet Commonly known as:  ALLEGRA Take 180 mg by mouth daily.    gabapentin 300 MG  capsule Commonly known as:  NEURONTIN Take 600 mg by mouth 3 (three) times daily.    hydrALAZINE 50 MG tablet Commonly  known as:  APRESOLINE Take 50 mg by mouth 3 (three) times daily.    MAPAP 500 MG tablet Generic drug:  acetaminophen Take 1,000 mg by mouth every 12 (twelve) hours. May take an additional every 6 hours if needed.    oxyCODONE-acetaminophen 5-325 MG tablet Commonly known as:  PERCOCET Take 2 tablets by mouth every 6 (six) hours as needed for moderate pain or severe pain.    pantoprazole 20 MG tablet Commonly known as:  PROTONIX Take 20 mg by mouth daily.    polyethylene glycol packet Commonly known as:  MIRALAX / GLYCOLAX Take 17 g by mouth daily.                                         Discharge Care Instructions               Start     Ordered   06/08/17 0000  Increase activity slowly     06/08/17 1351   06/08/17 0000  Diet - low sodium heart healthy     06/08/17 1351   06/08/17 0000  clopidogrel (PLAVIX) 75 MG tablet  Daily     06/08/17 1352   06/08/17 0000  atorvastatin (LIPITOR) 40 MG tablet  Daily-1800     06/08/17 1403         Relevant Imaging Results:  Relevant Lab Results:   Additional Information SSN [id number]  Darleene Cleaver, [place]

## 2017-06-08 NOTE — Progress Notes (Signed)
Occupational Therapy Treatment Patient Details Name: Joanna Stone MRN: [id number] DOB: 1941/01/14 Today's Date: 06/08/2017    History of present illness Pt is a 76 y.o.femalewith a history of asymptomatic bradycardia, CKD III-IV, CVA with left-sided weakness and dysarthria, HTN, tob abuse, and chronic bradycardia. Presented to ED on 9/23 for increased slurred speech. MRI indicates 7 mm acute infarct in the left insula and small area of subacute infarct in the left temporoparietal white matter.   OT comments  Pt. continues to require assist with all set-up of light ADL tasks, self-grooming tasks,a nd meals. Pt. education was provided about positioning. Pt. was distracted by, and perseverated on her coffee, meal tray set-up, and her children. Pt. continues to benefit from OT services for ADL training, there. Ex, positioning, and functional mobility. Pt. plans to return to [facility].   Follow Up Recommendations  Home health OT    Equipment Recommendations       Recommendations for Other Services      Precautions / Restrictions Precautions Precautions: Fall Restrictions Weight Bearing Restrictions: No                                                     ADL either performed or assessed with clinical judgement   ADL Overall ADL's : Needs assistance/impaired Eating/Feeding: Bed level;Set up   Grooming: Bed level;Set up                                       Vision  [facility]     Perception     Praxis      Cognition Arousal/Alertness: Awake/alert   Overall Cognitive Status: No family/caregiver present to determine baseline cognitive functioning                                 General Comments: Pt. is perseverating on her cup of coffee, and children        Exercises     Shoulder Instructions       General Comments      Pertinent Vitals/ Pain          Home Living Family/patient expects to be  discharged to:: Assisted living ([facility])                             Home Equipment: Wheelchair - manual          Prior Functioning/Environment Level of Independence: Needs assistance            Frequency  Min 2X/week        Progress Toward Goals  OT Goals(current goals can now be found in the care plan section)        Plan Discharge plan remains appropriate    Co-evaluation                 AM-PAC PT "6 Clicks" Daily Activity     Outcome Measure   Help from another person eating meals?: A Little Help from another person taking care of personal grooming?: A Little Help from another person toileting, which includes using toliet, bedpan, or urinal?: A Lot Help from  another person bathing (including washing, rinsing, drying)?: A Lot Help from another person to put on and taking off regular upper body clothing?: A Lot Help from another person to put on and taking off regular lower body clothing?: A Lot 6 Click Score: 14    End of Session    OT Visit Diagnosis: Other abnormalities of gait and mobility (R26.89);Other symptoms and signs involving cognitive function   Activity Tolerance Patient tolerated treatment well   Patient Left in bed;with call bell/phone within reach;with bed alarm set   Nurse Communication      Functional Assessment Tool Used: Clinical judgement;AM-PAC 6 Clicks Daily Activity Functional Limitation: Self care Self Care Current Status (B1478): At least 40 percent but less than 60 percent impaired, limited or restricted Self Care Goal Status (G9562): At least 20 percent but less than 40 percent impaired, limited or restricted   Time: 1330-1348 OT Time Calculation (min): 18 min  Charges: OT G-codes **NOT FOR INPATIENT CLASS** Functional Assessment Tool Used: Clinical judgement;AM-PAC 6 Clicks Daily Activity Functional Limitation: Self care Self Care Current Status (Z3086): At least 40 percent but less than 60  percent impaired, limited or restricted Self Care Goal Status (V7846): At least 20 percent but less than 40 percent impaired, limited or restricted OT General Charges $OT Visit: 1 Visit OT Treatments $Self Care/Home Management : 8-22 mins  Olegario Messier, MS, OTR/L    Olegario Messier, MS, OTR/L 06/08/2017, 2:42 PM

## 2017-06-08 NOTE — Progress Notes (Signed)
To whom it may concern.     This is to certify that Joanna Stone was a dmitted to [facility] on 23rd of September and has been an inpatient until today.

## 2017-06-08 NOTE — Clinical Social Work Note (Signed)
Patient to be d/c'ed today to [facility].  Patient and family agreeable to plans will transport via ems RN to call report.  Windell Moulding, MSW, [facility] [phone]

## 2017-06-26 ENCOUNTER — Encounter: Payer: Self-pay | Admitting: Internal Medicine

## 2017-06-26 ENCOUNTER — Ambulatory Visit (INDEPENDENT_AMBULATORY_CARE_PROVIDER_SITE_OTHER): Payer: Medicare Other | Admitting: Internal Medicine

## 2017-06-26 VITALS — BP 140/60 | HR 51 | Ht 61.0 in | Wt 133.0 lb

## 2017-06-26 DIAGNOSIS — R001 Bradycardia, unspecified: Secondary | ICD-10-CM

## 2017-06-26 DIAGNOSIS — I10 Essential (primary) hypertension: Secondary | ICD-10-CM

## 2017-06-26 DIAGNOSIS — I63529 Cerebral infarction due to unspecified occlusion or stenosis of unspecified anterior cerebral artery: Secondary | ICD-10-CM | POA: Diagnosis not present

## 2017-06-26 NOTE — Patient Instructions (Signed)
Medication Instructions:  Your physician recommends that you continue on your current medications as directed. Please refer to the Current Medication list given to you today.   Labwork: none  Testing/Procedures: none  Follow-Up: Your physician recommends that you schedule a follow-up appointment as needed with Dr. Klein   Any Other Special Instructions Will Be Listed Below (If Applicable).     If you need a refill on your cardiac medications before your next appointment, please call your pharmacy.   

## 2017-06-26 NOTE — Progress Notes (Signed)
[email or web address]      Patient Care Team: Patient, No Pcp Per as PCP - General (General Practice)   HPI  Joanna Stone is a 76 y.o. female Seen in followup for bradycardia.  She was hospitalized 9/18 for a stroke. Slurred speech normalized over a couple of days. she is largely wheelchair-bound and is nonambulatory. She is found again to have bradycardia. Heart rates were noted on telemetry in the high 20s up to the low 40s. Blood pressures were notably elevated.  She was found to have modest carotid stenosis on the left. She underwent CTA which failed to demonstrate a left-sided stenosis. She 25% stenosis on the right.  Echo 9/18 EF 55-60 %   She is wheelchair-bound. She has no episodes of lightheadedness of which she is aware.She denies chest pain shortness of breath or edema. She has no recollection as to why she is in the hospital.     Past Medical History:  Diagnosis Date  . Arthritis   . CKD (chronic kidney disease), stage III (HCC)   . 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 History of brain surgery 2001  . History of depression   . Hypertension   . Sinus node dysfunction w/ competing jxnl rhythm    a. 07/2012 Ech: EF >55%, no rwma, mild MR/TR/AI, mildly dil LA.  . Tobacco abuse    a. 05/2017 currently smokes 2 cigarettes/day.  . Vascular dementia     Past Surgical History:  Procedure Laterality Date  . ABDOMINAL HYSTERECTOMY    . APPENDECTOMY    . TONSILLECTOMY      Current Outpatient Prescriptions  Medication Sig Dispense Refill  . acetaminophen (MAPAP) 500 MG tablet Take 1,000 mg by mouth every 12 (twelve) hours. May take an additional every 6 hours if needed.    Marland Kitchen aspirin 81 MG tablet Take 81 mg by mouth daily.    Marland Kitchen atorvastatin (LIPITOR) 40 MG tablet Take 1 tablet (40 mg total) by mouth daily at 6 PM. 30 tablet 2  . Calcium Carbonate-Vitamin D (CALCIUM 600+D) 600-400 MG-UNIT per tablet Take 1 tablet by mouth daily.     . citalopram (CELEXA)  40 MG tablet Take 40 mg by mouth daily.    . clopidogrel (PLAVIX) 75 MG tablet Take 1 tablet (75 mg total) by mouth daily. 30 tablet 2  . enalapril (VASOTEC) 10 MG tablet Take 10 mg by mouth daily.    . ferrous sulfate 325 (65 FE) MG tablet Take 325 mg by mouth 2 (two) times daily with a meal.    . fexofenadine (ALLEGRA) 180 MG tablet Take 180 mg by mouth daily.    Marland Kitchen gabapentin (NEURONTIN) 300 MG capsule Take 600 mg by mouth 3 (three) times daily.     . hydrALAZINE (APRESOLINE) 50 MG tablet Take 50 mg by mouth 3 (three) times daily.    . pantoprazole (PROTONIX) 20 MG tablet Take 20 mg by mouth daily.    . polyethylene glycol (MIRALAX / GLYCOLAX) packet Take 17 g by mouth daily.     No current facility-administered medications for this visit.     Allergies  Allergen Reactions  . Ultram [Tramadol]     vomiting    Review of Systems negative except from HPI and PMH  Physical Exam BP 140/60 (BP Location: Left Arm, Patient Position: Sitting, Cuff Size: Normal)   Pulse (!) 51   Ht  (1.549 m)   Wt 133 lb (60.3 kg)  BMI 25.13 kg/m  Well developed and cachectic sitting in a wheelchair Breath sounds decreased some sonorous soundes Irregular rate and rhythm 2/6 systolic mur with preserved S2 Soft   No edema Left-sided weakness    ECG demonstrates a narrow QRS rhythm at 51 with intermittent p waves  Assessment and  Plan   Bradycardia-sinus  Syncope-remote  Hypertension  Stroke    The pt has as best I can discern asymptomatic albeit profound bradycardia.  As she in non ambulatory there is little risk of falling as potential untoward, hence, we will continue to follow and be available if she were to deverlp attributable symptoms   BP is reasonably controlled   She is on ASA Plavix p CVA  No indication for anticoagulation

## 2018-12-03 ENCOUNTER — Other Ambulatory Visit: Payer: Self-pay

## 2018-12-03 ENCOUNTER — Emergency Department
Admission: EM | Admit: 2018-12-03 | Discharge: 2018-12-04 | Disposition: A | Discharge status: Home / Self Care | Payer: Medicare Other | Attending: Emergency Medicine | Admitting: Emergency Medicine

## 2018-12-03 ENCOUNTER — Emergency Department: Payer: Medicare Other

## 2018-12-03 ENCOUNTER — Encounter: Payer: Self-pay | Admitting: Emergency Medicine

## 2018-12-03 DIAGNOSIS — I129 Hypertensive chronic kidney disease with stage 1 through stage 4 chronic kidney disease, or unspecified chronic kidney disease: Secondary | ICD-10-CM | POA: Diagnosis not present

## 2018-12-03 DIAGNOSIS — Z8673 Personal history of transient ischemic attack (TIA), and cerebral infarction without residual deficits: Secondary | ICD-10-CM | POA: Insufficient documentation

## 2018-12-03 DIAGNOSIS — Y999 Unspecified external cause status: Secondary | ICD-10-CM | POA: Diagnosis not present

## 2018-12-03 DIAGNOSIS — Z79899 Other long term (current) drug therapy: Secondary | ICD-10-CM | POA: Diagnosis not present

## 2018-12-03 DIAGNOSIS — S0083XA Contusion of other part of head, initial encounter: Secondary | ICD-10-CM | POA: Insufficient documentation

## 2018-12-03 DIAGNOSIS — Y929 Unspecified place or not applicable: Secondary | ICD-10-CM | POA: Insufficient documentation

## 2018-12-03 DIAGNOSIS — N183 Chronic kidney disease, stage 3 (moderate): Secondary | ICD-10-CM | POA: Insufficient documentation

## 2018-12-03 DIAGNOSIS — F17210 Nicotine dependence, cigarettes, uncomplicated: Secondary | ICD-10-CM | POA: Diagnosis not present

## 2018-12-03 DIAGNOSIS — Y939 Activity, unspecified: Secondary | ICD-10-CM | POA: Insufficient documentation

## 2018-12-03 DIAGNOSIS — Z7982 Long term (current) use of aspirin: Secondary | ICD-10-CM | POA: Insufficient documentation

## 2018-12-03 DIAGNOSIS — W050XXA Fall from non-moving wheelchair, initial encounter: Secondary | ICD-10-CM | POA: Diagnosis not present

## 2018-12-03 DIAGNOSIS — S0990XA Unspecified injury of head, initial encounter: Secondary | ICD-10-CM | POA: Diagnosis present

## 2018-12-03 DIAGNOSIS — W19XXXA Unspecified fall, initial encounter: Secondary | ICD-10-CM

## 2018-12-03 DIAGNOSIS — Z7901 Long term (current) use of anticoagulants: Secondary | ICD-10-CM | POA: Diagnosis not present

## 2018-12-03 HISTORY — DX: Bradycardia, unspecified: R00.1

## 2018-12-03 LAB — CBC
HCT: 34.6 % — ABNORMAL LOW (ref 36.0–46.0)
HEMOGLOBIN: 11 g/dL — AB (ref 12.0–15.0)
MCH: 32.3 pg (ref 26.0–34.0)
MCHC: 31.8 g/dL (ref 30.0–36.0)
MCV: 101.5 fL — ABNORMAL HIGH (ref 80.0–100.0)
Platelets: 226 10*3/uL (ref 150–400)
RBC: 3.41 MIL/uL — AB (ref 3.87–5.11)
RDW: 13.1 % (ref 11.5–15.5)
WBC: 6.4 10*3/uL (ref 4.0–10.5)
nRBC: 0 % (ref 0.0–0.2)

## 2018-12-03 LAB — LIPASE, BLOOD: Lipase: 23 U/L (ref 11–51)

## 2018-12-03 LAB — HEPATIC FUNCTION PANEL
ALK PHOS: 61 U/L (ref 38–126)
ALT: 14 U/L (ref 0–44)
AST: 18 U/L (ref 15–41)
Albumin: 3.4 g/dL — ABNORMAL LOW (ref 3.5–5.0)
BILIRUBIN TOTAL: 0.4 mg/dL (ref 0.3–1.2)
Total Protein: 6.4 g/dL — ABNORMAL LOW (ref 6.5–8.1)

## 2018-12-03 LAB — TROPONIN I: Troponin I: <0.03 ng/mL (ref <0.03)

## 2018-12-03 LAB — BASIC METABOLIC PANEL
Anion gap: 6 (ref 5–15)
BUN: 41 mg/dL — ABNORMAL HIGH (ref 8–23)
CALCIUM: 8.5 mg/dL — AB (ref 8.9–10.3)
CO2: 23 mmol/L (ref 22–32)
Chloride: 111 mmol/L (ref 98–111)
Creatinine, Ser: 1.57 mg/dL — ABNORMAL HIGH (ref 0.44–1.00)
GFR, EST AFRICAN AMERICAN: 36 mL/min — AB (ref >60)
GFR, EST NON AFRICAN AMERICAN: 31 mL/min — AB (ref >60)
Glucose, Bld: 128 mg/dL — ABNORMAL HIGH (ref 70–99)
Potassium: 4.4 mmol/L (ref 3.5–5.1)
SODIUM: 140 mmol/L (ref 135–145)

## 2018-12-03 LAB — MAGNESIUM: Magnesium: 2.3 mg/dL (ref 1.7–2.4)

## 2018-12-03 MED ORDER — OXYCODONE-ACETAMINOPHEN 5-325 MG PO TABS
1.0000 | ORAL_TABLET | Freq: Once | ORAL | Status: AC
  Administered 2018-12-03: 1 via ORAL
  Filled 2018-12-03: qty 1

## 2018-12-03 NOTE — ED Notes (Signed)
PTs necklaces are in a bag in her shoe.

## 2018-12-03 NOTE — ED Triage Notes (Addendum)
Pt was sitting in a wheelchair and she fell out of it while smoking outside. Pt has a large hematoma noted to the right side of forehead. Pt stated that she just fell,  Staff from [facility] stated that pt just fell. Pt is A&OX4. Pt is on Plavix.

## 2018-12-03 NOTE — ED Provider Notes (Signed)
[facility] Emergency Department Provider Note  ____________________________________________   First MD Initiated Contact with Patient 12/03/18 2303     (approximate)  I have reviewed the triage vital signs and the nursing notes.   HISTORY  Chief Complaint Fall    Level 5 caveat:  history/ROS limited by chronic dementia   HPI Joanna Stone is a 78 y.o. female whose medical history notably includes vascular dementia, prior CVA on aspirin and Plavix, nonambulatory state (wheelchair-bound), persistent tobacco use, and chronic bradycardia (as per the medical record, see below for details).  She presents by EMS for evaluation of head injury.  Reportedly she was sitting in her wheelchair smoking and she states that she turned around in the wheelchair to go back inside and somehow fell out and hit the ground with her forehead.  She does not think she lost consciousness.  She said her head hurts and nothing else.  She is breathing easily and comfortably and denies chest pain and shortness of breath and abdominal pain.  She is not able to provide any additional history.  She states that her headache is moderate and that she would like a Percocet and that she is allergic to Ultram and cannot take anything by Percocet.        Past Medical History:  Diagnosis Date   Arthritis    Bradycardia    seen in 2018 by Dr. Graciela Husbands (cardiology/electrophysiology), no intervention recommended   CKD (chronic kidney disease), stage III (HCC)    CVA (cerebral infarction) 1995   left sided hemiparesis    History of brain surgery 2001   History of depression    Hypertension    Sinus node dysfunction w/ competing jxnl rhythm    a. 07/2012 Ech: EF >55%, no rwma, mild MR/TR/AI, mildly dil LA.   Tobacco abuse    a. 05/2017 currently smokes 2 cigarettes/day.   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)     Past Surgical History:  Procedure Laterality Date   ABDOMINAL HYSTERECTOMY     APPENDECTOMY     TONSILLECTOMY      Prior to Admission medications   Medication Sig Start Date End Date Taking? Authorizing Provider  acetaminophen (MAPAP) 500 MG tablet Take 1,000 mg by mouth every 12 (twelve) hours. May take an additional every 6 hours if needed.    [provider]  aspirin 81 MG tablet Take 81 mg by mouth daily.    [provider]  atorvastatin (LIPITOR) 40 MG tablet Take 1 tablet (40 mg total) by mouth daily at 6 PM. 06/08/17   Shaune Pollack, MD  Calcium Carbonate-Vitamin D (CALCIUM 600+D) 600-400 MG-UNIT per tablet Take 1 tablet by mouth daily.     [provider]  citalopram (CELEXA) 40 MG tablet Take 40 mg by mouth daily.    [provider]  clopidogrel (PLAVIX) 75 MG tablet Take 1 tablet (75 mg total) by mouth daily. 06/08/17   Shaune Pollack, MD  enalapril (VASOTEC) 10 MG tablet Take 10 mg by mouth daily.    [provider]  ferrous sulfate 325 (65 FE) MG tablet Take 325 mg  by mouth 2 (two) times daily with a meal.    [provider]  fexofenadine (ALLEGRA) 180 MG tablet Take 180 mg by mouth daily.    [provider]  gabapentin (NEURONTIN) 300 MG capsule Take 600 mg by mouth 3 (three) times daily.     [provider]  hydrALAZINE (APRESOLINE) 50 MG tablet Take 50 mg by mouth 3 (three) times daily.    [provider]  pantoprazole (PROTONIX) 20 MG tablet Take 20 mg by mouth daily.    [provider]  polyethylene glycol (MIRALAX / GLYCOLAX) packet Take 17 g by mouth daily.    [provider]    Allergies Ultram  [tramadol]  Family History  Family history unknown: Yes    Social History Social History   Tobacco Use   Smoking status: Current Every Day Smoker    Packs/day: 1.00    Years: 0.00    Pack years: 0.00    Types: Cigarettes   Smokeless tobacco: Never Used   Tobacco comment: currently smoking 2 cigarettes/day  Substance Use Topics   Alcohol use: No   Drug use: No    Review of Systems Level 5 caveat:  history/ROS limited by chronic dementia  The patient reports a headache after fall as described above.  She denies chest pain, shortness of breath, nausea, vomiting, and abdominal pain.  ____________________________________________   PHYSICAL EXAM:  VITAL SIGNS: ED Triage Vitals  Enc Vitals Group     BP 12/03/18 2242 (!) 149/50     Pulse Rate 12/03/18 2242 (!) 55     Resp 12/03/18 2242 16     Temp 12/03/18 2242 98.1 F (36.7 C)     Temp src --      SpO2 12/03/18 2242 96 %     Weight 12/03/18 2243 56.7 kg (125 lb)     Height 12/03/18 2243 1.549 m ( )     Head Circumference --      Peak Flow --      Pain Score 12/03/18 2243 7     Pain Loc --      Pain Edu? --      Excl. in GC? --     Constitutional: Alert and oriented to self and to location (she knows she is in the hospital).  No distress. Eyes: Conjunctivae are normal. PERRL. EOMI. Head: Large hematoma with abrasion to the right side of her forehead, no laceration, no other injury is evident. Nose: No congestion/rhinnorhea. Mouth/Throat: Mucous membranes are moist. Neck: No stridor.  No meningeal signs.  No cervical spine tenderness to palpation. Cardiovascular: Good peripheral perfusion but significant bradycardia with a heart rate between the 20s and 40s, irregular rhythm. Respiratory: Normal respiratory effort.  No retractions. Lungs CTAB. Gastrointestinal: Soft and nontender. No distention.  Musculoskeletal: No lower extremity tenderness nor edema. No gross deformities of extremities.  No evidence of  extremity injury after her fall, normal range of motion and no reproducible pain/tenderness. Neurologic:  Normal speech and language. No gross focal neurologic deficits are appreciated.  Skin:  Skin is warm, dry and intact. No rash noted.   ____________________________________________   LABS (all labs ordered are listed, but only abnormal results are displayed)  Labs Reviewed  CBC - Abnormal; Notable for the following components:      Result Value   RBC 3.41 (*)    Hemoglobin 11.0 (*)    HCT 34.6 (*)    MCV 101.5 (*)    All other  components within normal limits  BASIC METABOLIC PANEL - Abnormal; Notable for the following components:   Glucose, Bld 128 (*)    BUN 41 (*)    Creatinine, Ser 1.57 (*)    Calcium 8.5 (*)    GFR calc non Af Amer 31 (*)    GFR calc Af Amer 36 (*)    All other components within normal limits  HEPATIC FUNCTION PANEL - Abnormal; Notable for the following components:   Total Protein 6.4 (*)    Albumin 3.4 (*)    All other components within normal limits  TROPONIN I  MAGNESIUM  LIPASE, BLOOD   ____________________________________________  EKG  ED ECG REPORT I, Loleta Rose, the attending physician, personally viewed and interpreted this ECG.  Date: 12/03/2018 EKG Time: 23: 06 Rate: 43 Rhythm: Junctional rhythm versus atrial fibrillation with bradycardia QRS Axis: normal Intervals: Nonspecific intraventricular conduction delay ST/T Wave abnormalities: Non-specific ST segment / T-wave changes, but no clear evidence of acute ischemia. Narrative Interpretation: no definitive evidence of acute ischemia; does not meet STEMI criteria.  ED ECG REPORT I, Loleta Rose, the attending physician, personally viewed and interpreted this ECG.  Date: 12/03/2018 EKG Time: 23: 50 Rate: 37 Rhythm: Junctional rhythm QRS Axis: normal Intervals: normal ST/T Wave abnormalities: Non-specific ST segment / T-wave changes, but no clear evidence of acute  ischemia. Narrative Interpretation: no definitive evidence of acute ischemia; does not meet STEMI criteria.     ____________________________________________  RADIOLOGY   ED MD interpretation: No evidence of any acute injury on CT head nor CT cervical spine.  Official radiology report(s): Ct Head Wo Contrast  Result Date: 12/03/2018 CLINICAL DATA:  78 year old female status post fall from wheelchair. Forehead hematoma. On Plavix. EXAM: CT HEAD WITHOUT CONTRAST CT CERVICAL SPINE WITHOUT CONTRAST TECHNIQUE: Multidetector CT imaging of the head and cervical spine was performed following the standard protocol without intravenous contrast. Multiplanar CT image reconstructions of the cervical spine were also generated. COMPARISON:  CTA head and neck 06/07/2017 and earlier. FINDINGS: CT HEAD FINDINGS Brain: Encephalomalacia in the right hemisphere with ex vacuo ventricular enlargement. Superimposed Patchy and confluent bilateral cerebral white matter hypodensity. Stable mild left temporal lobe encephalomalacia. Stable gray-white matter differentiation throughout the brain. No midline shift, ventriculomegaly, mass effect, evidence of mass lesion, intracranial hemorrhage or evidence of cortically based acute infarction. Vascular: Calcified atherosclerosis at the skull base. No suspicious intracranial vascular hyperdensity. Skull: Hyperostosis. Previous left side craniotomy. No acute osseous abnormality identified. Sinuses/Orbits: Visualized paranasal sinuses and mastoids are stable and well pneumatized. Other: Broad-based right anterior convexity scalp hematoma measures 10-12 millimeters in thickness. No scalp soft tissue gas. Underlying right frontal bone intact. Stable scalp soft tissues elsewhere. Visualized orbit soft tissues are within normal limits. CT CERVICAL SPINE FINDINGS Alignment: Stable since 2018. Mild degenerative appearing anterolisthesis at C2-C3 and C7-T1. Bilateral posterior element  alignment is within normal limits. Skull base and vertebrae: Visualized skull base is intact. No atlanto-occipital dissociation. No acute osseous abnormality identified. Soft tissues and spinal canal: No prevertebral fluid or swelling. No visible canal hematoma. Calcified carotid atherosclerosis in the neck greater on the left. Disc levels: Widespread advanced cervical spine degeneration. Multilevel spinal stenosis appears stable. Severe left facet degeneration at C2-C3 has progressed since 2018. Upper chest: Negative lung apices and noncontrast thoracic inlet. Visible upper thoracic levels appear stable. IMPRESSION: 1. Right anterior convexity scalp hematoma without underlying skull fracture. 2. No acute intracranial abnormality. Stable non contrast CT appearance of the brain since 2018.  3. No acute osseous abnormality in the cervical spine. Advanced cervical spine degeneration appears largely stable since 2018. Electronically Signed   By: Odessa Fleming M.D.   On: 12/03/2018 23:42   Ct Cervical Spine Wo Contrast  Result Date: 12/03/2018 CLINICAL DATA:  79 year old female status post fall from wheelchair. Forehead hematoma. On Plavix. EXAM: CT HEAD WITHOUT CONTRAST CT CERVICAL SPINE WITHOUT CONTRAST TECHNIQUE: Multidetector CT imaging of the head and cervical spine was performed following the standard protocol without intravenous contrast. Multiplanar CT image reconstructions of the cervical spine were also generated. COMPARISON:  CTA head and neck 06/07/2017 and earlier. FINDINGS: CT HEAD FINDINGS Brain: Encephalomalacia in the right hemisphere with ex vacuo ventricular enlargement. Superimposed Patchy and confluent bilateral cerebral white matter hypodensity. Stable mild left temporal lobe encephalomalacia. Stable gray-white matter differentiation throughout the brain. No midline shift, ventriculomegaly, mass effect, evidence of mass lesion, intracranial hemorrhage or evidence of cortically based acute infarction.  Vascular: Calcified atherosclerosis at the skull base. No suspicious intracranial vascular hyperdensity. Skull: Hyperostosis. Previous left side craniotomy. No acute osseous abnormality identified. Sinuses/Orbits: Visualized paranasal sinuses and mastoids are stable and well pneumatized. Other: Broad-based right anterior convexity scalp hematoma measures 10-12 millimeters in thickness. No scalp soft tissue gas. Underlying right frontal bone intact. Stable scalp soft tissues elsewhere. Visualized orbit soft tissues are within normal limits. CT CERVICAL SPINE FINDINGS Alignment: Stable since 2018. Mild degenerative appearing anterolisthesis at C2-C3 and C7-T1. Bilateral posterior element alignment is within normal limits. Skull base and vertebrae: Visualized skull base is intact. No atlanto-occipital dissociation. No acute osseous abnormality identified. Soft tissues and spinal canal: No prevertebral fluid or swelling. No visible canal hematoma. Calcified carotid atherosclerosis in the neck greater on the left. Disc levels: Widespread advanced cervical spine degeneration. Multilevel spinal stenosis appears stable. Severe left facet degeneration at C2-C3 has progressed since 2018. Upper chest: Negative lung apices and noncontrast thoracic inlet. Visible upper thoracic levels appear stable. IMPRESSION: 1. Right anterior convexity scalp hematoma without underlying skull fracture. 2. No acute intracranial abnormality. Stable non contrast CT appearance of the brain since 2018. 3. No acute osseous abnormality in the cervical spine. Advanced cervical spine degeneration appears largely stable since 2018. Electronically Signed   By: Odessa Fleming M.D.   On: 12/03/2018 23:42    ____________________________________________   PROCEDURES   Procedure(s) performed (including Critical Care):  Procedures   ____________________________________________   INITIAL IMPRESSION / MDM / ASSESSMENT AND PLAN / ED COURSE  As part of  my medical decision making, I reviewed the following data within the electronic MEDICAL RECORD NUMBER Nursing notes reviewed and incorporated, Labs reviewed , EKG interpreted , Old EKG reviewed, Old chart reviewed and Notes from prior ED visits         Differential diagnosis includes, but is not limited to, cardiogenic syncope secondary to bradycardia, orthostatic hypotension, potentially dangerous cardiac arrhythmia such as ventricular tachycardia or ventricular fibrillation, acute infection, intracranial bleeding from her fall, cervical spine fracture.  The patient is well-appearing and in no distress, very talkative and interactive, asking for Percocet but otherwise in no distress.  She does not have any evidence of any injuries other than the contusion/hematoma to the right side of her forehead.  I looked in the medical record and red clinic notes from cardiology (Dr. Sherryl Manges, electrophysiology) from October 2018 that indicate that at least that far back she has had bradycardia with heart rates between the 20s and 40s which is consistent with what we are  seeing tonight.  It was noted that she had a recent CVA at that time and is on Plavix and aspirin.  There was no intervention recommended at that time and she generally speaking she is a low fall risk given that she is nonambulatory.  This seems to have been a mechanical accident while she was turning her wheelchair around to go back inside.  Given her dual antiplatelet therapy and dementia and therefore lack of ability to provide an accurate history, CT head and cervical spine are pending as well as basic labs to look for any significant metabolic abnormality or evidence of infection, but her vital signs are reassuring (other than the chronic bradycardia) and I anticipate discharge back to her facility if her work-up is otherwise reassuring.  I will provide 1 Percocet given the obvious head injury.  Clinical Course as of Dec 04 10  Tue Dec 03, 2018  2356 Lab work was reassuring and consistent from prior with a creatinine of 1.57 but her last creatinine on record from a couple of years ago was 1.5.  There is no evidence of any acute abnormality and her troponin is negative, hemoglobin is stable at 11.   [CF]  Wed Dec 04, 2018  0005 I called and spoke with a representative at [facility] who verified that the information we have in the demographics is correct; the patient has [facility] as a legal guardian with multiple names listed as contacts.  I attempted to call to touch base with them but they were unavailable as it is midnight and they are [employer].  I did not leave a message for fear of violating HIPAA regulations.  The patient is well-appearing and in no distress and the profound bradycardia is a chronic condition as per the medical record and as I previously documented.  She will be discharged back to her facility.  There is no indication to check a urinalysis at this time.   [CF]    Clinical Course User Index [CF] Loleta Rose, MD    ____________________________________________  FINAL CLINICAL IMPRESSION(S) / ED DIAGNOSES  Final diagnoses:  Fall, initial encounter  Contusion of forehead, initial encounter  Traumatic hematoma of forehead, initial encounter     MEDICATIONS GIVEN DURING THIS VISIT:  Medications  oxyCODONE-acetaminophen (PERCOCET/ROXICET) 5-325 MG per tablet 1 tablet (1 tablet Oral Given 12/03/18 2359)     ED Discharge Orders    None       Note:  This document was prepared using Dragon voice recognition software and may include unintentional dictation errors.   Loleta Rose, MD 12/04/18 [phone]

## 2018-12-04 NOTE — ED Notes (Signed)
EMS is here to transport pt back to [facility].

## 2018-12-04 NOTE — Discharge Instructions (Signed)
Joanna Stone was evaluated in the emergency department after fall.  Fortunately, although she has a large bruise and hematoma to her forehead, she has no evidence of bleeding inside of her head and no broken bones.  Her lab work was consistent with prior lab work, and although her heart rate is low, I checked the medical record and this has been the case only since 2018.  There is no indication she needs to stay in the hospital at this time.  Please continue with her regular medications including her prescribed Tylenol and follow-up with her regular doctor when possible.

## 2018-12-10 ENCOUNTER — Telehealth: Payer: Self-pay | Admitting: Student

## 2018-12-10 NOTE — Telephone Encounter (Signed)
Palliative NP left message for Director Leanne Chang to follow up on patient. Awaiting return call.

## 2019-03-04 ENCOUNTER — Encounter: Payer: Self-pay | Admitting: Emergency Medicine

## 2019-03-04 ENCOUNTER — Emergency Department
Admission: EM | Admit: 2019-03-04 | Discharge: 2019-03-04 | Disposition: A | Discharge status: Nursing Home (Includes ICF) | Payer: Medicare Other | Attending: Emergency Medicine | Admitting: Emergency Medicine

## 2019-03-04 ENCOUNTER — Other Ambulatory Visit: Payer: Self-pay

## 2019-03-04 ENCOUNTER — Emergency Department: Payer: Medicare Other

## 2019-03-04 DIAGNOSIS — Z7982 Long term (current) use of aspirin: Secondary | ICD-10-CM | POA: Insufficient documentation

## 2019-03-04 DIAGNOSIS — I129 Hypertensive chronic kidney disease with stage 1 through stage 4 chronic kidney disease, or unspecified chronic kidney disease: Secondary | ICD-10-CM | POA: Diagnosis not present

## 2019-03-04 DIAGNOSIS — F17210 Nicotine dependence, cigarettes, uncomplicated: Secondary | ICD-10-CM | POA: Insufficient documentation

## 2019-03-04 DIAGNOSIS — N183 Chronic kidney disease, stage 3 (moderate): Secondary | ICD-10-CM | POA: Insufficient documentation

## 2019-03-04 DIAGNOSIS — Z79899 Other long term (current) drug therapy: Secondary | ICD-10-CM | POA: Diagnosis not present

## 2019-03-04 DIAGNOSIS — R2242 Localized swelling, mass and lump, left lower limb: Secondary | ICD-10-CM | POA: Diagnosis present

## 2019-03-04 DIAGNOSIS — L03116 Cellulitis of left lower limb: Secondary | ICD-10-CM | POA: Insufficient documentation

## 2019-03-04 DIAGNOSIS — Z8673 Personal history of transient ischemic attack (TIA), and cerebral infarction without residual deficits: Secondary | ICD-10-CM | POA: Diagnosis not present

## 2019-03-04 LAB — COMPREHENSIVE METABOLIC PANEL
ALT: 21 U/L (ref 0–44)
AST: 23 U/L (ref 15–41)
Albumin: 2.6 g/dL — ABNORMAL LOW (ref 3.5–5.0)
Alkaline Phosphatase: 57 U/L (ref 38–126)
Anion gap: 8 (ref 5–15)
BUN: 41 mg/dL — ABNORMAL HIGH (ref 8–23)
CO2: 22 mmol/L (ref 22–32)
Calcium: 8.6 mg/dL — ABNORMAL LOW (ref 8.9–10.3)
Chloride: 110 mmol/L (ref 98–111)
Creatinine, Ser: 1.41 mg/dL — ABNORMAL HIGH (ref 0.44–1.00)
GFR calc Af Amer: 41 mL/min — ABNORMAL LOW (ref >60)
GFR calc non Af Amer: 36 mL/min — ABNORMAL LOW (ref >60)
Glucose, Bld: 110 mg/dL — ABNORMAL HIGH (ref 70–99)
Potassium: 4.8 mmol/L (ref 3.5–5.1)
Sodium: 140 mmol/L (ref 135–145)
Total Bilirubin: 0.6 mg/dL (ref 0.3–1.2)
Total Protein: 6.1 g/dL — ABNORMAL LOW (ref 6.5–8.1)

## 2019-03-04 LAB — CBC WITH DIFFERENTIAL/PLATELET
Abs Immature Granulocytes: 0.09 10*3/uL — ABNORMAL HIGH (ref 0.00–0.07)
Basophils Absolute: 0 10*3/uL (ref 0.0–0.1)
Basophils Relative: 1 %
Eosinophils Absolute: 0.2 10*3/uL (ref 0.0–0.5)
Eosinophils Relative: 2 %
HCT: 31.6 % — ABNORMAL LOW (ref 36.0–46.0)
Hemoglobin: 10 g/dL — ABNORMAL LOW (ref 12.0–15.0)
Immature Granulocytes: 1 %
Lymphocytes Relative: 12 %
Lymphs Abs: 1 10*3/uL (ref 0.7–4.0)
MCH: 31.7 pg (ref 26.0–34.0)
MCHC: 31.6 g/dL (ref 30.0–36.0)
MCV: 100.3 fL — ABNORMAL HIGH (ref 80.0–100.0)
Monocytes Absolute: 0.5 10*3/uL (ref 0.1–1.0)
Monocytes Relative: 6 %
Neutro Abs: 6.5 10*3/uL (ref 1.7–7.7)
Neutrophils Relative %: 78 %
Platelets: 272 10*3/uL (ref 150–400)
RBC: 3.15 MIL/uL — ABNORMAL LOW (ref 3.87–5.11)
RDW: 14.1 % (ref 11.5–15.5)
WBC: 8.4 10*3/uL (ref 4.0–10.5)
nRBC: 0 % (ref 0.0–0.2)

## 2019-03-04 LAB — LACTIC ACID, PLASMA: Lactic Acid, Venous: 1.1 mmol/L (ref 0.5–1.9)

## 2019-03-04 MED ORDER — CEFAZOLIN SODIUM-DEXTROSE 1-4 GM/50ML-% IV SOLN
1.0000 g | Freq: Once | INTRAVENOUS | Status: AC
  Administered 2019-03-04: 1 g via INTRAVENOUS
  Filled 2019-03-04: qty 50

## 2019-03-04 MED ORDER — CEPHALEXIN 500 MG PO CAPS: 500.0000 mg | ORAL_CAPSULE | Freq: Two times a day (BID) | ORAL | 0 refills | Status: DC

## 2019-03-04 NOTE — ED Notes (Signed)
Legal guardian called at this time and made aware of pt status

## 2019-03-04 NOTE — ED Triage Notes (Signed)
Pt via EMS from [facility] facility with c/o LLE redness and swelling. + pedal pulses. Hx of dementia. VSS

## 2019-03-04 NOTE — ED Notes (Signed)
Pt HR in the 30s, MD Kinner aware, EKG completed

## 2019-03-04 NOTE — ED Notes (Signed)
Patient transported to Ultrasound 

## 2019-03-04 NOTE — ED Provider Notes (Addendum)
[facility] Emergency Department Provider Note   ____________________________________________    I have reviewed the triage vital signs and the nursing notes.   HISTORY  Chief Complaint Left leg swelling   HPI Joanna Stone is a 78 y.o. female who presents with complaints of left leg swelling redness and pain.  Patient reports is been ongoing for approximately 1 week.  She states initially was itchy and she was scratching it, now it burns and is quite sore to the touch.  She is wheelchair-bound.  No reports of fevers or chills.  No cough or myalgias  Past Medical History:  Diagnosis Date  . Arthritis   . Bradycardia    seen in 2018 by Dr. Graciela HusbandsKlein (cardiology/electrophysiology), no intervention recommended  . CKD (chronic kidney disease), stage III (HCC)   . 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 History of brain surgery 2001  . History of depression   . Hypertension   . Sinus node dysfunction w/ competing jxnl rhythm    a. 07/2012 Ech: EF >55%, no rwma, mild MR/TR/AI, mildly dil LA.  . Tobacco abuse    a. 05/2017 currently smokes 2 cigarettes/day.  . Vascular dementia [facility]HCC)     Patient Active Problem List   Diagnosis Date Noted  . Goals of care, counseling/discussion   . Palliative care encounter   . TIA (transient ischemic attack)   . Atrial tachycardia (HCC)   . Bradycardia, sinus, persistent, severe 06/03/2017  . Essential hypertension 07/03/2013  . sinus node dysfunction]   . Cerebrovascular accident [facility](HCC)     Past Surgical History:  Procedure Laterality Date  . ABDOMINAL HYSTERECTOMY    . APPENDECTOMY    . TONSILLECTOMY      Prior to Admission medications   Medication Sig Start Date End Date Taking? Authorizing Provider  acetaminophen (MAPAP) 500 MG tablet Take 1,000 mg by mouth every 12 (twelve) hours. May take an additional every 6 hours if needed.   Yes [provider]  aspirin 81 MG tablet  Take 81 mg by mouth daily.   Yes [provider]  atorvastatin (LIPITOR) 40 MG tablet Take 1 tablet (40 mg total) by mouth daily at 6 PM. Patient taking differently: Take 40 mg by mouth every evening.  06/08/17  Yes Shaune Pollackhen, Qing, MD  Calcium Carbonate-Vitamin D (CALCIUM 600+D) 600-400 MG-UNIT per tablet Take 1 tablet by mouth daily.    Yes [provider]  citalopram (CELEXA) 40 MG tablet Take 40 mg by mouth daily.   Yes [provider]  cloNIDine (CATAPRES - DOSED IN MG/24 HR) 0.1 mg/24hr patch Place 0.1 mg onto the skin once a week.   Yes [provider]  clopidogrel (PLAVIX) 75 MG tablet Take 1 tablet (75 mg total) by mouth daily. 06/08/17  Yes Shaune Pollackhen, Qing, MD  enalapril (VASOTEC) 10 MG tablet Take 10 mg by mouth daily.   Yes [provider]  ferrous sulfate 325 (65 FE) MG tablet Take 325 mg by mouth 2 (two) times daily with a meal.   Yes [provider]  fexofenadine (ALLEGRA) 180 MG tablet Take 180 mg by mouth daily.   Yes [provider]  gabapentin (NEURONTIN) 300 MG capsule Take 600 mg by mouth 3 (three) times daily.    Yes [provider]  hydrALAZINE (APRESOLINE) 50 MG tablet Take 50 mg by mouth 3 (three) times daily.   Yes [provider]  nystatin cream (MYCOSTATIN)  Apply 1 application topically 2 (two) times daily. Until redness is healed   Yes [provider]  pantoprazole (PROTONIX) 20 MG tablet Take 20 mg by mouth daily.   Yes [provider]  polyethylene glycol (MIRALAX / GLYCOLAX) packet Take 17 g by mouth daily.   Yes [provider]  cephALEXin (KEFLEX) 500 MG capsule Take 1 capsule (500 mg total) by mouth 2 (two) times daily. 03/04/19   Lavonia Drafts, MD     Allergies Ultram [tramadol]  Family History  Family history unknown: Yes    Social History Social History   Tobacco Use  . Smoking status: Current Every Day Smoker    Packs/day: 1.00    Years: 0.00    Pack  years: 0.00    Types: Cigarettes  . Smokeless tobacco: Never Used  . Tobacco comment: currently smoking 2 cigarettes/day  Substance Use Topics  . Alcohol use: No  . Drug use: No    Review of Systems  Constitutional: No fever/chills Eyes: No visual changes.  ENT: No sore throat. Cardiovascular: Denies chest pain. Respiratory: Denies shortness of breath. Gastrointestinal: No abdominal pain.  No nausea, no vomiting.   Genitourinary: Negative for dysuria. Musculoskeletal: As above Skin: As above Neurological: Negative for headaches or weakness   ____________________________________________   PHYSICAL EXAM:  VITAL SIGNS: ED Triage Vitals  Enc Vitals Group     BP 03/04/19 1023 (!) 158/57     Pulse Rate 03/04/19 1016 (!) 53     Resp 03/04/19 1016 16     Temp 03/04/19 1016 97.9 F (36.6 C)     Temp Source 03/04/19 1016 Oral     SpO2 03/04/19 1016 99 %     Weight --      Height --      Head Circumference --      Peak Flow --      Pain Score --      Pain Loc --      Pain Edu? --      Excl. in [place]? --     Constitutional: Alert and oriented. No acute distress. Pleasant and interactive  Nose: No congestion/rhinnorhea. Mouth/Throat: Mucous membranes are moist.    Cardiovascular: Normal rate, regular rhythm. Grossly normal heart sounds.  Good peripheral circulation. Respiratory: Normal respiratory effort.  No retractions. Lungs CTAB. Gastrointestinal: Soft and nontender. No distention.  No CVA tenderness.  Musculoskeletal: Left leg: Warm and well perfused, 2+ DP pulse, mild swelling, erythema extending from the forefoot to almost the knee, multiple excoriations, tenderness to palpation Neurologic:  Normal speech and language. No gross focal neurologic deficits are appreciated.  Skin:  Skin is warm, dry and intact. No rash noted. Psychiatric: Mood and affect are normal. Speech and behavior are normal.  ____________________________________________   LABS (all labs  ordered are listed, but only abnormal results are displayed)  Labs Reviewed  CBC WITH DIFFERENTIAL/PLATELET - Abnormal; Notable for the following components:      Result Value   RBC 3.15 (*)    Hemoglobin 10.0 (*)    HCT 31.6 (*)    MCV 100.3 (*)    Abs Immature Granulocytes 0.09 (*)    All other components within normal limits  COMPREHENSIVE METABOLIC PANEL - Abnormal; Notable for the following components:   Glucose, Bld 110 (*)    BUN 41 (*)    Creatinine, Ser 1.41 (*)    Calcium 8.6 (*)    Total Protein 6.1 (*)    Albumin 2.6 (*)  GFR calc non Af Amer 36 (*)    GFR calc Af Amer 41 (*)    All other components within normal limits  LACTIC ACID, PLASMA   ____________________________________________  EKG  ED ECG REPORT I, Jene Everyobert Ainhoa Rallo, the attending physician, personally viewed and interpreted this ECG.  Date: 03/10/2019  Rhythm: Atrial fibrillation, bradycardia QRS Axis: normal Intervals: Abnormal ST/T Wave abnormalities: Abnormal Narrative Interpretation: no evidence of acute ischemia   Patient's past EKGs reviewed, this is not significantly changed from prior,  ____________________________________________  RADIOLOGY  Ultrasound negative for DVT ____________________________________________   PROCEDURES  Procedure(s) performed: No  Procedures   Critical Care performed: No ____________________________________________   INITIAL IMPRESSION / ASSESSMENT AND PLAN / ED COURSE  Pertinent labs & imaging results that were available during my care of the patient were reviewed by me and considered in my medical decision making (see chart for details).  Patient presents with what appears to be a cellulitis of the left leg, lab work is quite reassuring, she is afebrile and overall well-appearing.  She is quite adamant that she will not stay in the hospital, given a dose of IV antibiotics here in the emergency department.  Discussed with her legal guardian who is  comfortable with discharge and will ensure close follow-up and compliance with antibiotics    ____________________________________________   FINAL CLINICAL IMPRESSION(S) / ED DIAGNOSES  Final diagnoses:  Cellulitis of left lower extremity        Note:  This document was prepared using Dragon voice recognition software and may include unintentional dictation errors.   Jene EveryKinner, Syra Sirmons, MD 03/04/19 1601    Jene EveryKinner, Eknoor Novack, MD 03/10/19 [phone]

## 2019-04-21 ENCOUNTER — Telehealth: Payer: Self-pay | Admitting: Student

## 2019-04-21 NOTE — Telephone Encounter (Signed)
Palliative NP called to check on patient; spoke with cargiver Glenda T. She denies any needs at this time. NP offered visit, she declines. She is encouraged to call with needs.

## 2019-08-01 ENCOUNTER — Other Ambulatory Visit: Payer: Self-pay

## 2019-08-01 ENCOUNTER — Non-Acute Institutional Stay: Payer: Medicare Other | Admitting: Adult Health Nurse Practitioner

## 2019-08-01 DIAGNOSIS — F0150 Vascular dementia without behavioral disturbance: Secondary | ICD-10-CM

## 2019-08-01 DIAGNOSIS — Z515 Encounter for palliative care: Secondary | ICD-10-CM

## 2019-08-01 NOTE — Progress Notes (Signed)
Therapist, nutritional Palliative Care Consult Note Telephone: [phone]  Fax: [phone]  PATIENT NAME: Joanna Stone DOB: [birth year] MRN: [id number]  PRIMARY CARE PROVIDER:   Patient, No Pcp Per  REFERRING PROVIDER:  No referring provider defined for this encounter.  RESPONSIBLE PARTY:   [name], legal guardian [phone] or [phone]      RECOMMENDATIONS and PLAN:  1.  Advanced care planning.  Patient considered full code until otherwise indicated.  Patient's chart was not available to review at facility today. Attempted to contact legal guardian listed above but another case worker's name was on the outgoing VM.  Left message with contact info to confirm patient's current legal guardian.    2.  Dementia.  FAST 6.  Patient is wheelchair bound.  Requires one person assist with ADLs.  Is able to feed self.  Has good appetite with no reported weight loss.  Is able to have a conversation but will forget what was just discussed.  Denies pain, SOB, cough, N/V/D, constipation, fever.  Continue supportive care.  Staff reported no concerns today.  No falls, infections, or hospitalizations recently.  Last was seen in ER on 03/04/2019 for cellulitis of left leg.  Palliative care will continue to monitor for symptom management and decline and make recommendations as needed.  I spent 25 minutes providing this consultation,  from 10:25 to 10:50. More than 50% of the time in this consultation was spent coordinating communication.   HISTORY OF PRESENT ILLNESS:  Joanna Stone is a 78 y.o. year old female with multiple medical problems including vascular dementia, left hemiparesis post CVA, CKD stage 3, HTN. Palliative Care was asked to help address goals of care.   CODE STATUS: see above  PPS: 50% HOSPICE ELIGIBILITY/DIAGNOSIS: TBD  PHYSICAL EXAM:   General: NAD, frail appearing, thin Cardiovascular: regular rate and rhythm Pulmonary: lung sounds clear;  normal respiratory effort Abdomen: soft, nontender, + bowel sounds GU: no suprapubic tenderness Extremities: no edema, no joint deformities Skin: no rashes Neurological: Wheelchair bound; A&O to person and place  PAST MEDICAL HISTORY:  Past Medical History:  Diagnosis Date  . Arthritis   . Bradycardia    seen in 2018 by Dr. Graciela Husbands (cardiology/electrophysiology), no intervention recommended  . CKD (chronic kidney disease), stage III (HCC)   . 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 History of brain surgery 2001  . History of depression   . Hypertension   . Sinus node dysfunction w/ competing jxnl rhythm    a. 07/2012 Ech: EF >55%, no rwma, mild MR/TR/AI, mildly dil LA.  . Tobacco abuse    a. 05/2017 currently smokes 2 cigarettes/day.  . Vascular dementia (HCC)     SOCIAL HX:  Social History   Tobacco Use  . Smoking status: Current Every Day Smoker    Packs/day: 1.00    Years: 0.00    Pack years: 0.00    Types: Cigarettes  . Smokeless tobacco: Never Used  . Tobacco comment: currently smoking 2 cigarettes/day  Substance Use Topics  . Alcohol use: No    ALLERGIES:  Allergies  Allergen Reactions  . Ultram [Tramadol]     vomiting     PERTINENT MEDICATIONS:  Outpatient Encounter Medications as of 08/01/2019  Medication Sig  . acetaminophen (MAPAP) 500 MG tablet Take 1,000 mg by mouth every 12 (twelve) hours. May take an additional every 6 hours if needed.  Marland Kitchen aspirin 81 MG tablet Take 81  mg by mouth daily.  Marland Kitchen atorvastatin (LIPITOR) 40 MG tablet Take 1 tablet (40 mg total) by mouth daily at 6 PM. (Patient taking differently: Take 40 mg by mouth every evening. )  . Calcium Carbonate-Vitamin D (CALCIUM 600+D) 600-400 MG-UNIT per tablet Take 1 tablet by mouth daily.   . cephALEXin (KEFLEX) 500 MG capsule Take 1 capsule (500 mg total) by mouth 2 (two) times daily.  . citalopram (CELEXA) 40 MG tablet Take 40 mg by mouth daily.  . cloNIDine (CATAPRES - DOSED  IN MG/24 HR) 0.1 mg/24hr patch Place 0.1 mg onto the skin once a week.  . clopidogrel (PLAVIX) 75 MG tablet Take 1 tablet (75 mg total) by mouth daily.  . enalapril (VASOTEC) 10 MG tablet Take 10 mg by mouth daily.  . ferrous sulfate 325 (65 FE) MG tablet Take 325 mg by mouth 2 (two) times daily with a meal.  . fexofenadine (ALLEGRA) 180 MG tablet Take 180 mg by mouth daily.  Marland Kitchen gabapentin (NEURONTIN) 300 MG capsule Take 600 mg by mouth 3 (three) times daily.   . hydrALAZINE (APRESOLINE) 50 MG tablet Take 50 mg by mouth 3 (three) times daily.  Marland Kitchen nystatin cream (MYCOSTATIN) Apply 1 application topically 2 (two) times daily. Until redness is healed  . pantoprazole (PROTONIX) 20 MG tablet Take 20 mg by mouth daily.  . polyethylene glycol (MIRALAX / GLYCOLAX) packet Take 17 g by mouth daily.   No facility-administered encounter medications on file as of 08/01/2019.        Jenetta Downer, NP

## 2019-11-12 ENCOUNTER — Other Ambulatory Visit: Payer: Self-pay

## 2019-11-12 ENCOUNTER — Non-Acute Institutional Stay: Payer: Medicare Other | Admitting: Adult Health Nurse Practitioner

## 2019-11-12 DIAGNOSIS — F0150 Vascular dementia without behavioral disturbance: Secondary | ICD-10-CM

## 2019-11-12 DIAGNOSIS — Z515 Encounter for palliative care: Secondary | ICD-10-CM

## 2019-11-12 NOTE — Progress Notes (Signed)
Therapist, nutritional Palliative Care Consult Note Telephone: [phone]  Fax: [phone]  PATIENT NAME: Joanna Stone DOB: [birth year] MRN: [id number]  PRIMARY CARE PROVIDER:   [facility] REFERRING PROVIDER:  No referring provider defined for this encounter.  RESPONSIBLE PARTY:   Joanna Stone, legal guardian [phone] or [phone]    RECOMMENDATIONS and PLAN:  1.  Advanced care planning.  Patient is full code.  Left VM with legal guardian, Joanna Stone, with reason for call to update her on visit with patient.  Left call back info and encouraged to call with any questions or concerns.  2.  Dementia.  FAST 6.  Patient is wheelchair bound and requires one person assist with ADLs.  She feeds herself and has a good appetite.  No reports of weight changes.  Is able to follow conversation but forgets quickly what just talked about.  Denies SOB, cough, pain, headache, N/V/D, constipation, fever.  Continue supportive care at the facility.  No reports of falls, infection, or hospitalization since last visit.  Continue current plan of care and palliative will continue to monitor for symptom management/decline and will make recommendations as needed.  I spent 25 minutes providing this consultation,  from 9:00 to 9:25. More than 50% of the time in this consultation was spent coordinating communication.   HISTORY OF PRESENT ILLNESS:  Joanna Stone is a 79 y.o. year old female with multiple medical problems including vascular dementia, left hemiparesis post CVA, CKD stage 3, HTN. Palliative Care was asked to help address goals of care.   CODE STATUS: full code  PPS: 50% HOSPICE ELIGIBILITY/DIAGNOSIS: TBD  PHYSICAL EXAM:   General: NAD, frail appearing, thin Cardiovascular: regular rate and rhythm Pulmonary: lung sounds clear; normal respiratory effort Abdomen: soft, nontender, + bowel sounds GU: no suprapubic tenderness Extremities: no edema, no joint  deformities Skin: no rashes Neurological: Wheelchair bound; A&O to person and place  PAST MEDICAL HISTORY:  Past Medical History:  Diagnosis Date  . Arthritis   . Bradycardia    seen in 2018 by Dr. Graciela Husbands (cardiology/electrophysiology), no intervention recommended  . CKD (chronic kidney disease), stage III (HCC)   . 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 History of brain surgery 2001  . History of depression   . Hypertension   . Sinus node dysfunction w/ competing jxnl rhythm    a. 07/2012 Ech: EF >55%, no rwma, mild MR/TR/AI, mildly dil LA.  . Tobacco abuse    a. 05/2017 currently smokes 2 cigarettes/day.  . Vascular dementia (HCC)     SOCIAL HX:  Social History   Tobacco Use  . Smoking status: Current Every Day Smoker    Packs/day: 1.00    Years: 0.00    Pack years: 0.00    Types: Cigarettes  . Smokeless tobacco: Never Used  . Tobacco comment: currently smoking 2 cigarettes/day  Substance Use Topics  . Alcohol use: No    ALLERGIES:  Allergies  Allergen Reactions  . Ultram [Tramadol]     vomiting     PERTINENT MEDICATIONS:  Outpatient Encounter Medications as of 11/12/2019  Medication Sig  . acetaminophen (MAPAP) 500 MG tablet Take 1,000 mg by mouth every 12 (twelve) hours. May take an additional every 6 hours if needed.  Marland Kitchen aspirin 81 MG tablet Take 81 mg by mouth daily.  Marland Kitchen atorvastatin (LIPITOR) 40 MG tablet Take 1 tablet (40 mg total) by mouth daily at 6 PM. (Patient taking differently:  Take 40 mg by mouth every evening. )  . Calcium Carbonate-Vitamin D (CALCIUM 600+D) 600-400 MG-UNIT per tablet Take 1 tablet by mouth daily.   . cephALEXin (KEFLEX) 500 MG capsule Take 1 capsule (500 mg total) by mouth 2 (two) times daily.  . citalopram (CELEXA) 40 MG tablet Take 40 mg by mouth daily.  . cloNIDine (CATAPRES - DOSED IN MG/24 HR) 0.1 mg/24hr patch Place 0.1 mg onto the skin once a week.  . clopidogrel (PLAVIX) 75 MG tablet Take 1 tablet (75 mg  total) by mouth daily.  . enalapril (VASOTEC) 10 MG tablet Take 10 mg by mouth daily.  . ferrous sulfate 325 (65 FE) MG tablet Take 325 mg by mouth 2 (two) times daily with a meal.  . fexofenadine (ALLEGRA) 180 MG tablet Take 180 mg by mouth daily.  Marland Kitchen gabapentin (NEURONTIN) 300 MG capsule Take 600 mg by mouth 3 (three) times daily.   . hydrALAZINE (APRESOLINE) 50 MG tablet Take 50 mg by mouth 3 (three) times daily.  Marland Kitchen nystatin cream (MYCOSTATIN) Apply 1 application topically 2 (two) times daily. Until redness is healed  . pantoprazole (PROTONIX) 20 MG tablet Take 20 mg by mouth daily.  . polyethylene glycol (MIRALAX / GLYCOLAX) packet Take 17 g by mouth daily.   No facility-administered encounter medications on file as of 11/12/2019.     Benjaman Artman Jenetta Downer, NP

## 2019-11-25 ENCOUNTER — Emergency Department: Payer: Medicare Other

## 2019-11-25 ENCOUNTER — Emergency Department
Admission: EM | Admit: 2019-11-25 | Discharge: 2019-11-25 | Disposition: A | Discharge status: Skilled Nursing Facility | Payer: Medicare Other | Attending: Emergency Medicine | Admitting: Emergency Medicine

## 2019-11-25 ENCOUNTER — Other Ambulatory Visit: Payer: Self-pay

## 2019-11-25 ENCOUNTER — Encounter: Payer: Self-pay | Admitting: Emergency Medicine

## 2019-11-25 DIAGNOSIS — W06XXXA Fall from bed, initial encounter: Secondary | ICD-10-CM | POA: Insufficient documentation

## 2019-11-25 DIAGNOSIS — Y999 Unspecified external cause status: Secondary | ICD-10-CM | POA: Insufficient documentation

## 2019-11-25 DIAGNOSIS — F0150 Vascular dementia without behavioral disturbance: Secondary | ICD-10-CM | POA: Insufficient documentation

## 2019-11-25 DIAGNOSIS — S0990XA Unspecified injury of head, initial encounter: Secondary | ICD-10-CM

## 2019-11-25 DIAGNOSIS — F17210 Nicotine dependence, cigarettes, uncomplicated: Secondary | ICD-10-CM | POA: Diagnosis not present

## 2019-11-25 DIAGNOSIS — I129 Hypertensive chronic kidney disease with stage 1 through stage 4 chronic kidney disease, or unspecified chronic kidney disease: Secondary | ICD-10-CM | POA: Diagnosis not present

## 2019-11-25 DIAGNOSIS — W19XXXA Unspecified fall, initial encounter: Secondary | ICD-10-CM

## 2019-11-25 DIAGNOSIS — N183 Chronic kidney disease, stage 3 unspecified: Secondary | ICD-10-CM | POA: Diagnosis not present

## 2019-11-25 DIAGNOSIS — S0001XA Abrasion of scalp, initial encounter: Secondary | ICD-10-CM | POA: Insufficient documentation

## 2019-11-25 DIAGNOSIS — Y9389 Activity, other specified: Secondary | ICD-10-CM | POA: Diagnosis not present

## 2019-11-25 DIAGNOSIS — T148XXA Other injury of unspecified body region, initial encounter: Secondary | ICD-10-CM

## 2019-11-25 DIAGNOSIS — Y92122 Bedroom in nursing home as the place of occurrence of the external cause: Secondary | ICD-10-CM | POA: Diagnosis not present

## 2019-11-25 LAB — CBC WITH DIFFERENTIAL/PLATELET
Abs Immature Granulocytes: 0.03 10*3/uL (ref 0.00–0.07)
Basophils Absolute: 0 10*3/uL (ref 0.0–0.1)
Basophils Relative: 1 %
Eosinophils Absolute: 0.3 10*3/uL (ref 0.0–0.5)
Eosinophils Relative: 5 %
HCT: 37.9 % (ref 36.0–46.0)
Hemoglobin: 12.3 g/dL (ref 12.0–15.0)
Immature Granulocytes: 1 %
Lymphocytes Relative: 26 %
Lymphs Abs: 1.7 10*3/uL (ref 0.7–4.0)
MCH: 32.3 pg (ref 26.0–34.0)
MCHC: 32.5 g/dL (ref 30.0–36.0)
MCV: 99.5 fL (ref 80.0–100.0)
Monocytes Absolute: 0.5 10*3/uL (ref 0.1–1.0)
Monocytes Relative: 7 %
Neutro Abs: 4 10*3/uL (ref 1.7–7.7)
Neutrophils Relative %: 60 %
Platelets: 233 10*3/uL (ref 150–400)
RBC: 3.81 MIL/uL — ABNORMAL LOW (ref 3.87–5.11)
RDW: 13.2 % (ref 11.5–15.5)
WBC: 6.6 10*3/uL (ref 4.0–10.5)
nRBC: 0 % (ref 0.0–0.2)

## 2019-11-25 LAB — COMPREHENSIVE METABOLIC PANEL
ALT: 33 U/L (ref 0–44)
AST: 31 U/L (ref 15–41)
Albumin: 3.6 g/dL (ref 3.5–5.0)
Alkaline Phosphatase: 67 U/L (ref 38–126)
Anion gap: 6 (ref 5–15)
BUN: 37 mg/dL — ABNORMAL HIGH (ref 8–23)
CO2: 24 mmol/L (ref 22–32)
Calcium: 9 mg/dL (ref 8.9–10.3)
Chloride: 109 mmol/L (ref 98–111)
Creatinine, Ser: 1.39 mg/dL — ABNORMAL HIGH (ref 0.44–1.00)
GFR calc Af Amer: 42 mL/min — ABNORMAL LOW (ref >60)
GFR calc non Af Amer: 36 mL/min — ABNORMAL LOW (ref >60)
Glucose, Bld: 91 mg/dL (ref 70–99)
Potassium: 4.8 mmol/L (ref 3.5–5.1)
Sodium: 139 mmol/L (ref 135–145)
Total Bilirubin: 0.6 mg/dL (ref 0.3–1.2)
Total Protein: 6.6 g/dL (ref 6.5–8.1)

## 2019-11-25 MED ORDER — ENALAPRIL MALEATE 10 MG PO TABS
10.0000 mg | ORAL_TABLET | Freq: Once | ORAL | Status: AC
  Administered 2019-11-25: 11:00:00 10 mg via ORAL
  Filled 2019-11-25: qty 1

## 2019-11-25 MED ORDER — HYDRALAZINE HCL 50 MG PO TABS
50.0000 mg | ORAL_TABLET | Freq: Once | ORAL | Status: AC
  Administered 2019-11-25: 50 mg via ORAL
  Filled 2019-11-25: qty 1

## 2019-11-25 NOTE — ED Provider Notes (Signed)
[facility] Emergency Department Provider Note       Time seen: ----------------------------------------- 7:18 AM on 11/25/2019 -----------------------------------------   I have reviewed the triage vital signs and the nursing notes. Level V caveat: History/ROS limited by altered mental status HISTORY   Chief Complaint Fall    HPI Joanna Stone is a 79 y.o. female with a history of arthritis, bradycardia, CKD, CVA, brain surgery, depression, hypertension and vascular dementia who presents to the ED for a fall.  Patient arrives from [place] [facility] via EMS.  EMS reports staff was rounding on her this morning and it look like she fell out of bed.  She was noted to have a laceration to the right side of her head.  Past Medical History:  Diagnosis Date  . Arthritis   . Bradycardia    seen in 2018 by Dr. Graciela Husbands (cardiology/electrophysiology), no intervention recommended  . CKD (chronic kidney disease), stage III (HCC)   . 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 History of brain surgery 2001  . History of depression   . Hypertension   . Sinus node dysfunction w/ competing jxnl rhythm    a. 07/2012 Ech: EF >55%, no rwma, mild MR/TR/AI, mildly dil LA.  . Tobacco abuse    a. 05/2017 currently smokes 2 cigarettes/day.  . Vascular dementia [facility])     Patient Active Problem List   Diagnosis Date Noted  . Goals of care, counseling/discussion   . Palliative care encounter   . TIA (transient ischemic attack)   . Atrial tachycardia (HCC)   . Bradycardia, sinus, persistent, severe 06/03/2017  . Essential hypertension 07/03/2013  . sinus node dysfunction]   . Cerebrovascular accident [facility])     Past Surgical History:  Procedure Laterality Date  . ABDOMINAL HYSTERECTOMY    . APPENDECTOMY    . TONSILLECTOMY      Allergies Ultram [tramadol]  Social History Social History   Tobacco Use  . Smoking status: Current Every Day Smoker   Packs/day: 1.00    Years: 0.00    Pack years: 0.00    Types: Cigarettes  . Smokeless tobacco: Never Used  . Tobacco comment: currently smoking 2 cigarettes/day  Substance Use Topics  . Alcohol use: No  . Drug use: No    Review of Systems Review of systems otherwise unknown Skin: Positive for scalp laceration   All systems negative/normal/unremarkable except as stated in the HPI  ____________________________________________   PHYSICAL EXAM:  VITAL SIGNS: ED Triage Vitals  Enc Vitals Group     BP      Pulse      Resp      Temp      Temp src      SpO2      Weight      Height      Head Circumference      Peak Flow      Pain Score      Pain Loc      Pain Edu?      Excl. in GC?     Constitutional: Alert but disoriented, mildly anxious Eyes: Conjunctivae are normal. Normal extraocular movements. ENT      Head: Normocephalic, right-sided temporoparietal scalp hematoma with superficial laceration and abrasion      Nose: No congestion/rhinnorhea.      Mouth/Throat: Mucous membranes are moist.      Neck: No stridor. Cardiovascular: Normal rate, regular rhythm. No murmurs, rubs, or gallops. Respiratory:  Normal respiratory effort without tachypnea nor retractions. Breath sounds are clear and equal bilaterally. No wheezes/rales/rhonchi. Gastrointestinal: Soft and nontender. Normal bowel sounds Rectal: Dark stool, early sacral decubitus ulcer  Musculoskeletal: Limited but unremarkable range of motion of the extremities Neurologic:   No gross focal neurologic deficits are appreciated.  Skin: Superficial laceration and abrasion on the right scalp as noted above, early decubitus ulcer Psychiatric: Mood and affect are normal. Speech and behavior are normal.  ____________________________________________  ED COURSE:  As part of my medical decision making, I reviewed the following data within the [place] History obtained from family if available, nursing  notes, old chart and ekg, as well as notes from prior ED visits. Patient presented for a fall, we will assess with labs and imaging as indicated at this time.   Procedures  Joanna Stone was evaluated in Emergency Department on 11/25/2019 for the symptoms described in the history of present illness. She was evaluated in the context of the global COVID-19 pandemic, which necessitated consideration that the patient might be at risk for infection with the SARS-CoV-2 virus that causes COVID-19. Institutional protocols and algorithms that pertain to the evaluation of patients at risk for COVID-19 are in a state of rapid change based on information released by regulatory bodies including the CDC and federal and state organizations. These policies and algorithms were followed during the patient's care in the ED.  ____________________________________________   LABS (pertinent positives/negatives)  Labs Reviewed  CBC WITH DIFFERENTIAL/PLATELET - Abnormal; Notable for the following components:      Result Value   RBC 3.81 (*)    All other components within normal limits  COMPREHENSIVE METABOLIC PANEL - Abnormal; Notable for the following components:   BUN 37 (*)    Creatinine, Ser 1.39 (*)    GFR calc non Af Amer 36 (*)    GFR calc Af Amer 42 (*)    All other components within normal limits  URINALYSIS, COMPLETE (UACMP) WITH MICROSCOPIC    RADIOLOGY Images were viewed by me  CT head, chest x-ray, pelvis x-ray IMPRESSION:  Numerous old bilateral basal ganglia lacunar infarcts.   Extensive chronic small vessel disease, diffuse cerebral atrophy.   No acute intracranial abnormality.  IMPRESSION:  Negative.  IMPRESSION:  No evidence of acute cardiopulmonary disease.  ____________________________________________   DIFFERENTIAL DIAGNOSIS   Fall, contusion, fracture, subdural, dehydration, electrolyte abnormality, occult infection  FINAL ASSESSMENT AND PLAN  Fall, minor head injury,  abrasion   Plan: The patient had presented for a fall. Patient's labs were at her baseline. Patient's imaging did not reveal any acute process.  She is cleared for outpatient follow-up.  I topically applied some Dermabond to an abrasion on her scalp that was bleeding which has resolved the issue.   Laurence Aly, MD    Note: This note was generated in part or whole with voice recognition software. Voice recognition is usually quite accurate but there are transcription errors that can and very often do occur. I apologize for any typographical errors that were not detected and corrected.     Earleen Newport, MD 11/25/19 [id number]

## 2019-11-25 NOTE — ED Notes (Signed)
This RN contacted Joanna Stone both office and cell phone number listed on demographics.  VM is full and it cannot take new messages. This RN will attempt to al Joanna.

## 2019-11-25 NOTE — ED Notes (Signed)
This RN contacted [facility],  spoke with [place] med tech who st facility cannot provide transportation at this time.

## 2019-11-25 NOTE — ED Notes (Signed)
[facility]  CALLED  FOR  TRANSPORT  TO  [facility] 

## 2019-11-25 NOTE — ED Notes (Signed)
[place] bed alarm placed. Fall precautions in placed.

## 2019-11-25 NOTE — ED Triage Notes (Addendum)
Pt from [place] [facility] via [facility]. Per EMS unwitnessed fall, [facility] staff reported morning med rounding and after med admnistrattion staff went into room and "it looked like she fell off the bed". Pt presents to ED with a laceration to right side of head. EMS reports pt found "with bright red blood from left buttocks". Pt with hx of dementia. On plavix. EDP Williams at bedside.

## 2019-11-25 NOTE — ED Notes (Signed)
This RN contacted [facility] [phone] and spoke with Idell Pickles Med tech. St "all I was told is that she fell off the bed". Denies AMS or any other complaints.

## 2019-11-25 NOTE — ED Notes (Signed)
Pt to CT

## 2019-11-25 NOTE — ED Notes (Signed)
This Rn attempting to contact Joanna Stone [phone] VM sent in accordance with HIPAA.

## 2019-11-25 NOTE — ED Notes (Signed)
This RN contacted Cyndi Bender ([facility]) [phone] to provided DC status update.

## 2019-11-25 NOTE — ED Notes (Signed)
Pt in room from CT.  

## 2019-12-31 ENCOUNTER — Other Ambulatory Visit: Payer: Self-pay

## 2019-12-31 ENCOUNTER — Non-Acute Institutional Stay: Payer: Medicare Other | Admitting: Adult Health Nurse Practitioner

## 2019-12-31 DIAGNOSIS — Z515 Encounter for palliative care: Secondary | ICD-10-CM

## 2019-12-31 DIAGNOSIS — F0150 Vascular dementia without behavioral disturbance: Secondary | ICD-10-CM

## 2019-12-31 NOTE — Progress Notes (Signed)
Therapist, nutritional Palliative Care Consult Note Telephone: [phone]  Fax: [phone]  PATIENT NAME: Joanna Stone DOB: Mar 08, 1941 MRN: [id number]  PRIMARY CARE PROVIDER:   [facility]  REFERRING PROVIDER:  [facility]  RESPONSIBLE PARTY: [name], legal guardian [phone] or [phone]      RECOMMENDATIONS and PLAN:  1.  Advanced care planning.  Patient is full code.  Tried reaching legal guardian at first number and outgoing message was for another legal guardian.  Left VM requesting a return call in case we needed to update our records with a new guardian.    2.  Dementia. FAST 6.  Patient is wheelchair bound and requires one person assist with ADLs.  She feeds herself and has a good appetite.  No reports of weight changes.  Is able to follow conversation but forgets quickly what just talked about.  Denies SOB, cough, pain, headache, N/V/D, constipation, fever.  Patient has bradycardia but this baseline for her. Patient did have a fall out of bed with laceration to forehead on 11/25/2019.  She was evaluated at ER and work up showed no acute findings.  Continue supportive care at the facility.  Patient is stable.  Palliative will continue to monitor for symptom management/decline and make recommendations as needed.  Will follow up in 6-8 weeks.  I spent 40 minutes providing this consultation,  from 11:10 to 11:40 including time spent with patient/caregiver, chart review, provider coordination, documentation. More than 50% of the time in this consultation was spent coordinating communication.   HISTORY OF PRESENT ILLNESS:  Joanna Stone is a 79 y.o. year old female with multiple medical problems including vascular dementia, left hemiparesis post CVA, CKD stage 3, HTN. Palliative Care was asked to help address goals of care.   CODE STATUS: full code  PPS: 40% HOSPICE ELIGIBILITY/DIAGNOSIS: TBD  PHYSICAL EXAM:  HR 42  O2 99% on RA General: NAD, frail  appearing, thin Cardiovascular: regular rate and rhythm Pulmonary: lung sounds clear; normal respiratory effort Abdomen: soft, nontender, + bowel sounds GU: no suprapubic tenderness Extremities: no edema, no joint deformities Skin: no rashes on exposed skin Neurological: Weakness;  A&O to person and place   PAST MEDICAL HISTORY:  Past Medical History:  Diagnosis Date  . Arthritis   . Bradycardia    seen in 2018 by Dr. Graciela Husbands (cardiology/electrophysiology), no intervention recommended  . CKD (chronic kidney disease), stage III   . 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 History of brain surgery 2001  . History of depression   . Hypertension   . Sinus node dysfunction w/ competing jxnl rhythm    a. 07/2012 Ech: EF >55%, no rwma, mild MR/TR/AI, mildly dil LA.  . Tobacco abuse    a. 05/2017 currently smokes 2 cigarettes/day.  . Vascular dementia (HCC)     SOCIAL HX:  Social History   Tobacco Use  . Smoking status: Current Every Day Smoker    Packs/day: 1.00    Years: 0.00    Pack years: 0.00    Types: Cigarettes  . Smokeless tobacco: Never Used  . Tobacco comment: currently smoking 2 cigarettes/day  Substance Use Topics  . Alcohol use: No    ALLERGIES:  Allergies  Allergen Reactions  . Ultram [Tramadol]     vomiting     PERTINENT MEDICATIONS:  Outpatient Encounter Medications as of 12/31/2019  Medication Sig  . acetaminophen (MAPAP) 500 MG tablet Take 1,000 mg by mouth every  12 (twelve) hours. May take an additional every 6 hours if needed.  Marland Kitchen ammonium lactate (AMLACTIN) 12 % cream Apply topically as needed for dry skin (LEFT FOOT).  Marland Kitchen aspirin 81 MG tablet Take 81 mg by mouth daily.  Marland Kitchen atorvastatin (LIPITOR) 40 MG tablet Take 1 tablet (40 mg total) by mouth daily at 6 PM. (Patient taking differently: Take 40 mg by mouth every evening. )  . Calcium Carbonate-Vitamin D (CALCIUM 600+D) 600-400 MG-UNIT per tablet Take 1 tablet by mouth daily.   .  cephALEXin (KEFLEX) 500 MG capsule Take 1 capsule (500 mg total) by mouth 2 (two) times daily. (Patient not taking: Reported on 11/25/2019)  . Cholecalciferol (VITAMIN D3) 10 MCG (400 UNIT) tablet Take 800 Units by mouth daily.  . cloNIDine (CATAPRES - DOSED IN MG/24 HR) 0.1 mg/24hr patch Place 0.1 mg onto the skin once a week.  . clopidogrel (PLAVIX) 75 MG tablet Take 1 tablet (75 mg total) by mouth daily.  . enalapril (VASOTEC) 10 MG tablet Take 10 mg by mouth daily.  . ferrous sulfate 325 (65 FE) MG tablet Take 325 mg by mouth 2 (two) times daily with a meal.  . fexofenadine (ALLEGRA) 180 MG tablet Take 180 mg by mouth daily.  Marland Kitchen gabapentin (NEURONTIN) 300 MG capsule Take 600 mg by mouth 3 (three) times daily.   . hydrALAZINE (APRESOLINE) 50 MG tablet Take 50 mg by mouth 3 (three) times daily.  Marland Kitchen nystatin cream (MYCOSTATIN) Apply 1 application topically 2 (two) times daily. Until redness is healed  . pantoprazole (PROTONIX) 20 MG tablet Take 20 mg by mouth daily.  . polyethylene glycol (MIRALAX / GLYCOLAX) packet Take 17 g by mouth daily.   No facility-administered encounter medications on file as of 12/31/2019.     Kevyn Wengert Jenetta Downer, NP

## 2020-02-11 ENCOUNTER — Non-Acute Institutional Stay: Payer: Medicare Other | Admitting: Adult Health Nurse Practitioner

## 2020-02-11 ENCOUNTER — Other Ambulatory Visit: Payer: Self-pay

## 2020-02-11 DIAGNOSIS — Z515 Encounter for palliative care: Secondary | ICD-10-CM

## 2020-02-11 DIAGNOSIS — F0150 Vascular dementia without behavioral disturbance: Secondary | ICD-10-CM

## 2020-02-11 NOTE — Progress Notes (Signed)
Therapist, nutritional Palliative Care Consult Note Telephone: [phone]  Fax: [phone]  PATIENT NAME: Joanna Stone DOB: 1941/01/20 MRN: [id number]  PRIMARY CARE PROVIDER:   [facility]  REFERRING PROVIDER:  [facility]  RESPONSIBLE PARTY:   [name], legal guardian [phone] or [phone]      RECOMMENDATIONS and PLAN:  1.  Advanced care planning. Patient is full code.  Left VM with legal guardian and contact info for return call.  2.  Dementia. FAST 6.  Patient is wheelchair bound and requires one person assist with ADLs. She feeds herself and has a good appetite. No reports of weight changes. Is able to follow conversation but forgets quickly what just talked about. Denies SOB, cough, pain, headache, N/V/D, constipation, fever.  Patient has bradycardia but this baseline for her.  No reported falls, infections, or hospital visits since last visit.  Continue supportive care at the facility.  Patient is stable.  Palliative will continue to monitor for symptom management/decline and make recommendations as needed.  Will follow up in 6-8 weeks.  I spent 30 minutes providing this consultation,  From 10:30 to 11:00 including time spent with patient/caregiver, chart review, provider coordination, documentation. More than 50% of the time in this consultation was spent coordinating communication.   HISTORY OF PRESENT ILLNESS:  Joanna Stone is a 79 y.o. year old female with multiple medical problems including vascular dementia, left hemiparesis post CVA, CKD stage 3, HTN. Palliative Care was asked to help address goals of care.   CODE STATUS: full code  PPS: 40% HOSPICE ELIGIBILITY/DIAGNOSIS: TBD  PHYSICAL EXAM:  HR 45  O2 98% on RA General: NAD, frail appearing, thin Cardiovascular: regular rate and rhythm Pulmonary: lung sounds clear; normal respiratory effort Abdomen: soft, nontender, + bowel sounds GU: no suprapubic tenderness Extremities: no edema,  no joint deformities Skin: no rashes on exposed skin Neurological: Weakness;  A&O to person and place  PAST MEDICAL HISTORY:  Past Medical History:  Diagnosis Date   Arthritis    Bradycardia    seen in 2018 by Dr. 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 07/2012 Ech: EF >55%, no rwma, mild MR/TR/AI, mildly dil LA.   Tobacco abuse    a. 05/2017 currently smokes 2 cigarettes/day.   Vascular dementia (HCC)     SOCIAL HX:  Social History   Tobacco Use   Smoking status: Current Every Day Smoker    Packs/day: 1.00    Years: 0.00    Pack years: 0.00    Types: Cigarettes   Smokeless tobacco: Never Used   Tobacco comment: currently smoking 2 cigarettes/day  Substance Use Topics   Alcohol use: No    ALLERGIES:  Allergies  Allergen Reactions   Ultram [Tramadol]     vomiting     PERTINENT MEDICATIONS:  Outpatient Encounter Medications as of 02/11/2020  Medication Sig   acetaminophen (MAPAP) 500 MG tablet Take 1,000 mg by mouth every 12 (twelve) hours. May take an additional every 6 hours if needed.   ammonium lactate (AMLACTIN) 12 % cream Apply topically as needed for dry skin (LEFT FOOT).   aspirin 81 MG tablet Take 81 mg by mouth daily.   atorvastatin (LIPITOR)  40 MG tablet Take 1 tablet (40 mg total) by mouth daily at 6 PM. (Patient taking differently: Take 40 mg by mouth every evening. )   Calcium Carbonate-Vitamin D (CALCIUM 600+D) 600-400 MG-UNIT per tablet Take 1 tablet by mouth daily.    cephALEXin (KEFLEX) 500 MG capsule Take 1 capsule (500 mg total) by mouth 2 (two) times daily. (Patient not taking: Reported on 11/25/2019)   Cholecalciferol (VITAMIN D3) 10 MCG (400 UNIT) tablet Take 800 Units by  mouth daily.   cloNIDine (CATAPRES - DOSED IN MG/24 HR) 0.1 mg/24hr patch Place 0.1 mg onto the skin once a week.   clopidogrel (PLAVIX) 75 MG tablet Take 1 tablet (75 mg total) by mouth daily.   enalapril (VASOTEC) 10 MG tablet Take 10 mg by mouth daily.   ferrous sulfate 325 (65 FE) MG tablet Take 325 mg by mouth 2 (two) times daily with a meal.   fexofenadine (ALLEGRA) 180 MG tablet Take 180 mg by mouth daily.   gabapentin (NEURONTIN) 300 MG capsule Take 600 mg by mouth 3 (three) times daily.    hydrALAZINE (APRESOLINE) 50 MG tablet Take 50 mg by mouth 3 (three) times daily.   nystatin cream (MYCOSTATIN) Apply 1 application topically 2 (two) times daily. Until redness is healed   pantoprazole (PROTONIX) 20 MG tablet Take 20 mg by mouth daily.   polyethylene glycol (MIRALAX / GLYCOLAX) packet Take 17 g by mouth daily.   No facility-administered encounter medications on file as of 02/11/2020.      Mosi Hannold Jenetta Downer, NP

## 2020-04-16 ENCOUNTER — Other Ambulatory Visit: Payer: Self-pay

## 2020-04-16 ENCOUNTER — Non-Acute Institutional Stay: Payer: Medicare Other | Admitting: Adult Health Nurse Practitioner

## 2020-04-16 DIAGNOSIS — Z515 Encounter for palliative care: Secondary | ICD-10-CM

## 2020-04-16 DIAGNOSIS — F0150 Vascular dementia without behavioral disturbance: Secondary | ICD-10-CM

## 2020-04-16 NOTE — Progress Notes (Signed)
Therapist, nutritional Palliative Care Consult Note Telephone: [phone]  Fax: [phone]  PATIENT NAME: Joanna Stone DOB: [birth year] MRN: [id number]  PRIMARY CARE PROVIDER:   [facility]  REFERRING PROVIDER:  [facility]  RESPONSIBLE PARTY:   Forde Radon, legal guardian  [phone] Fax [phone]   RECOMMENDATIONS and PLAN:  1.  Advanced care planning. Patient is full code.Patient has new guardian but she is not in office today.  Was able to get new contact info and will fax note to her.  2.  Dementia. FAST 6. Patient is wheelchair bound and requires one person assist with ADLs. Is able to help with transfers. She feeds herself and has a good appetite. No reports of weight changes. Is able to follow conversation but forgets quickly what just talked about. A&O to person and place. Denies SOB, cough, pain, headache, N/V/D, constipation, fever.Patient has bradycardia but this baseline for her.  No reported falls, infections, or hospital visits since last visit.  Continue supportive care at the facility.  Patient is stable. Palliative will continue to monitor for symptom management/decline and make recommendations as needed. Will follow up in 6-8 weeks.  I spent 30 minutes providing this consultation,  from 10:15 to 10:45 including time spent with patient/caregiver, chart review, provider coordination, documentation. More than 50% of the time in this consultation was spent coordinating communication.   HISTORY OF PRESENT ILLNESS:  Joanna Stone is a 79 y.o. year old female with multiple medical problems including vascular dementia, left hemiparesis post CVA, CKD stage 3, HTN. Palliative Care was asked to help address goals of care.   CODE STATUS: full code  PPS: 40% HOSPICE ELIGIBILITY/DIAGNOSIS: TBD  PHYSICAL EXAM: BP 124/58 HR 47 O2 99% on RA General: NAD, frail appearing, thin Cardiovascular: regular rate and rhythm Pulmonary:lung sounds  clear; normal respiratory effort Abdomen: soft, nontender, + bowel sounds GU: no suprapubic tenderness Extremities: no edema, no joint deformities Skin: no rasheson exposed skin Neurological: Weakness; A&O to person and place  PAST MEDICAL HISTORY:  Past Medical History:  Diagnosis Date  . Arthritis   . Bradycardia    seen in 2018 by Dr. Graciela Husbands (cardiology/electrophysiology), no intervention recommended  . CKD (chronic kidney disease), stage III   . 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 History of brain surgery 2001  . History of depression   . Hypertension   . Sinus node dysfunction w/ competing jxnl rhythm    a. 07/2012 Ech: EF >55%, no rwma, mild MR/TR/AI, mildly dil LA.  . Tobacco abuse    a. 05/2017 currently smokes 2 cigarettes/day.  . Vascular dementia (HCC)     SOCIAL HX:  Social History   Tobacco Use  . Smoking status: Current Every Day Smoker    Packs/day: 1.00    Years: 0.00    Pack years: 0.00    Types: Cigarettes  . Smokeless tobacco: Never Used  . Tobacco comment: currently smoking 2 cigarettes/day  Substance Use Topics  . Alcohol use: No    ALLERGIES:  Allergies  Allergen Reactions  . Ultram [Tramadol]     vomiting     PERTINENT MEDICATIONS:  Outpatient Encounter Medications as of 04/16/2020  Medication Sig  . acetaminophen (MAPAP) 500 MG tablet Take 1,000 mg by mouth every 12 (twelve) hours. May take an additional every 6 hours if needed.  Marland Kitchen ammonium lactate (AMLACTIN) 12 % cream Apply topically as needed for dry skin (LEFT FOOT).  Marland Kitchen aspirin  81 MG tablet Take 81 mg by mouth daily.  Marland Kitchen atorvastatin (LIPITOR) 40 MG tablet Take 1 tablet (40 mg total) by mouth daily at 6 PM. (Patient taking differently: Take 40 mg by mouth every evening. )  . Calcium Carbonate-Vitamin D (CALCIUM 600+D) 600-400 MG-UNIT per tablet Take 1 tablet by mouth daily.   . cephALEXin (KEFLEX) 500 MG capsule Take 1 capsule (500 mg total) by mouth 2 (two) times  daily. (Patient not taking: Reported on 11/25/2019)  . Cholecalciferol (VITAMIN D3) 10 MCG (400 UNIT) tablet Take 800 Units by mouth daily.  . cloNIDine (CATAPRES - DOSED IN MG/24 HR) 0.1 mg/24hr patch Place 0.1 mg onto the skin once a week.  . clopidogrel (PLAVIX) 75 MG tablet Take 1 tablet (75 mg total) by mouth daily.  . enalapril (VASOTEC) 10 MG tablet Take 10 mg by mouth daily.  . ferrous sulfate 325 (65 FE) MG tablet Take 325 mg by mouth 2 (two) times daily with a meal.  . fexofenadine (ALLEGRA) 180 MG tablet Take 180 mg by mouth daily.  Marland Kitchen gabapentin (NEURONTIN) 300 MG capsule Take 600 mg by mouth 3 (three) times daily.   . hydrALAZINE (APRESOLINE) 50 MG tablet Take 50 mg by mouth 3 (three) times daily.  Marland Kitchen nystatin cream (MYCOSTATIN) Apply 1 application topically 2 (two) times daily. Until redness is healed  . pantoprazole (PROTONIX) 20 MG tablet Take 20 mg by mouth daily.  . polyethylene glycol (MIRALAX / GLYCOLAX) packet Take 17 g by mouth daily.   No facility-administered encounter medications on file as of 04/16/2020.     Violette Morneault Marlena Clipper, NP

## 2020-07-23 ENCOUNTER — Other Ambulatory Visit: Payer: Self-pay

## 2020-07-23 ENCOUNTER — Non-Acute Institutional Stay: Payer: Medicare Other | Admitting: Adult Health Nurse Practitioner

## 2020-07-23 DIAGNOSIS — F0150 Vascular dementia without behavioral disturbance: Secondary | ICD-10-CM

## 2020-07-23 DIAGNOSIS — Z515 Encounter for palliative care: Secondary | ICD-10-CM

## 2020-07-23 NOTE — Progress Notes (Signed)
Therapist, nutritional Palliative Care Consult Note Telephone: [phone]  Fax: [phone]  PATIENT NAME: Joanna Stone DOB: [birth year] MRN: [id number]  PRIMARY CARE PROVIDER:   [facility]  REFERRING PROVIDER:[facility]  RESPONSIBLE PARTY:LaShay Hickman, legal guardian  [phone] Fax [phone]  RECOMMENDATIONS and PLAN: 1.Advanced care planning. Patient is full code.  Attempted to contact legal guardian.  Left VM with reason for call and left contact info  2.  Dementia. FAST 6. Patient is wheelchair bound and requires one person assist with ADLs. Is able to help with transfers. She feeds herself and has a good appetite. No reports of weight changes. Is able to follow conversation but is forgetful.  A&O to person and place. Patient has bradycardia but this baseline for her.No reported falls, infections, or hospital visits since last visit.Continue supportive care at the facility.  Patient is stable. Palliative will continue to monitor for symptom management/decline and make recommendations as needed. Will follow up in 8-10 weeks.   I spent 30 minutes providing this consultation,  from 10:30 to 11:00 including time spent with patient/caregiver, chart review, provider coordination, documentation. More than 50% of the time in this consultation was spent coordinating communication.   HISTORY OF PRESENT ILLNESS:  Joanna Stone is a 79 y.o. year old female with multiple medical problems including vascular dementia, left hemiparesis post CVA, CKD stage 3, HTN. Palliative Care was asked to help address goals of care.  Patient did have a fall a few days ago.  States she fell out of bed.  Does have healing ecchymosis to right cheek.  Denies pain.  Denies SOB, cough, headache, N/V/D, constipation, fever.  CODE STATUS: full code  PPS: 40% HOSPICE ELIGIBILITY/DIAGNOSIS: TBD  PHYSICAL EXAM: HR 40O2 97% on RA General: NAD, frail appearing,  thin Cardiovascular: regular rate and rhythm Pulmonary:lung sounds clear; normal respiratory effort Abdomen: soft, nontender, + bowel sounds GU: no suprapubic tenderness Extremities: no edema, no joint deformities Skin: no rasheson exposed skin; has healing ecchymosis to right cheek post fall out of bed Neurological: Weakness; A&O to person and place  PAST MEDICAL HISTORY:  Past Medical History:  Diagnosis Date  . Arthritis   . Bradycardia    seen in 2018 by Dr. Graciela Husbands (cardiology/electrophysiology), no intervention recommended  . CKD (chronic kidney disease), stage III   . 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 History of brain surgery 2001  . History of depression   . Hypertension   . Sinus node dysfunction w/ competing jxnl rhythm    a. 07/2012 Ech: EF >55%, no rwma, mild MR/TR/AI, mildly dil LA.  . Tobacco abuse    a. 05/2017 currently smokes 2 cigarettes/day.  . Vascular dementia (HCC)     SOCIAL HX:  Social History   Tobacco Use  . Smoking status: Current Every Day Smoker    Packs/day: 1.00    Years: 0.00    Pack years: 0.00    Types: Cigarettes  . Smokeless tobacco: Never Used  . Tobacco comment: currently smoking 2 cigarettes/day  Substance Use Topics  . Alcohol use: No    ALLERGIES:  Allergies  Allergen Reactions  . Ultram [Tramadol]     vomiting     PERTINENT MEDICATIONS:  Outpatient Encounter Medications as of 07/23/2020  Medication Sig  . acetaminophen (MAPAP) 500 MG tablet Take 1,000 mg by mouth every 12 (twelve) hours. May take an additional every 6 hours if needed.  Marland Kitchen ammonium lactate (AMLACTIN) 12 %  cream Apply topically as needed for dry skin (LEFT FOOT).  Marland Kitchen aspirin 81 MG tablet Take 81 mg by mouth daily.  Marland Kitchen atorvastatin (LIPITOR) 40 MG tablet Take 1 tablet (40 mg total) by mouth daily at 6 PM. (Patient taking differently: Take 40 mg by mouth every evening. )  . Calcium Carbonate-Vitamin D (CALCIUM 600+D) 600-400 MG-UNIT  per tablet Take 1 tablet by mouth daily.   . cephALEXin (KEFLEX) 500 MG capsule Take 1 capsule (500 mg total) by mouth 2 (two) times daily. (Patient not taking: Reported on 11/25/2019)  . Cholecalciferol (VITAMIN D3) 10 MCG (400 UNIT) tablet Take 800 Units by mouth daily.  . cloNIDine (CATAPRES - DOSED IN MG/24 HR) 0.1 mg/24hr patch Place 0.1 mg onto the skin once a week.  . clopidogrel (PLAVIX) 75 MG tablet Take 1 tablet (75 mg total) by mouth daily.  . enalapril (VASOTEC) 10 MG tablet Take 10 mg by mouth daily.  . ferrous sulfate 325 (65 FE) MG tablet Take 325 mg by mouth 2 (two) times daily with a meal.  . fexofenadine (ALLEGRA) 180 MG tablet Take 180 mg by mouth daily.  Marland Kitchen gabapentin (NEURONTIN) 300 MG capsule Take 600 mg by mouth 3 (three) times daily.   . hydrALAZINE (APRESOLINE) 50 MG tablet Take 50 mg by mouth 3 (three) times daily.  Marland Kitchen nystatin cream (MYCOSTATIN) Apply 1 application topically 2 (two) times daily. Until redness is healed  . pantoprazole (PROTONIX) 20 MG tablet Take 20 mg by mouth daily.  . polyethylene glycol (MIRALAX / GLYCOLAX) packet Take 17 g by mouth daily.   No facility-administered encounter medications on file as of 07/23/2020.     Jamieka Royle Marlena Clipper, NP

## 2020-10-13 ENCOUNTER — Non-Acute Institutional Stay: Payer: Medicare Other | Admitting: Adult Health Nurse Practitioner

## 2020-10-13 ENCOUNTER — Other Ambulatory Visit: Payer: Self-pay

## 2020-10-13 DIAGNOSIS — F0150 Vascular dementia without behavioral disturbance: Secondary | ICD-10-CM

## 2020-10-13 DIAGNOSIS — Z515 Encounter for palliative care: Secondary | ICD-10-CM

## 2020-10-13 NOTE — Progress Notes (Signed)
**Note De-Identified Joanna Obfuscation** Therapist, nutritional Palliative Care Consult Note Telephone: [phone]  Fax: [phone]  PATIENT NAME: Joanna Stone DOB: [birth year] MRN: [id number]  PRIMARY CARE PROVIDER:  [facility]  REFERRING PROVIDER:[facility]  RESPONSIBLE PARTY:LaShay Hickman, legal guardian [phone] Fax [phone]  Chief complaint: Follow-up palliative visit/dementia  RECOMMENDATIONS and PLAN:  1.  Advanced care planning.  Patient is full code.  Fax note to legal guardian  2.  Dementia.  FAST 6.  Patient propels herself in wheelchair, able to assist with transfers, requires 1 person assist with ADLs.  Feeds her self and has good appetite.  Able to follow conversation but is forgetful.  Continue supportive care at facility.  Patient is stable at this time.  Palliative continue to monitor for symptom management/decline and make recommendations as needed.  We will follow up in 8 to 10 weeks.  I spent 30 minutes providing this consultation. More than 50% of the time in this consultation was spent coordinating communication.   HISTORY OF PRESENT ILLNESS:  Joanna Stone is an 80 y.o. year old female with multiple medical problems including vascular dementia, left hemiparesis post CVA, CKD stage 3, HTN. Palliative Care was asked to help address goals of care.  Reviewed electronic and paper chart and spoke with staff.  Patient has no new concerns today.  Denies pain, N/V/D, constipation, dysuria.  HPI/ROS limited due to dementia.  Staff has no new concerns.  Patient has not had any falls, infection, hospital visits since last visit.  Appetite is good with no reported weight changes.  CODE STATUS: Full code  PPS: 40% HOSPICE ELIGIBILITY/DIAGNOSIS: TBD  PHYSICAL EXAM: BP 128/78 HR 85O2 96 % on RA General: NAD, frail appearing, thin Eyes: Sclera anicteric and noninjected with no discharge noted Cardiovascular: regular rate and rhythm Pulmonary:lung sounds clear; normal  respiratory effort Abdomen: soft, nontender, + bowel sounds GU: no suprapubic tenderness Extremities: no edema, no joint deformities Skin: no rasheson exposed skin Neurological: Weakness; A&O to person and place   PAST MEDICAL HISTORY:  Past Medical History:  Diagnosis Date  . Arthritis   . Bradycardia    seen in 2018 by Dr. Graciela Husbands (cardiology/electrophysiology), no intervention recommended  . CKD (chronic kidney disease), stage III   . 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 History of brain surgery 2001  . History of depression   . Hypertension   . Sinus node dysfunction w/ competing jxnl rhythm    a. 07/2012 Ech: EF >55%, no rwma, mild MR/TR/AI, mildly dil LA.  . Tobacco abuse    a. 05/2017 currently smokes 2 cigarettes/day.  . Vascular dementia (HCC)     SOCIAL HX:  Social History   Tobacco Use  . Smoking status: Current Every Day Smoker    Packs/day: 1.00    Years: 0.00    Pack years: 0.00    Types: Cigarettes  . Smokeless tobacco: Never Used  . Tobacco comment: currently smoking 2 cigarettes/day  Substance Use Topics  . Alcohol use: No    ALLERGIES:  Allergies  Allergen Reactions  . Ultram [Tramadol]     vomiting     PERTINENT MEDICATIONS:  Outpatient Encounter Medications as of 10/13/2020  Medication Sig  . acetaminophen (MAPAP) 500 MG tablet Take 1,000 mg by mouth every 12 (twelve) hours. May take an additional every 6 hours if needed.  Marland Kitchen ammonium lactate (AMLACTIN) 12 % cream Apply topically as needed for dry skin (LEFT FOOT).  Marland Kitchen aspirin 81  MG tablet Take 81 mg by mouth daily.  Marland Kitchen atorvastatin (LIPITOR) 40 MG tablet Take 1 tablet (40 mg total) by mouth daily at 6 PM. (Patient taking differently: Take 40 mg by mouth every evening. )  . Calcium Carbonate-Vitamin D (CALCIUM 600+D) 600-400 MG-UNIT per tablet Take 1 tablet by mouth daily.   . cephALEXin (KEFLEX) 500 MG capsule Take 1 capsule (500 mg total) by mouth 2 (two) times daily.  (Patient not taking: Reported on 11/25/2019)  . Cholecalciferol (VITAMIN D3) 10 MCG (400 UNIT) tablet Take 800 Units by mouth daily.  . cloNIDine (CATAPRES - DOSED IN MG/24 HR) 0.1 mg/24hr patch Place 0.1 mg onto the skin once a week.  . clopidogrel (PLAVIX) 75 MG tablet Take 1 tablet (75 mg total) by mouth daily.  . enalapril (VASOTEC) 10 MG tablet Take 10 mg by mouth daily.  . ferrous sulfate 325 (65 FE) MG tablet Take 325 mg by mouth 2 (two) times daily with a meal.  . fexofenadine (ALLEGRA) 180 MG tablet Take 180 mg by mouth daily.  Marland Kitchen gabapentin (NEURONTIN) 300 MG capsule Take 600 mg by mouth 3 (three) times daily.   . hydrALAZINE (APRESOLINE) 50 MG tablet Take 50 mg by mouth 3 (three) times daily.  Marland Kitchen nystatin cream (MYCOSTATIN) Apply 1 application topically 2 (two) times daily. Until redness is healed  . pantoprazole (PROTONIX) 20 MG tablet Take 20 mg by mouth daily.  . polyethylene glycol (MIRALAX / GLYCOLAX) packet Take 17 g by mouth daily.   No facility-administered encounter medications on file as of 10/13/2020.     Taylon Louison Marlena Clipper, NP

## 2020-12-24 ENCOUNTER — Inpatient Hospital Stay
Admission: EM | Admit: 2020-12-24 | Discharge: 2020-12-30 | DRG: 871 | Disposition: A | Discharge status: Hospice | Payer: Medicare Other | Source: Skilled Nursing Facility | Attending: Internal Medicine | Admitting: Internal Medicine

## 2020-12-24 ENCOUNTER — Inpatient Hospital Stay: Payer: Medicare Other

## 2020-12-24 ENCOUNTER — Emergency Department: Payer: Medicare Other

## 2020-12-24 ENCOUNTER — Encounter: Payer: Self-pay | Admitting: Adult Health Nurse Practitioner

## 2020-12-24 ENCOUNTER — Encounter
Admission: EM | Disposition: A | Discharge status: Hospice | Payer: Self-pay | Source: Skilled Nursing Facility | Attending: Internal Medicine

## 2020-12-24 ENCOUNTER — Non-Acute Institutional Stay: Payer: Medicare Other | Admitting: Adult Health Nurse Practitioner

## 2020-12-24 ENCOUNTER — Other Ambulatory Visit: Payer: Self-pay

## 2020-12-24 VITALS — BP 110/60 | HR 65

## 2020-12-24 DIAGNOSIS — L89151 Pressure ulcer of sacral region, stage 1: Secondary | ICD-10-CM | POA: Diagnosis present

## 2020-12-24 DIAGNOSIS — I472 Ventricular tachycardia: Secondary | ICD-10-CM | POA: Diagnosis not present

## 2020-12-24 DIAGNOSIS — Z7401 Bed confinement status: Secondary | ICD-10-CM

## 2020-12-24 DIAGNOSIS — J690 Pneumonitis due to inhalation of food and vomit: Secondary | ICD-10-CM | POA: Diagnosis present

## 2020-12-24 DIAGNOSIS — I255 Ischemic cardiomyopathy: Secondary | ICD-10-CM | POA: Diagnosis present

## 2020-12-24 DIAGNOSIS — I214 Non-ST elevation (NSTEMI) myocardial infarction: Secondary | ICD-10-CM | POA: Diagnosis not present

## 2020-12-24 DIAGNOSIS — E875 Hyperkalemia: Secondary | ICD-10-CM | POA: Diagnosis present

## 2020-12-24 DIAGNOSIS — R627 Adult failure to thrive: Secondary | ICD-10-CM | POA: Diagnosis present

## 2020-12-24 DIAGNOSIS — I5021 Acute systolic (congestive) heart failure: Secondary | ICD-10-CM | POA: Diagnosis present

## 2020-12-24 DIAGNOSIS — R001 Bradycardia, unspecified: Secondary | ICD-10-CM | POA: Diagnosis present

## 2020-12-24 DIAGNOSIS — Z7982 Long term (current) use of aspirin: Secondary | ICD-10-CM

## 2020-12-24 DIAGNOSIS — R4781 Slurred speech: Secondary | ICD-10-CM | POA: Diagnosis not present

## 2020-12-24 DIAGNOSIS — I69320 Aphasia following cerebral infarction: Secondary | ICD-10-CM

## 2020-12-24 DIAGNOSIS — I2111 ST elevation (STEMI) myocardial infarction involving right coronary artery: Secondary | ICD-10-CM | POA: Diagnosis not present

## 2020-12-24 DIAGNOSIS — I2119 ST elevation (STEMI) myocardial infarction involving other coronary artery of inferior wall: Secondary | ICD-10-CM | POA: Diagnosis present

## 2020-12-24 DIAGNOSIS — R6521 Severe sepsis with septic shock: Secondary | ICD-10-CM | POA: Diagnosis present

## 2020-12-24 DIAGNOSIS — I69354 Hemiplegia and hemiparesis following cerebral infarction affecting left non-dominant side: Secondary | ICD-10-CM | POA: Diagnosis not present

## 2020-12-24 DIAGNOSIS — I420 Dilated cardiomyopathy: Secondary | ICD-10-CM | POA: Diagnosis present

## 2020-12-24 DIAGNOSIS — F0150 Vascular dementia without behavioral disturbance: Secondary | ICD-10-CM

## 2020-12-24 DIAGNOSIS — I083 Combined rheumatic disorders of mitral, aortic and tricuspid valves: Secondary | ICD-10-CM | POA: Diagnosis present

## 2020-12-24 DIAGNOSIS — I130 Hypertensive heart and chronic kidney disease with heart failure and stage 1 through stage 4 chronic kidney disease, or unspecified chronic kidney disease: Secondary | ICD-10-CM | POA: Diagnosis present

## 2020-12-24 DIAGNOSIS — R64 Cachexia: Secondary | ICD-10-CM | POA: Diagnosis present

## 2020-12-24 DIAGNOSIS — Z515 Encounter for palliative care: Secondary | ICD-10-CM | POA: Diagnosis not present

## 2020-12-24 DIAGNOSIS — N390 Urinary tract infection, site not specified: Secondary | ICD-10-CM | POA: Diagnosis present

## 2020-12-24 DIAGNOSIS — K922 Gastrointestinal hemorrhage, unspecified: Secondary | ICD-10-CM | POA: Diagnosis present

## 2020-12-24 DIAGNOSIS — I495 Sick sinus syndrome: Secondary | ICD-10-CM | POA: Diagnosis present

## 2020-12-24 DIAGNOSIS — Z452 Encounter for adjustment and management of vascular access device: Secondary | ICD-10-CM

## 2020-12-24 DIAGNOSIS — K760 Fatty (change of) liver, not elsewhere classified: Secondary | ICD-10-CM | POA: Diagnosis present

## 2020-12-24 DIAGNOSIS — A419 Sepsis, unspecified organism: Secondary | ICD-10-CM | POA: Diagnosis present

## 2020-12-24 DIAGNOSIS — I10 Essential (primary) hypertension: Secondary | ICD-10-CM | POA: Diagnosis not present

## 2020-12-24 DIAGNOSIS — R739 Hyperglycemia, unspecified: Secondary | ICD-10-CM | POA: Diagnosis not present

## 2020-12-24 DIAGNOSIS — R54 Age-related physical debility: Secondary | ICD-10-CM | POA: Diagnosis present

## 2020-12-24 DIAGNOSIS — R7401 Elevation of levels of liver transaminase levels: Secondary | ICD-10-CM | POA: Diagnosis present

## 2020-12-24 DIAGNOSIS — Z681 Body mass index (BMI) 19 or less, adult: Secondary | ICD-10-CM | POA: Diagnosis not present

## 2020-12-24 DIAGNOSIS — R9431 Abnormal electrocardiogram [ECG] [EKG]: Secondary | ICD-10-CM | POA: Diagnosis not present

## 2020-12-24 DIAGNOSIS — D509 Iron deficiency anemia, unspecified: Secondary | ICD-10-CM | POA: Diagnosis present

## 2020-12-24 DIAGNOSIS — Z20822 Contact with and (suspected) exposure to covid-19: Secondary | ICD-10-CM | POA: Diagnosis present

## 2020-12-24 DIAGNOSIS — E872 Acidosis: Secondary | ICD-10-CM | POA: Diagnosis present

## 2020-12-24 DIAGNOSIS — R570 Cardiogenic shock: Secondary | ICD-10-CM | POA: Diagnosis present

## 2020-12-24 DIAGNOSIS — Z66 Do not resuscitate: Secondary | ICD-10-CM | POA: Diagnosis not present

## 2020-12-24 DIAGNOSIS — A4151 Sepsis due to Escherichia coli [E. coli]: Secondary | ICD-10-CM | POA: Diagnosis present

## 2020-12-24 DIAGNOSIS — I469 Cardiac arrest, cause unspecified: Secondary | ICD-10-CM | POA: Diagnosis not present

## 2020-12-24 DIAGNOSIS — Z79899 Other long term (current) drug therapy: Secondary | ICD-10-CM

## 2020-12-24 DIAGNOSIS — J029 Acute pharyngitis, unspecified: Secondary | ICD-10-CM

## 2020-12-24 DIAGNOSIS — E871 Hypo-osmolality and hyponatremia: Secondary | ICD-10-CM | POA: Diagnosis not present

## 2020-12-24 DIAGNOSIS — R4701 Aphasia: Secondary | ICD-10-CM | POA: Diagnosis present

## 2020-12-24 DIAGNOSIS — N179 Acute kidney failure, unspecified: Secondary | ICD-10-CM | POA: Diagnosis present

## 2020-12-24 DIAGNOSIS — Z90710 Acquired absence of both cervix and uterus: Secondary | ICD-10-CM

## 2020-12-24 DIAGNOSIS — Z7189 Other specified counseling: Secondary | ICD-10-CM | POA: Diagnosis not present

## 2020-12-24 DIAGNOSIS — I4891 Unspecified atrial fibrillation: Secondary | ICD-10-CM | POA: Diagnosis present

## 2020-12-24 DIAGNOSIS — N189 Chronic kidney disease, unspecified: Secondary | ICD-10-CM

## 2020-12-24 DIAGNOSIS — F17210 Nicotine dependence, cigarettes, uncomplicated: Secondary | ICD-10-CM | POA: Diagnosis present

## 2020-12-24 DIAGNOSIS — L8990 Pressure ulcer of unspecified site, unspecified stage: Secondary | ICD-10-CM | POA: Insufficient documentation

## 2020-12-24 DIAGNOSIS — N1832 Chronic kidney disease, stage 3b: Secondary | ICD-10-CM | POA: Diagnosis present

## 2020-12-24 DIAGNOSIS — Z7902 Long term (current) use of antithrombotics/antiplatelets: Secondary | ICD-10-CM

## 2020-12-24 LAB — URINALYSIS, COMPLETE (UACMP) WITH MICROSCOPIC
Bilirubin Urine: NEGATIVE
Glucose, UA: NEGATIVE mg/dL
Hgb urine dipstick: NEGATIVE
Ketones, ur: 5 mg/dL — AB
Nitrite: NEGATIVE
Protein, ur: 100 mg/dL — AB
Specific Gravity, Urine: 1.02 (ref 1.005–1.030)
WBC, UA: >50 WBC/hpf — ABNORMAL HIGH (ref 0–5)
pH: 5 (ref 5.0–8.0)

## 2020-12-24 LAB — CBC WITH DIFFERENTIAL/PLATELET
Abs Immature Granulocytes: 0.3 10*3/uL — ABNORMAL HIGH (ref 0.00–0.07)
Basophils Absolute: 0 10*3/uL (ref 0.0–0.1)
Basophils Relative: 0 %
Eosinophils Absolute: 0 10*3/uL (ref 0.0–0.5)
Eosinophils Relative: 0 %
HCT: 35.3 % — ABNORMAL LOW (ref 36.0–46.0)
Hemoglobin: 11.4 g/dL — ABNORMAL LOW (ref 12.0–15.0)
Immature Granulocytes: 1 %
Lymphocytes Relative: 6 %
Lymphs Abs: 1.5 10*3/uL (ref 0.7–4.0)
MCH: 32 pg (ref 26.0–34.0)
MCHC: 32.3 g/dL (ref 30.0–36.0)
MCV: 99.2 fL (ref 80.0–100.0)
Monocytes Absolute: 1.3 10*3/uL — ABNORMAL HIGH (ref 0.1–1.0)
Monocytes Relative: 5 %
Neutro Abs: 22.9 10*3/uL — ABNORMAL HIGH (ref 1.7–7.7)
Neutrophils Relative %: 88 %
Platelets: 227 10*3/uL (ref 150–400)
RBC: 3.56 MIL/uL — ABNORMAL LOW (ref 3.87–5.11)
RDW: 14.2 % (ref 11.5–15.5)
WBC: 26 10*3/uL — ABNORMAL HIGH (ref 4.0–10.5)
nRBC: 0 % (ref 0.0–0.2)

## 2020-12-24 LAB — PROCALCITONIN: Procalcitonin: 3.57 ng/mL

## 2020-12-24 LAB — COMPREHENSIVE METABOLIC PANEL
ALT: 95 U/L — ABNORMAL HIGH (ref 0–44)
AST: 411 U/L — ABNORMAL HIGH (ref 15–41)
Albumin: 3.6 g/dL (ref 3.5–5.0)
Alkaline Phosphatase: 92 U/L (ref 38–126)
Anion gap: 15 (ref 5–15)
BUN: 74 mg/dL — ABNORMAL HIGH (ref 8–23)
CO2: 17 mmol/L — ABNORMAL LOW (ref 22–32)
Calcium: 9 mg/dL (ref 8.9–10.3)
Chloride: 105 mmol/L (ref 98–111)
Creatinine, Ser: 3.91 mg/dL — ABNORMAL HIGH (ref 0.44–1.00)
GFR, Estimated: 11 mL/min — ABNORMAL LOW (ref >60)
Glucose, Bld: 208 mg/dL — ABNORMAL HIGH (ref 70–99)
Potassium: 4.9 mmol/L (ref 3.5–5.1)
Sodium: 137 mmol/L (ref 135–145)
Total Bilirubin: 0.8 mg/dL (ref 0.3–1.2)
Total Protein: 7.2 g/dL (ref 6.5–8.1)

## 2020-12-24 LAB — CORTISOL: Cortisol, Plasma: 85.9 ug/dL

## 2020-12-24 LAB — PROTIME-INR
INR: 1.3 — ABNORMAL HIGH (ref 0.8–1.2)
Prothrombin Time: 16.5 seconds — ABNORMAL HIGH (ref 11.4–15.2)

## 2020-12-24 LAB — GLUCOSE, CAPILLARY: Glucose-Capillary: 225 mg/dL — ABNORMAL HIGH (ref 70–99)

## 2020-12-24 LAB — HEMOGLOBIN AND HEMATOCRIT, BLOOD
HCT: 34.6 % — ABNORMAL LOW (ref 36.0–46.0)
Hemoglobin: 10.9 g/dL — ABNORMAL LOW (ref 12.0–15.0)

## 2020-12-24 LAB — LACTIC ACID, PLASMA
Lactic Acid, Venous: 6.1 mmol/L (ref 0.5–1.9)
Lactic Acid, Venous: 9.9 mmol/L (ref 0.5–1.9)

## 2020-12-24 LAB — RESP PANEL BY RT-PCR (FLU A&B, COVID) ARPGX2
Influenza A by PCR: NEGATIVE
Influenza B by PCR: NEGATIVE
SARS Coronavirus 2 by RT PCR: NEGATIVE

## 2020-12-24 LAB — TSH: TSH: 3.286 u[IU]/mL (ref 0.350–4.500)

## 2020-12-24 LAB — TROPONIN I (HIGH SENSITIVITY)
Troponin I (High Sensitivity): >27000 ng/L (ref <18)
Troponin I (High Sensitivity): >27000 ng/L (ref <18)

## 2020-12-24 LAB — APTT: aPTT: 34 seconds (ref 24–36)

## 2020-12-24 LAB — LIPASE, BLOOD: Lipase: 23 U/L (ref 11–51)

## 2020-12-24 LAB — BRAIN NATRIURETIC PEPTIDE: B Natriuretic Peptide: 3981.5 pg/mL — ABNORMAL HIGH (ref 0.0–100.0)

## 2020-12-24 LAB — MAGNESIUM: Magnesium: 2.4 mg/dL (ref 1.7–2.4)

## 2020-12-24 SURGERY — CORONARY/GRAFT ACUTE MI REVASCULARIZATION
Anesthesia: Moderate Sedation

## 2020-12-24 MED ORDER — HEPARIN (PORCINE) 25000 UT/250ML-% IV SOLN
700.0000 [IU]/h | INTRAVENOUS | Status: DC
  Administered 2020-12-24: 700 [IU]/h via INTRAVENOUS
  Filled 2020-12-24: qty 250

## 2020-12-24 MED ORDER — LACTATED RINGERS IV BOLUS
2000.0000 mL | Freq: Once | INTRAVENOUS | Status: AC
  Administered 2020-12-24: 2000 mL via INTRAVENOUS

## 2020-12-24 MED ORDER — ASPIRIN 81 MG PO CHEW
324.0000 mg | CHEWABLE_TABLET | Freq: Once | ORAL | Status: DC
  Filled 2020-12-24: qty 4

## 2020-12-24 MED ORDER — LACTATED RINGERS IV SOLN: INTRAVENOUS | Status: DC

## 2020-12-24 MED ORDER — PANTOPRAZOLE SODIUM 40 MG IV SOLR
40.0000 mg | Freq: Two times a day (BID) | INTRAVENOUS | Status: DC
  Administered 2020-12-25 – 2020-12-26 (×3): 40 mg via INTRAVENOUS
  Filled 2020-12-24 (×3): qty 40

## 2020-12-24 MED ORDER — PANTOPRAZOLE SODIUM 40 MG IV SOLR
40.0000 mg | INTRAVENOUS | Status: DC
  Administered 2020-12-24: 40 mg via INTRAVENOUS
  Filled 2020-12-24: qty 40

## 2020-12-24 MED ORDER — SODIUM CHLORIDE 0.9 % IV SOLN: 2.0000 g | INTRAVENOUS | Status: DC

## 2020-12-24 MED ORDER — SODIUM CHLORIDE 0.9 % IV SOLN
3.0000 g | Freq: Once | INTRAVENOUS | Status: AC
  Administered 2020-12-24: 3 g via INTRAVENOUS
  Filled 2020-12-24: qty 8

## 2020-12-24 MED ORDER — DOCUSATE SODIUM 100 MG PO CAPS: 100.0000 mg | ORAL_CAPSULE | Freq: Two times a day (BID) | ORAL | Status: DC | PRN

## 2020-12-24 MED ORDER — INSULIN ASPART 100 UNIT/ML ~~LOC~~ SOLN
0.0000 [IU] | SUBCUTANEOUS | Status: DC
  Administered 2020-12-26 – 2020-12-28 (×7): 1 [IU] via SUBCUTANEOUS
  Filled 2020-12-24 (×8): qty 1

## 2020-12-24 MED ORDER — SODIUM CHLORIDE 0.9 % IV SOLN
500.0000 mg | INTRAVENOUS | Status: DC
  Administered 2020-12-24: 500 mg via INTRAVENOUS
  Filled 2020-12-24: qty 500

## 2020-12-24 MED ORDER — CLOPIDOGREL BISULFATE 75 MG PO TABS
75.0000 mg | ORAL_TABLET | Freq: Every day | ORAL | Status: DC
  Administered 2020-12-25 – 2020-12-28 (×4): 75 mg via ORAL
  Filled 2020-12-24 (×4): qty 1

## 2020-12-24 MED ORDER — VANCOMYCIN HCL IN DEXTROSE 1-5 GM/200ML-% IV SOLN: 1000.0000 mg | Freq: Once | INTRAVENOUS | Status: DC

## 2020-12-24 MED ORDER — POLYETHYLENE GLYCOL 3350 17 G PO PACK: 17.0000 g | PACK | Freq: Every day | ORAL | Status: DC | PRN

## 2020-12-24 MED ORDER — VANCOMYCIN VARIABLE DOSE PER UNSTABLE RENAL FUNCTION (PHARMACIST DOSING): Status: DC

## 2020-12-24 MED ORDER — HEPARIN SODIUM (PORCINE) 5000 UNIT/ML IJ SOLN
60.0000 [IU]/kg | Freq: Once | INTRAMUSCULAR | Status: AC
  Administered 2020-12-24: 3350 [IU] via INTRAVENOUS

## 2020-12-24 MED ORDER — SODIUM CHLORIDE 0.9 % IV SOLN: 2.0000 g | Freq: Once | INTRAVENOUS | Status: DC

## 2020-12-24 MED ORDER — VANCOMYCIN HCL 1250 MG/250ML IV SOLN
1250.0000 mg | Freq: Once | INTRAVENOUS | Status: AC
  Administered 2020-12-24: 1250 mg via INTRAVENOUS
  Filled 2020-12-24: qty 250

## 2020-12-24 NOTE — Consult Note (Signed)
Cardiology Consultation:   Patient ID: AMALEA OTTEY MRN: [id number]; DOB: 03/08/1941  Admit date: 12/24/2020 Date of Consult: 12/24/2020  PCP:  Patient, No Pcp Per (Inactive)   [facility]  Cardiologist:  Dossie Arbour, MD PhD Electrophysiologist:  Sherryl Manges, MD  Patient Profile:   Joanna Stone is a 80 y.o. female with a hx of longstanding bradycardia with sinus node dysfunction, multiple strokes with left hemiparesis, and hypertension, who is being seen today for the evaluation of possible STEMI at the request of Dr. Katrinka Blazing.  History of Present Illness:   Joanna Stone resides at a nursing home and is currently able to provide only limited information due to her underlying dementia.  I spoke with the supervisor at her nursing facility, who reports that Joanna Stone had an episode of emesis 2 days ago but otherwise had been feeling well and acting like her usual self.  She did not have any further emesis yesterday and was eating normally.  This morning, she seemed less energetic than usual.  Her speech was also softer and seemed slurred.  This prompted her nursing facility to contact EMS.  EKG was notable for bradycardia with possible inferior ST elevation.  Upon arrival in the emergency department, Joanna Stone is without complaints.  She denies chest pain, shortness of breath, nausea, and lightheadedness.  She is unsure of why she was transported to the emergency department.  She notes chronic weakness in her left side but does not feel like this is any different than usual.   Past Medical History:  Diagnosis Date  . Arthritis   . Bradycardia    seen in 2018 by Dr. Graciela Husbands (cardiology/electrophysiology), no intervention recommended  . CKD (chronic kidney disease), stage III (HCC)   . 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 History of brain surgery 2001  . History of depression   . Hypertension   . Sinus node dysfunction w/ competing  jxnl rhythm    a. 07/2012 Ech: EF >55%, no rwma, mild MR/TR/AI, mildly dil LA.  . Tobacco abuse    a. 05/2017 currently smokes 2 cigarettes/day.  . Vascular dementia [facility])     Past Surgical History:  Procedure Laterality Date  . ABDOMINAL HYSTERECTOMY    . APPENDECTOMY    . TONSILLECTOMY       Home Medications:  Prior to Admission medications   Medication Sig Start Date Tish Begin Date Taking? Authorizing Provider  acetaminophen (MAPAP) 500 MG tablet Take 1,000 mg by mouth every 12 (twelve) hours. May take an additional every 6 hours if needed.    [provider]  ammonium lactate (AMLACTIN) 12 % cream Apply topically as needed for dry skin (LEFT FOOT).    [provider]  aspirin 81 MG tablet Take 81 mg by mouth daily.    [provider]  atorvastatin (LIPITOR) 40 MG tablet Take 1 tablet (40 mg total) by mouth daily at 6 PM. Patient taking differently: Take 40 mg by mouth every evening.  06/08/17   Shaune Pollack, MD  Calcium Carbonate-Vitamin D (CALCIUM 600+D) 600-400 MG-UNIT per tablet Take 1 tablet by mouth daily.     [provider]  cephALEXin (KEFLEX) 500 MG capsule Take 1 capsule (500 mg total) by mouth 2 (two) times daily. Patient not taking: Reported on 11/25/2019 03/04/19   Jene Every, MD  Cholecalciferol (VITAMIN D3) 10 MCG (400 UNIT) tablet Take 800 Units by mouth daily.    [provider]  cloNIDine (CATAPRES - DOSED IN MG/24 HR) 0.1 mg/24hr patch Place 0.1 mg onto the skin once a week.    [provider]  clopidogrel (PLAVIX) 75 MG tablet Take 1 tablet (75 mg total) by mouth daily. 06/08/17   Shaune Pollack, MD  enalapril (VASOTEC) 10 MG tablet Take 10 mg by mouth daily.    [provider]  ferrous sulfate 325 (65 FE) MG tablet Take 325 mg by mouth 2 (two) times daily with a meal.    [provider]  fexofenadine (ALLEGRA) 180 MG tablet Take 180 mg by mouth daily.    [provider]  gabapentin (NEURONTIN)  300 MG capsule Take 600 mg by mouth 3 (three) times daily.     [provider]  hydrALAZINE (APRESOLINE) 50 MG tablet Take 50 mg by mouth 3 (three) times daily.    [provider]  nystatin cream (MYCOSTATIN) Apply 1 application topically 2 (two) times daily. Until redness is healed    [provider]  pantoprazole (PROTONIX) 20 MG tablet Take 20 mg by mouth daily.    [provider]  polyethylene glycol (MIRALAX / GLYCOLAX) packet Take 17 g by mouth daily.    [provider]    Inpatient Medications: Scheduled Meds:  Continuous Infusions:  PRN Meds:   Allergies:    Allergies  Allergen Reactions  . Ultram [Tramadol]     vomiting    Social History:   Unable to obtain due to patient's dementia.  She has a history of longstanding tobacco use.  Family History:   Unable to obtain secondary to patient's dementia.  ROS:  See HPI.  Unable to obtain additional information due to patient's dementia.  Physical Exam/Data:   Vitals:   12/24/20 1503 12/24/20 1504 12/24/20 1511  BP:  104/66   Pulse:  (!) 55   Resp:  13   Temp:  98.8 F (37.1 C) (!) 97.5 F (36.4 C)  TempSrc:  Oral   SpO2:  99%   Weight: 55.7 kg    Height: 5\' 6"  (1.676 m)     No intake or output data in the 24 hours ending 12/24/20 1523 Last 3 Weights 12/24/2020 11/25/2019 12/03/2018  Weight (lbs) 122 lb 12.8 oz 130 lb 125 lb  Weight (kg) 55.702 kg 58.968 kg 56.7 kg     Body mass index is 19.82 kg/m.  General: Thin, elderly woman lying on stretcher. HEENT: normal Lymph: no adenopathy Neck: no JVD Endocrine:  No thryomegaly Vascular: No carotid bruits; FA pulses 2+ bilaterally without bruits  Cardiac: Bradycardic and irregular with 1/6 systolic murmur. Lungs: Diffusely diminished breath sounds with poor inspiratory effort. Abd: soft, nontender, no hepatomegaly  Ext: no edema Musculoskeletal:  No deformities.  4/5 right upper and lower extremity strength.  1/5  left arm and leg strength. Neuro: Alert and oriented x3. Psych: Flat affect.  EKG:  The EKG was personally reviewed and demonstrates: Significant baseline wander with narrow complex bradycardia.  Unable to exclude underlying atrial fibrillation versus sinus arrest with junctional rhythm.  There are nonspecific ST segment changes with some tracings showing up to a millimeter of ST elevation in leads III and aVF as well as posterior changes.  Relevant CV Studies: TTE (06/04/2017): - Left ventricle: The cavity size was normal. There was mild  concentric hypertrophy. Systolic function was normal. The  estimated ejection fraction was in the range of 55% to 60%. Wall  motion was normal; there were no  regional wall motion  abnormalities. The study is not technically sufficient to allow  evaluation of LV diastolic function.  - Aortic valve: There was trivial regurgitation.  - Mitral valve: There was mild regurgitation.  - Left atrium: The atrium was moderately dilated.  - Pulmonary arteries: Systolic pressure was mildly increased. PA  peak pressure: 40 mm Hg (S).   Laboratory Data:  High Sensitivity Troponin:  No results for input(s): TROPONINIHS in the last 720 hours.   ChemistryNo results for input(s): NA, K, CL, CO2, GLUCOSE, BUN, CREATININE, CALCIUM, GFRNONAA, GFRAA, ANIONGAP in the last 168 hours.  No results for input(s): PROT, ALBUMIN, AST, ALT, ALKPHOS, BILITOT in the last 168 hours. HematologyNo results for input(s): WBC, RBC, HGB, HCT, MCV, MCH, MCHC, RDW, PLT in the last 168 hours. BNPNo results for input(s): BNP, PROBNP in the last 168 hours.  DDimer No results for input(s): DDIMER in the last 168 hours.   Radiology/Studies:  No results found.   Assessment and Plan:   Abnormal EKG with concern for STEMI: Presenting symptoms are reported fatigue and slurred speech over the last day as well as a single episode of emesis 2 days ago.  Joanna Stone is currently  asymptomatic.  Her EKG is challenging to interpret due to baseline wander.  There is some inferior ST elevation in leads III and aVF though it is borderline and difficult to definitively say that this represents a STEMI.  I have spoken with Joanna Stone, who states that she would not want to undergo invasive procedures such as cardiac catheterization.  I attempted to reach out to her legal guardian at [facility] social services without success.  Given patient's comorbidities, atypical symptoms, and borderline EKG findings as well as her desire to avoid invasive procedures, we will defer emergent cardiac catheterization at this time.  Serial troponins should be checked.  If there is significant elevation/rise in high-sensitivity troponin I, initiation of heparin should be considered after excluding acute intracranial abnormality such as acute stroke or hemorrhage.  Transthoracic echocardiogram is recommended.  Serial troponins should be trended until they have peaked.  Given lack of symptoms, bradycardia, and borderline low blood pressure, beta-blockers, calcium channel blockers, and nitrates are precluded.  Home DAPT and statin therapy will be continued.  Bradycardia: Longstanding and relatively stable based on review of prior EKGs.  I am not convinced that her lethargy and slurred speech are due to her bradycardia.  Rate lowering agents should be avoided.  Continued telemetry monitoring is also recommended.  Slurred speech and history of strokes: The patient's speech is reasonably well understood on exam today.  She has marked left-sided weakness, though this is not new.  I think CTA of the head should be considered to exclude acute intracranial abnormality, particularly if anticoagulation is necessary.  Current EKGs raised the possibility of atrial fibrillation though this could also represent sinus node dysfunction with junctional escape.  Continue telemetry monitoring is recommended.  For now, DAPT  with aspirin and clopidogrel should also be continued.  Hypertension: Blood pressure borderline low today.  I think it would be reasonable to hold clonidine (which can also exacerbate bradycardia), enalapril, on hydralazine with close monitoring of her blood pressure.  For questions or updates, please contact [facility] Please consult [email or web address] for contact info under [facility].  Signed, Yvonne Kendall, MD  12/24/2020 3:23 PM

## 2020-12-24 NOTE — ED Notes (Signed)
Lab  Notified this nurse of hemolyzed type and screen. This nurse asked Lab to come redraw. Lab stated they would come redraw.

## 2020-12-24 NOTE — ED Notes (Signed)
Lab called again to collect type and screen. Per lab they will come as soon as possible.

## 2020-12-24 NOTE — Consult Note (Addendum)
Pharmacy Antibiotic Note  Joanna Stone is a 80 y.o. female admitted on 12/24/2020 with sepsis. Pt presented with slurred speech and lethargy, last known well time unknown. PMH includes bradycardia, dementia, stroke, seizures, HTN, CKD, and CVA. EKG concerning for STEMI. Pt choked prior to admission, concern for aspiration pneumonia and possible UTI. Pharmacy has been consulted for Unasyn dosing.  Afebrile, WBC 26, Scr 3.91, LA 6.1  Plan: --Vancomycin 1250 mg x 1 loading dose ordered. Will not plan to continue vancomycin after loading dose per NP.  --Ordered Unasyn 3g q12h --Monitor renal function --F/u MRSA PCR and PCT   Height: 5\' 6"  (167.6 cm) Weight: 55.7 kg (122 lb 12.8 oz) IBW/kg (Calculated) : 59.3  Temp (24hrs), Avg:98.2 F (36.8 C), Min:97.5 F (36.4 C), Max:98.8 F (37.1 C)  Recent Labs  Lab 12/24/20 1516 12/24/20 1540  WBC  --  26.0*  CREATININE  --  3.91*  LATICACIDVEN 6.1*  --     Estimated Creatinine Clearance: 10.3 mL/min (A) (by C-G formula based on SCr of 3.91 mg/dL (H)).    Allergies  Allergen Reactions  . Ultram [Tramadol]     vomiting    Antimicrobials this admission: 4/15 cefepime x 1 dose 4/15 vanc x 1 dose 4/15 Unasyn >>  Dose adjustments this admission: N/A  Microbiology results: 4/15 BCx: pending 4/15 UCx: ordered  Thank you for allowing pharmacy to be a part of this patient's care.  5/15, PharmD Pharmacy Resident  12/24/2020 7:23 PM

## 2020-12-24 NOTE — Progress Notes (Signed)
[facility] Consult Note Telephone: [phone]  Fax: [phone]    Date of encounter: 12/24/20 PATIENT NAME: Joanna Stone [id number]   [phone] (home)  DOB: Jul 24, 1941 MRN: [id number] PRIMARY CARE PROVIDER:    Dr. Arliss Journey PROVIDER:   Dr. Durwin Reges   RESPONSIBLE PARTY:    Contact Information    Name Relation Home Work [place] Other  [phone] [place] Other [phone]     [place]   [phone]       I met face to face with patient and family in facility. Palliative Care was asked to follow this patient by consultation request of  Dr. Durwin Reges to address advance care planning and complex medical decision making. This is a follow up visit.  Fax note to legal guardian, Joanna Stone.                                   ASSESSMENT AND PLAN / RECOMMENDATIONS:   Advance Care Planning/Goals of Care: Goals include to maximize quality of life and symptom management.  CODE STATUS: Full code  Symptom Management/Plan:  Sore throat: Patient does have redness and white patches noted in throat.  Reported this to Joanna Stone and she is going to call Joanna Stone to get an emergency visit.  Vascular dementia: Patient appears to be at baseline though does seem to be little more tired than usual and this just could be because of her acute illness.  Continue supportive care at facility  Palliative will continue to monitor for symptom management/decline and make recommendations as needed   Follow up Palliative Care Visit: Palliative care will continue to follow for complex medical decision making, advance care planning, and clarification of goals. Return 8-10 weeks or prn.  I spent 40 minutes providing this consultation. More than 50% of the time in this consultation was spent in counseling and care coordination.  PPS: 40%  HOSPICE  ELIGIBILITY/DIAGNOSIS: TBD  Chief Complaint: Follow-up palliative visit/sore throat  HISTORY OF PRESENT ILLNESS:  Joanna Stone is a 80 y.o. year old female  with vascular dementia, left hemiparesis post CVA, CKD stage 3, HTN.  Patient states that she was throwing up yesterday and not feeling well over the past 2 days.  States that her throat is sore.  Denies cough, fever, shortness of breath, dysuria, hematuria.  Staff endorses that starting yesterday patient was having vomiting and complaining of sore throat.  Vital signs are within normal limits.  Of note her heart rate is 65 when it usually runs in the 40s and 50s.  Otherwise patient seems to be at baseline functionally.  Patient has not had any falls or Stone visits since last visit.  Rest of 10 Stone ROS asked and negative except what is stated in HPI  History obtained from review of EMR, and interview with facility staff and Joanna Stone.   PHYSICAL EXAM:  General: NAD, frail appearing, thin Eyes: Sclera anicteric and noninjected with no discharge noted ENMT: Moist mucous membranes; throat injected with white patches noted to the left side of throat Cardiovascular: regular rate and rhythm Pulmonary:lung sounds clear; normal respiratory effort Abdomen: soft, nontender, + bowel sounds Extremities: no edema, no joint deformities Skin: no rasheson exposed skin Neurological: Weakness; A&O to person and place   Thank you for the  opportunity to participate in the care of Joanna Stone.  The palliative care team will continue to follow. Please call our office at [phone] if we can be of additional assistance.   Joanna Stone Jenetta Downer, NP , DNP  This chart was dictated using voice recognition software. Despite best efforts to proofread, errors can occur which can change the documentation meaning.   COVID-19 PATIENT SCREENING TOOL Asked and negative response unless otherwise noted:   Have you had symptoms of covid, tested positive or  been in contact with someone with symptoms/positive test in the past 5-10 days?  Negative

## 2020-12-24 NOTE — Progress Notes (Signed)
Elink following Code Sepsis. 

## 2020-12-24 NOTE — Progress Notes (Addendum)
[facility] STEMI Evaluation Note  Date: 12/24/20  Time: 11:13 PM  I was contacted by the ICU team regarding repeat EKG demonstrating significant ST segment elevation this evening.  Tracings at 2140 and 2255 have been reviewed and are consistent with inferior STEMI with posterior changes.  Per Ms. Rust-Chester, the patient continues to deny chest pain and shortness of breath.  She is not hypotensive and continues to demonstrate chronic bradycardia.  Labs are consistent with multiorgan dysfunction, including AKI, significant lactic acidosis, marked leukocytosis, and transaminitis.  The ICU team is also concerned about acute GI bleeding, with the patient having a dark stool that was occult blood positive this evening.  This has prompted discontinuation of IV heparin  While the patient is having an inferior MI, I do not believe that she is a candidate for cardiac catheterization and PCI at this time as the risks of the procedure outweigh the benefits.  I suspect that her cardiac event may have started up to 2 days ago with report of nausea/vomiting and presenting HS-TnI earlier today at >27,000.  Additionally, she is at significant risk for worsening renal failure with contrast administration, including becoming dialysis dependent.  Anticoagulation would also be required for PCI, which is contraindicated in the setting of suspected acute GI bleed.  I recommend supportive care.  Heparin and antiplatelet therapy should be resumed if the primary team feels like active bleeding is no longer an issue.  The patient's prognosis remains guarded.  Yvonne Kendall, MD [facility]

## 2020-12-24 NOTE — ED Notes (Signed)
Called Lab to obtain blood cultures.

## 2020-12-24 NOTE — H&P (Signed)
NAME:  Joanna Stone, MRN:  [id number], DOB:  [birth year], LOS: 0 ADMISSION DATE:  12/24/2020, CONSULTATION DATE:  12/24/2020 REFERRING MD:  Dr. Adaline Sill, CHIEF COMPLAINT:   Fatigue & Aphasia  History of Present Illness:  80 year old female with a legal guardian presenting to [facility] ED from a local skilled nursing facility with sepsis due to suspected UTI and aspiration pneumonia complicated by NSTEMI and baseline vascular dementia. ED course: Per ED documentation patient reportedly more confused and lethargic, sleeping later today than usual and with a possible choking episode on 12/23/2020.  The patient was initially presented as a possible STEMI alert and was evaluated by Dr. Okey Dupre with [facility] cardiology. Supervisor at the SNF reported to Dr. Okey Dupre that the patient had an episode of emesis 2 days ago but was otherwise in her normal state of health until the morning of 12/24/2020 at which point she was more lethargic and her speech seemed softer and slurred.  EMS was called at which point EKG was notable for bradycardia with possible inferior ST elevation.  However upon arrival to [facility] ED the patient was asymptomatic denying chest pain, shortness of breath, nausea or lightheadedness.  She notes chronic weakness on her left side from a previous stroke but does not feel this is different than usual.  Patient has also been bradycardic with marginal blood pressures, however she has chronic bradycardia in the setting of sinus node dysfunction. The decision was made not to perform a cardiac cath at this time, and due to a heparin drip was initiated per pharmacy protocol. Significant labs: Troponin elevated > 27,000 x 2, BNP elevated at 3981.5, Transaminitis: AST 411, ALT 95, lactic acidosis 6.1, leukocytosis 26, AKI with BUN/Cr: 74/3.91, serum CO2 17.  UA consistent with UTI: + Leukocytes, + many bacteria, > 50 WBCs.  Chest x-ray consistent with developing RLL pneumonia & mild pulmonary edema versus aspiration.  PCCM  consulted for admission due to patient's complex picture. 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 12/24/20 Admit to SDU for monitoring on heparin drip with N-STEMI & Sepsis  Interim History / Subjective:  Patient lying in ED stretcher, alert and responsive but challenging to understand. States she wants her wheelchair, explained that with how sick she is at the moment she would have to stay in bed. Also complaining of being cold- nursing brought her warm blankets.  Objective   Blood pressure (!) 110/51, pulse (!) 58, temperature (!) 97.5 F (36.4 C), resp. rate (!) 29, height 5\' 6"  (1.676 m), weight 55.7 kg, SpO2 94 %.       No intake or output data in the 24 hours ending 12/24/20 1916 Filed Weights   12/24/20 1503  Weight: 55.7 kg    Examination: General: Adult female, critically ill & frail appearing, lying in bed, NAD HEENT: MM pink/dry, anicteric, atraumatic, neck supple Neuro: alert and responsive, difficult to understand-follows some simple commands, PERRL +3, MAE CV: s1s2 RRR, NSR-SB on monitor, no r/m/g Pulm: Regular, non labored on RA , breath sounds coarse rhonchi throughout GI: soft, flat, non tender, bs x 4 Skin: no rashes/lesions noted Extremities: warm/dry, pulses + 2 R/P, no edema noted  Labs/imaging that I have personally reviewed  (right click and "Reselect all SmartList Selections" daily)  12/24/20 CT head w/o contrast >>  stable atrophy chronic white matter microvascular ischemic changes and remote basal ganglia lacunar type infarcts.  No acute intracranial abnormality.  12/24/20 RUQ US >> pending EKG Interpretation Date: 12/24/20 EKG Time: 15:08 Rate: 55 Rhythm: A-fib vs junctional rhythm (baseline wander of leads make read  challenging) QRS Axis:  possible LAD Intervals: unable to calculate PR ST/T Wave abnormalities: possible STE in lead III and ST depression in V1/ V2 Narrative Interpretation: possible A-fib without clear ischemic features.  Na+/ K+: 137/ 4.9 BUN/Cr.: 74/ 3.91 Serum CO2/ AG: 17/ 15  Hgb: 11.4 Troponin: > 27,000 BNP: 3981.5  WBC/ TMAX: 26/  Lactic/ PCT: 6.1 / pending  CXR 12/24/2020: Increased central vascular congestion with developing right basilar consolidation.  Possibly mild pulmonary edema versus aspiration Resolved Hospital Problem list    Assessment & Plan:  Sepsis without septic shock due to suspected UTI & Aspiration Pneumonia Lactic:6.1 > , Baseline PCT: pending, UA: + leukocytes/ MANY bacteria/ > 50 WBC's, CXR: suspected RLL consolidation  Initial interventions/workup included: 2 L of LR & Unasyn & Vancomycin - Supplemental oxygen as needed, to maintain SpO2 > 90% - f/u cultures, trend lactic/ PCT, f/u Strep Pneumoniae Ur Antigen & Legionella - Daily CBC - monitor WBC/ fever curve - IV antibiotics: unasyn (aspiration PNA) - IVF hydration as needed: LR @ 75 mL/h - Consider vasopressors to maintain MAP< 65 - Strict I/O's: alert provider if UOP < 0.5 mL/kg/hr  N-STEMI Asymptomatic Bradycardia PMHx: Bradycardia with sinus node dysfunction, HTN Initially activated as a STEMI- Dr. Okey DupreEnd consulted with recommendations to treat as N-STEMI and monitor closely as patient remains asymptomatic.  BNP elevated: 3981 - Trend troponins  - Echocardiogram ordered - heparin drip per pharmacy consult - anti platelet: Plavix continued - beta-blocker: none at this time due to bradycardia & marginal BP - statin: atorvastatin on hold d/t transaminitis - continuous cardiac monitoring - Cardiology consulted, appreciate input - consider cardiac CTA/ NM stress test once stabilized - f/u A1c, TSH & thyroid panel, lipid panel  Acute Kidney Injury superimposed on CKD Stage IIIb in the  setting of Sepsis Non-AG Metabolic Acidosis Baseline Cr: 1.39-1.57, Cr on admission:3.91 - Strict I/O's: alert provider if UOP < 0.5 mL/kg/hr - gentle IVF hydration  - Daily BMP, replace electrolytes PRN - Avoid nephrotoxic agents as able, ensure adequate renal perfusion - Consult nephrology if iHD or CRRT indicated  - consider renal US  Transaminitis - trend hepatic function - RUQ US  Vascular Dementia History of CVA with residual L hemiparesis CT head w/o contrast: negative for acute intracranial process. Concern for Aspiration Pna in the setting of described "choking" episode per SNF staff in ED documentation - SLP evaluation ordered - falls precautions - supportive care  Best practice (right click and "Reselect all SmartList Selections" daily)  Diet:  NPO Pain/Anxiety/Delirium protocol (if indicated): No VAP protocol (if indicated): Not indicated DVT prophylaxis: Systemic AC GI prophylaxis: PPI Glucose control:  SSI No Central venous access:  N/A Arterial line:  N/A Foley:  N/A Mobility:  bed rest  PT consulted: N/A Last date of multidisciplinary goals of care discussion 12/24/20 Code Status:  full code Disposition: ICU  Labs   CBC: Recent Labs  Lab 12/24/20 1540  WBC 26.0*  NEUTROABS 22.9*  HGB 11.4*  HCT 35.3*  MCV 99.2  PLT 227    Basic Metabolic Panel: Recent Labs  Lab 12/24/20 1540  NA 137  K 4.9  CL 105  CO2 17*  GLUCOSE 208*  BUN  74*  CREATININE 3.91*  CALCIUM 9.0  MG 2.4   GFR: Estimated Creatinine Clearance: 10.3 mL/min (A) (by C-G formula based on SCr of 3.91 mg/dL (H)). Recent Labs  Lab 12/24/20 1516 12/24/20 1540  WBC  --  26.0*  LATICACIDVEN 6.1*  --     Liver Function Tests: Recent Labs  Lab 12/24/20 1540  AST 411*  ALT 95*  ALKPHOS 92  BILITOT 0.8  PROT 7.2  ALBUMIN 3.6   No results for input(s): LIPASE, AMYLASE in the last 168 hours. No results for input(s): AMMONIA in the last 168 hours.  ABG No results found  for: PHART, PCO2ART, PO2ART, HCO3, TCO2, ACIDBASEDEF, O2SAT   Coagulation Profile: No results for input(s): INR, PROTIME in the last 168 hours.  Cardiac Enzymes: No results for input(s): CKTOTAL, CKMB, CKMBINDEX, TROPONINI in the last 168 hours.  HbA1C: Hgb A1c MFr Bld  Date/Time Value Ref Range Status  06/04/2017 04:52 AM 5.1 4.8 - 5.6 % Final    Comment:    (NOTE) Pre diabetes:          5.7%-6.4% Diabetes:              >6.4% Glycemic control for   <7.0% adults with diabetes     CBG: No results for input(s): GLUCAP in the last 168 hours.  Review of Systems: Positives in BOLD  Patient challenging to understand- states she's cold.  Past Medical History:  She,  has a past medical history of Arthritis, Bradycardia, CKD (chronic kidney disease), stage III (HCC), CVA (cerebral infarction) (1995), History of brain surgery (2001), History of depression, Hypertension, Sinus node dysfunction w/ competing jxnl rhythm, Tobacco abuse, and Vascular dementia (HCC).   Surgical History:   Past Surgical History:  Procedure Laterality Date  . ABDOMINAL HYSTERECTOMY    . APPENDECTOMY    . TONSILLECTOMY       Social History:   reports that she has been smoking cigarettes. She has been smoking about 1.00 pack per day for the past 0.00 years. She has never used smokeless tobacco. She reports that she does not drink alcohol and does not use drugs.   Family History:  Her Family history is unknown by patient.   Allergies Allergies  Allergen Reactions  . Ultram [Tramadol]     vomiting     Home Medications  Prior to Admission medications   Medication Sig Start Date End Date Taking? Authorizing Provider  acetaminophen (TYLENOL) 500 MG tablet Take 1,000 mg by mouth every 12 (twelve) hours. May take an additional every 6 hours if needed.   Yes [provider]  aspirin 81 MG tablet Take 81 mg by mouth daily.   Yes [provider]  atorvastatin (LIPITOR) 40 MG tablet Take 1  tablet (40 mg total) by mouth daily at 6 PM. Patient taking differently: Take 40 mg by mouth every evening. 06/08/17  Yes Shaune Pollack, MD  Calcium Carbonate-Vitamin D 600-400 MG-UNIT tablet Take 1 tablet by mouth daily.    Yes [provider]  Cholecalciferol (VITAMIN D3) 10 MCG (400 UNIT) tablet Take 800 Units by mouth daily.   Yes [provider]  citalopram (CELEXA) 40 MG tablet Take 40 mg by mouth daily. 12/06/20  Yes [provider]  clopidogrel (PLAVIX) 75 MG tablet Take 1 tablet (75 mg total) by mouth daily. 06/08/17  Yes Shaune Pollack, MD  enalapril (VASOTEC) 10 MG tablet Take 10 mg by mouth daily.   Yes [provider]  ferrous  sulfate 325 (65 FE) MG tablet Take 325 mg by mouth 2 (two) times daily with a meal.   Yes [provider]  fexofenadine (ALLEGRA) 180 MG tablet Take 180 mg by mouth daily.   Yes [provider]  gabapentin (NEURONTIN) 300 MG capsule Take 600 mg by mouth 3 (three) times daily.    Yes [provider]  hydrALAZINE (APRESOLINE) 50 MG tablet Take 50 mg by mouth 3 (three) times daily.   Yes [provider]  pantoprazole (PROTONIX) 20 MG tablet Take 20 mg by mouth daily.   Yes [provider]  polyethylene glycol (MIRALAX / GLYCOLAX) packet Take 17 g by mouth daily.   Yes [provider]  ammonium lactate (AMLACTIN) 12 % cream Apply topically as needed for dry skin (LEFT FOOT).    [provider]  cephALEXin (KEFLEX) 500 MG capsule Take 1 capsule (500 mg total) by mouth 2 (two) times daily. Patient not taking: No sig reported 03/04/19   Jene Every, MD  cloNIDine (CATAPRES - DOSED IN MG/24 HR) 0.1 mg/24hr patch Place 0.1 mg onto the skin once a week.    [provider]  nystatin cream (MYCOSTATIN) Apply 1 application topically 2 (two) times daily. Until redness is healed Patient not taking: Reported on 12/24/2020    [provider]     Critical care time: 57  minutes       Betsey Holiday, AGACNP-BC Acute Care Nurse Practitioner [facility]   [phone] / [phone] Please see Amion for pager details.

## 2020-12-24 NOTE — ED Triage Notes (Signed)
Pt BIBA. Pt got choked yesterday, facility reported pt had slurred speech and is more lethargic since yesterday. Pt has no complaints. Unknown LKW. No slurred speech upon arrival. Unsure of pts baseline.  hx of dementia, stroke, seizures, renal dz. Poor historian. HR 32, BP 99/72, 98% RA with EMS. 20G R wrist. CBG 229.

## 2020-12-24 NOTE — ED Notes (Signed)
Black stool noted on brief. Cheryll Cockayne, NP notified. Per NP,  Hemoccult was done. Hemoccult positive. Per NP, Heparin was stopped.

## 2020-12-24 NOTE — Progress Notes (Addendum)
Patient + hemoccult stool per bedside test by ED RN. Patient's had 1 small soft black stool since arrival in ED. Discussed case with Dr. Arlean Hopping, he is in agreement with changes to plan of care.  Lactic Acidosis- worsening 6.1 > 9.9 RUQ shows hepatic steatosis  - STAT CT abdomen pelvis wo contrast - continue gentle IVF, due to concerns for pulmonary edema  Acute Lower Gastrointestinal Bleed in the setting of heparin drip for NSTEMI - STOP heparin drip - STAT H&H - initiate Protonix IVP BID - Maintain up to date type & screen - continuous cardiac monitoring - NPO - Monitor for s/s of bleeding - Daily CBC, PT/ INR monitoring PRN - Transfuse for Hgb <7 - consider GI consult   ST elevation noted on ECG, 12 lead done by nursing shows Inferior STEMI- recent and ST depression in V1, V2. Patient remains asymptomatic. Spoke with Dr. Okey Dupre, in the setting of multi-organ failure and inability to anticoagulate no further intervention at this time.  Spoke with DSS Social Worker on-call for Adults, alerted her to admission. Will call back C-Com line if a consent is needed: [phone] Son updated.   Cheryll Cockayne Rust-Chester, AGACNP-BC Acute Care Nurse Practitioner [facility]   [phone] / [phone] Please see Amion for pager details.

## 2020-12-24 NOTE — Consult Note (Signed)
ANTICOAGULATION CONSULT NOTE  Pharmacy Consult for heparin Indication: chest pain/ACS  Allergies  Allergen Reactions  . Ultram [Tramadol]     vomiting    Patient Measurements: Height: 5\' 6"  (167.6 cm) Weight: 55.7 kg (122 lb 12.8 oz) IBW/kg (Calculated) : 59.3 Heparin Dosing Weight: 55.7  Vital Signs: Temp: 97.5 F (36.4 C) (04/15 1511) Temp Source: Oral (04/15 1504) BP: 110/88 (04/15 1531) Pulse Rate: 34 (04/15 1542)  Labs: Recent Labs    12/24/20 1540  HGB 11.4*  HCT 35.3*  PLT 227  CREATININE 3.91*  TROPONINIHS >27,000*    Estimated Creatinine Clearance: 10.3 mL/min (A) (by C-G formula based on SCr of 3.91 mg/dL (H)).   Medications:  No anticoagulants prior to admission per chart review   Assessment: 80 yo F presented with slurred speech and lethargy, last known well time unknown. PMH includes bradycardia, dementia, stroke, seizures, HTN, CKD, and CVA. EKG concerning for STEMI. Pharmacy consulted to dose heparin for ACS/STEMI.   Baseline labs ordered, Hgb 11.4, plt 227, trops > 27,000  Goal of Therapy:  Heparin level 0.3-0.7 units/ml Monitor platelets by anticoagulation protocol: Yes   Plan:  Heparin 3350 units bolus x 1 dose per EDP Start heparin infusion at 700 units/hr Check anti-Xa level in 8 hours and daily while on heparin Continue to monitor H&H and platelets  76, PharmD Pharmacy Resident  12/24/2020 5:01 PM

## 2020-12-24 NOTE — ED Provider Notes (Signed)
[facility] Emergency Department Provider Note ____________________________________________   Event Date/Time   First MD Initiated Contact with Patient 12/24/20 1512     (approximate)  I have reviewed the triage vital signs and the nursing notes.  HISTORY  Chief Complaint Fatigue and Aphasia   HPI Joanna Stone is a 80 y.o. femalewho presents to the ED for evaluation of altered mentation  Chart review indicates history of stroke on aspirin and Plavix.  HTN, CVA with left-sided deficits.  Vascular dementia.  CKD 3.  Sick sinus syndrome. Reportedly full code.  Patient resides at a local SNF and is bedbound.  EMS reports that the facility indicates that she has not been at her baseline yesterday or today.  Increased confusion and lethargy, possible choking episode yesterday.  Slept in until 1130 today instead of getting up her typical time about 8 or 830.  History limited due to patient's dementia.  She is denying pain, including chest pain, chest pressure, shortness of breath, headache or abdominal pain.  She is requesting socks for her feet.   Past Medical History:  Diagnosis Date  . Arthritis   . Bradycardia    seen in 2018 by Dr. Graciela Husbands (cardiology/electrophysiology), no intervention recommended  . CKD (chronic kidney disease), stage III (HCC)   . 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 History of brain surgery 2001  . History of depression   . Hypertension   . Sinus node dysfunction w/ competing jxnl rhythm    a. 07/2012 Ech: EF >55%, no rwma, mild MR/TR/AI, mildly dil LA.  . Tobacco abuse    a. 05/2017 currently smokes 2 cigarettes/day.  . Vascular dementia [facility])     Patient Active Problem List   Diagnosis Date Noted  . Sepsis (HCC) 12/24/2020  . Goals of care, counseling/discussion   . Palliative care encounter   . TIA (transient ischemic attack)   . Atrial tachycardia (HCC)   . 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 Essential hypertension 07/03/2013  . sinus node dysfunction]   . Cerebrovascular accident [facility])     Past Surgical History:  Procedure Laterality Date  . ABDOMINAL HYSTERECTOMY    . APPENDECTOMY    . TONSILLECTOMY      Prior to Admission medications   Medication Sig Start Date End Date Taking? Authorizing Provider  acetaminophen (TYLENOL) 500 MG tablet Take 1,000 mg by mouth every 12 (twelve) hours. May take an additional every 6 hours if needed.   Yes [provider]  aspirin 81 MG tablet Take 81 mg by mouth daily.   Yes [provider]  atorvastatin (LIPITOR) 40 MG tablet Take 1 tablet (40 mg total) by mouth daily at 6 PM. Patient taking differently: Take 40 mg by mouth every evening. 06/08/17  Yes Shaune Pollack, MD  Calcium Carbonate-Vitamin D 600-400 MG-UNIT tablet Take 1 tablet by mouth daily.    Yes [provider]  Cholecalciferol (VITAMIN D3) 10 MCG (400 UNIT) tablet Take 800 Units by mouth daily.   Yes [provider]  citalopram (CELEXA) 40 MG tablet Take 40 mg by mouth daily. 12/06/20  Yes [provider]  clopidogrel (PLAVIX) 75 MG tablet Take 1 tablet (75 mg total) by mouth daily. 06/08/17  Yes Shaune Pollack, MD  enalapril (VASOTEC) 10 MG tablet Take 10 mg by mouth daily.   Yes [provider]  ferrous sulfate 325 (65 FE) MG tablet Take 325 mg by mouth 2 (  two) times daily with a meal.   Yes [provider]  fexofenadine (ALLEGRA) 180 MG tablet Take 180 mg by mouth daily.   Yes [provider]  gabapentin (NEURONTIN) 300 MG capsule Take 600 mg by mouth 3 (three) times daily.    Yes [provider]  hydrALAZINE (APRESOLINE) 50 MG tablet Take 50 mg by mouth 3 (three) times daily.   Yes [provider]  pantoprazole (PROTONIX) 20 MG tablet Take 20 mg by mouth daily.   Yes [provider]  polyethylene glycol (MIRALAX / GLYCOLAX) packet Take 17 g by mouth daily.   Yes  [provider]  ammonium lactate (AMLACTIN) 12 % cream Apply topically as needed for dry skin (LEFT FOOT).    [provider]  cephALEXin (KEFLEX) 500 MG capsule Take 1 capsule (500 mg total) by mouth 2 (two) times daily. Patient not taking: No sig reported 03/04/19   Jene Every, MD  cloNIDine (CATAPRES - DOSED IN MG/24 HR) 0.1 mg/24hr patch Place 0.1 mg onto the skin once a week.    [provider]  nystatin cream (MYCOSTATIN) Apply 1 application topically 2 (two) times daily. Until redness is healed Patient not taking: Reported on 12/24/2020    [provider]    Allergies Ultram [tramadol]  Family History  Family history unknown: Yes    Social History Social History   Tobacco Use  . Smoking status: Current Every Day Smoker    Packs/day: 1.00    Years: 0.00    Pack years: 0.00    Types: Cigarettes  . Smokeless tobacco: Never Used  . Tobacco comment: currently smoking 2 cigarettes/day  Vaping Use  . Vaping Use: Never used  Substance Use Topics  . Alcohol use: No  . Drug use: No    Review of Systems  Unable to be accurately assessed due to patient's altered mentation and dementia at baseline ____________________________________________   PHYSICAL EXAM:  VITAL SIGNS: Vitals:   12/24/20 1653 12/24/20 1813  BP: (!) 124/100 (!) 110/51  Pulse: 79 (!) 58  Resp: (!) 22 (!) 29  Temp:    SpO2: 94% 94%     Constitutional: Alert and oriented to self, year and location.  Disoriented to month and day of the week..  Chronically ill-appearing. Eyes: Conjunctivae are normal. PERRL. EOMI. Head: Atraumatic. Nose: No congestion/rhinnorhea. Mouth/Throat: Mucous membranes are dry.  Oropharynx non-erythematous. Neck: No stridor. No cervical spine tenderness to palpation. Cardiovascular: Irregular and often bradycardic.. Grossly normal heart sounds.  Good peripheral circulation. Respiratory: Tachypneic to the mid 20s, clear lungs.  No  retractions. Gastrointestinal: Soft , nondistended, nontender to palpation. No CVA tenderness. Musculoskeletal: No lower extremity tenderness nor edema.  No joint effusions. No signs of acute trauma. Neurologic:  Normal speech and language.  Left-sided deficits that patient reports is at her baseline.  Follows simple commands in all 4 extremities. Skin:  Skin is warm, dry and intact. No rash noted. Psychiatric: Mood and affect are normal. Speech and behavior are normal.  ____________________________________________   LABS (all labs ordered are listed, but only abnormal results are displayed)  Labs Reviewed  COMPREHENSIVE METABOLIC PANEL - Abnormal; Notable for the following components:      Result Value   CO2 17 (*)    Glucose, Bld 208 (*)    BUN 74 (*)    Creatinine, Ser 3.91 (*)    AST 411 (*)    ALT 95 (*)    GFR, Estimated 11 (*)  All other components within normal limits  CBC WITH DIFFERENTIAL/PLATELET - Abnormal; Notable for the following components:   WBC 26.0 (*)    RBC 3.56 (*)    Hemoglobin 11.4 (*)    HCT 35.3 (*)    Neutro Abs 22.9 (*)    Monocytes Absolute 1.3 (*)    Abs Immature Granulocytes 0.30 (*)    All other components within normal limits  URINALYSIS, COMPLETE (UACMP) WITH MICROSCOPIC - Abnormal; Notable for the following components:   Color, Urine YELLOW (*)    APPearance TURBID (*)    Ketones, ur 5 (*)    Protein, ur 100 (*)    Leukocytes,Ua MODERATE (*)    WBC, UA >50 (*)    Bacteria, UA MANY (*)    All other components within normal limits  LACTIC ACID, PLASMA - Abnormal; Notable for the following components:   Lactic Acid, Venous 6.1 (*)    All other components within normal limits  BRAIN NATRIURETIC PEPTIDE - Abnormal; Notable for the following components:   B Natriuretic Peptide 3,981.5 (*)    All other components within normal limits  TROPONIN I (HIGH SENSITIVITY) - Abnormal; Notable for the following components:   Troponin I (High  Sensitivity) >27,000 (*)    All other components within normal limits  SARS CORONAVIRUS 2 (TAT 6-24 HRS)  CULTURE, BLOOD (ROUTINE X 2)  CULTURE, BLOOD (ROUTINE X 2)  CULTURE, BLOOD (ROUTINE X 2)  CULTURE, BLOOD (ROUTINE X 2)  MAGNESIUM  LACTIC ACID, PLASMA  HEPARIN LEVEL (UNFRACTIONATED)  CBC  PROTIME-INR  APTT  CORTISOL  PROTIME-INR  PROCALCITONIN  APTT  STREP PNEUMONIAE URINARY ANTIGEN  LEGIONELLA PNEUMOPHILA SEROGP 1 UR AG  BASIC METABOLIC PANEL  MAGNESIUM  PHOSPHORUS  TSH  TYPE AND SCREEN  TROPONIN I (HIGH SENSITIVITY)   ____________________________________________  12 Lead EKG  Wandering baseline with motion artifact.  Atrial fibrillation with rate of 55 bpm.  Normal axis.  Intervals appear appropriate.  Concern for possible inferior ischemia with possible ST elevation to lead III and subtle ST depression in V2.  No clear ischemic features or STEMI. ____________________________________________  RADIOLOGY  ED MD interpretation: CT head reviewed by me without evidence of acute cardiopulmonary pathology. CXR with right basilar consolidation concerning for aspiration  Official radiology report(s): CT Head Wo Contrast  Result Date: 12/24/2020 CLINICAL DATA:  Mental status change, slurred speech, lethargy, dementia EXAM: CT HEAD WITHOUT CONTRAST TECHNIQUE: Contiguous axial images were obtained from the base of the skull through the vertex without intravenous contrast. COMPARISON:  11/25/2019 FINDINGS: Brain: Stable atrophy and chronic white matter microvascular ischemic changes throughout both cerebral hemispheres. Remote bilateral basal ganglia lacunar type infarcts with ex vacuo dilatation of the right lateral ventricle, unchanged. Stable mild ventricular enlargement. No acute intracranial hemorrhage, new mass lesion, definite acute infarction, midline shift, or extra-axial fluid collection. No focal mass effect or edema. Cisterns are patent. Cerebellar atrophy as well.  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 No hyperdense vessel. Skull: Remote left craniotomy.  Mastoids are clear. Sinuses/Orbits: No acute finding. Other: None. IMPRESSION: Stable atrophy, chronic white matter microvascular ischemic changes, and remote basal ganglia lacunar type infarcts. No interval change or acute intracranial abnormality by noncontrast CT. Electronically Signed   By: Judie Petit.  Shick M.D.   On: 12/24/2020 16:19   DG Chest Portable 1 View  Result Date: 12/24/2020 CLINICAL DATA:  Tachypnea, choking episode yesterday EXAM: PORTABLE CHEST 1 VIEW COMPARISON:  11/25/2019 FINDINGS: Single frontal view of the chest  demonstrates stable enlarged cardiac silhouette. There is increased vascular congestion, with minimal right basilar consolidation. No large effusion or pneumothorax. Lung volumes are diminished. IMPRESSION: 1. Increased central vascular congestion, with developing right basilar consolidation. This could reflect mild pulmonary edema, though aspiration could be considered given history of choking episode yesterday. Electronically Signed   By: Sharlet Salina M.D.   On: 12/24/2020 15:59    ____________________________________________   PROCEDURES and INTERVENTIONS  Procedure(s) performed (including Critical Care):  .1-3 Lead EKG Interpretation Performed by: Delton Prairie, MD Authorized by: Delton Prairie, MD     Interpretation: normal     ECG rate:  56   ECG rate assessment: normal     Rhythm: atrial fibrillation     Ectopy: none     Conduction: normal    .Critical Care Performed by: Delton Prairie, MD Authorized by: Delton Prairie, MD   Critical care provider statement:    Critical care time (minutes):  45   Critical care was necessary to treat or prevent imminent or life-threatening deterioration of the following conditions:  Sepsis and cardiac failure   Critical care was time spent personally by me on the following activities:  Discussions with consultants,  evaluation of patient's response to treatment, examination of patient, ordering and performing treatments and interventions, ordering and review of laboratory studies, ordering and review of radiographic studies, pulse oximetry, re-evaluation of patient's condition, obtaining history from patient or surrogate and review of old charts    Medications  lactated ringers infusion ( Intravenous New Bag/Given 12/24/20 1844)  aspirin chewable tablet 324 mg (324 mg Oral Not Given 12/24/20 1708)  heparin injection 3,350 Units (has no administration in time range)  heparin ADULT infusion 100 units/mL (25000 units/258mL) (has no administration in time range)  vancomycin (VANCOCIN) IVPB 1000 mg/200 mL premix (has no administration in time range)  ceFEPIme (MAXIPIME) 2 g in sodium chloride 0.9 % 100 mL IVPB (has no administration in time range)  azithromycin (ZITHROMAX) 500 mg in sodium chloride 0.9 % 250 mL IVPB (has no administration in time range)  docusate sodium (COLACE) capsule 100 mg (has no administration in time range)  polyethylene glycol (MIRALAX / GLYCOLAX) packet 17 g (has no administration in time range)  lactated ringers bolus 2,000 mL (2,000 mLs Intravenous New Bag/Given 12/24/20 1657)  Ampicillin-Sulbactam (UNASYN) 3 g in sodium chloride 0.9 % 100 mL IVPB (0 g Intravenous Stopped 12/24/20 1806)    ____________________________________________   MDM / ED COURSE  80 year old female presents from local SNF due to nonspecific facility complaints, and she has no complaints here in the ED, but with evidence of NSTEMI, sepsis and AKI requiring ICU admission.  She remained hemodynamically stable throughout her stay in the ED with waxing and waning heart rates due to her sick sinus syndrome.  Not hypoxic.  Exam demonstrates a tachypneic patient with chronic left-sided deficits.  She has nearly fully oriented and maintained that she feels fine despite her noted tachypnea.  She actually presents as a  possible STEMI alert, and cardiology evaluate the patient at the bedside and does not recommend emergent left heart catheterization per their note.  Her EKG has inferior elevations isolated to lead III and possible reciprocal depressions isolated lead V2, but is an atypical picture for STEMI.  Furthermore, she has no chest pain or anginal equivalent symptoms.  Blood work returns with quite a few pathologies with AKI that appears prerenal, remarkable elevation of troponin indicative of NSTEMI, as well as stigmata of sepsis  with leukocytosis and lactic acidosis.  Sepsis protocols were followed and she was provided Unasyn to cover for possible aspiration pneumonia as well as UTI as the source.  Patient was provided aspirin and heparin for her NSTEMI.  We will admit the patient to the ICU for further work-up and management due to her high risk for decline.   Clinical Course as of 12/24/20 1900  Fri Dec 24, 2020  1549 Reassessed.  Patient continues to report that she feels fine and is requesting coffee. [DS]  1552 White count noted.  Blood cultures ordered and I updated the nurse of this. [DS]  1642 Troponin returns remarkably elevated.  I informed Dr. Okey DupreEnd of this, he recommends hepatization, considering CT head without bleed. [DS]  1651 Updated nurse on plan of care regarding sepsis and NSTEMI, need for coags to be drawn [DS]  1830 I discuss the case with Dr. Allena KatzPatel, who asks that I speak with the intensivist first [DS]  1839 I spoke with Dr. Arlean HoppingSchertz, ICU , who will come evaluate the patient.  He indicates that patient will end up on their unit regardless if it is stepdown with a hospitalist or on the intensivist service.  He reports he wants to evaluate the patient prior to deciding [DS]  1845 Intensivist NP in the ED agrees to place admission orders. [DS]    Clinical Course User Index [DS] Delton PrairieSmith, Ulisses Vondrak, MD    ____________________________________________   FINAL CLINICAL IMPRESSION(S) / ED  DIAGNOSES  Final diagnoses:  Sepsis without acute organ dysfunction, due to unspecified organism [facility](HCC)  NSTEMI (non-ST elevated myocardial infarction) (HCC)  AKI (acute kidney injury) [facility](HCC)     ED Discharge Orders    None       Jayveion Stalling   Note:  This document was prepared using Dragon voice recognition software and may include unintentional dictation errors.   Delton PrairieSmith, Kennie Karapetian, MD 12/24/20 [phone]

## 2020-12-24 NOTE — Consult Note (Signed)
CODE SEPSIS - PHARMACY COMMUNICATION  **Broad Spectrum Antibiotics should be administered within 1 hour of Sepsis diagnosis**  Time Code Sepsis Called/Page Received: 1628  Antibiotics Ordered: Unasyn  Time of 1st antibiotic administration: 1720  Additional action taken by pharmacy: 1713 called RN to check status of abx  If necessary, Name of Provider/Nurse Contacted: Addison  Reatha Armour, PharmD Pharmacy Resident  12/24/2020 4:35 PM

## 2020-12-25 ENCOUNTER — Inpatient Hospital Stay: Payer: Medicare Other

## 2020-12-25 ENCOUNTER — Inpatient Hospital Stay
Admit: 2020-12-25 | Discharge: 2020-12-25 | Disposition: A | Discharge status: Home / Self Care | Payer: Medicare Other | Attending: Pulmonary Disease | Admitting: Pulmonary Disease

## 2020-12-25 DIAGNOSIS — L8990 Pressure ulcer of unspecified site, unspecified stage: Secondary | ICD-10-CM | POA: Insufficient documentation

## 2020-12-25 DIAGNOSIS — A419 Sepsis, unspecified organism: Secondary | ICD-10-CM | POA: Diagnosis not present

## 2020-12-25 DIAGNOSIS — N179 Acute kidney failure, unspecified: Secondary | ICD-10-CM

## 2020-12-25 DIAGNOSIS — I2111 ST elevation (STEMI) myocardial infarction involving right coronary artery: Secondary | ICD-10-CM

## 2020-12-25 LAB — BASIC METABOLIC PANEL
Anion gap: 14 (ref 5–15)
Anion gap: 15 (ref 5–15)
BUN: 78 mg/dL — ABNORMAL HIGH (ref 8–23)
BUN: 83 mg/dL — ABNORMAL HIGH (ref 8–23)
CO2: 24 mmol/L (ref 22–32)
CO2: 24 mmol/L (ref 22–32)
Calcium: 8.2 mg/dL — ABNORMAL LOW (ref 8.9–10.3)
Calcium: 8.1 mg/dL — ABNORMAL LOW (ref 8.9–10.3)
Chloride: 100 mmol/L (ref 98–111)
Chloride: 100 mmol/L (ref 98–111)
Creatinine, Ser: 3.96 mg/dL — ABNORMAL HIGH (ref 0.44–1.00)
Creatinine, Ser: 4.08 mg/dL — ABNORMAL HIGH (ref 0.44–1.00)
GFR, Estimated: 11 mL/min — ABNORMAL LOW (ref >60)
GFR, Estimated: 11 mL/min — ABNORMAL LOW (ref >60)
Glucose, Bld: 124 mg/dL — ABNORMAL HIGH (ref 70–99)
Glucose, Bld: 152 mg/dL — ABNORMAL HIGH (ref 70–99)
Potassium: 5 mmol/L (ref 3.5–5.1)
Potassium: 4.9 mmol/L (ref 3.5–5.1)
Sodium: 138 mmol/L (ref 135–145)
Sodium: 139 mmol/L (ref 135–145)

## 2020-12-25 LAB — FIBRINOGEN: Fibrinogen: 516 mg/dL — ABNORMAL HIGH (ref 210–475)

## 2020-12-25 LAB — CBC
HCT: 32.4 % — ABNORMAL LOW (ref 36.0–46.0)
HCT: 31.1 % — ABNORMAL LOW (ref 36.0–46.0)
HCT: 29.1 % — ABNORMAL LOW (ref 36.0–46.0)
Hemoglobin: 11.1 g/dL — ABNORMAL LOW (ref 12.0–15.0)
Hemoglobin: 10.6 g/dL — ABNORMAL LOW (ref 12.0–15.0)
Hemoglobin: 9.7 g/dL — ABNORMAL LOW (ref 12.0–15.0)
MCH: 32.5 pg (ref 26.0–34.0)
MCH: 32.6 pg (ref 26.0–34.0)
MCH: 31.8 pg (ref 26.0–34.0)
MCHC: 34.3 g/dL (ref 30.0–36.0)
MCHC: 34.1 g/dL (ref 30.0–36.0)
MCHC: 33.3 g/dL (ref 30.0–36.0)
MCV: 94.7 fL (ref 80.0–100.0)
MCV: 95.7 fL (ref 80.0–100.0)
MCV: 95.4 fL (ref 80.0–100.0)
Platelets: 175 10*3/uL (ref 150–400)
Platelets: 214 10*3/uL (ref 150–400)
Platelets: 198 10*3/uL (ref 150–400)
RBC: 3.42 MIL/uL — ABNORMAL LOW (ref 3.87–5.11)
RBC: 3.25 MIL/uL — ABNORMAL LOW (ref 3.87–5.11)
RBC: 3.05 MIL/uL — ABNORMAL LOW (ref 3.87–5.11)
RDW: 14 % (ref 11.5–15.5)
RDW: 14.2 % (ref 11.5–15.5)
RDW: 13.9 % (ref 11.5–15.5)
WBC: 23.5 10*3/uL — ABNORMAL HIGH (ref 4.0–10.5)
WBC: 19.7 10*3/uL — ABNORMAL HIGH (ref 4.0–10.5)
WBC: 17 10*3/uL — ABNORMAL HIGH (ref 4.0–10.5)
nRBC: 0.1 % (ref 0.0–0.2)
nRBC: 0 % (ref 0.0–0.2)
nRBC: 0 % (ref 0.0–0.2)

## 2020-12-25 LAB — BLOOD GAS, ARTERIAL
Acid-base deficit: 13.7 mmol/L — ABNORMAL HIGH (ref 0.0–2.0)
Bicarbonate: 9.2 mmol/L — ABNORMAL LOW (ref 20.0–28.0)
O2 Saturation: 96.5 %
Patient temperature: 37
pCO2 arterial: <19 mmHg — CL (ref 32.0–48.0)
pH, Arterial: 7.37 (ref 7.350–7.450)
pO2, Arterial: 88 mmHg (ref 83.0–108.0)

## 2020-12-25 LAB — COMPREHENSIVE METABOLIC PANEL
ALT: 136 U/L — ABNORMAL HIGH (ref 0–44)
AST: 355 U/L — ABNORMAL HIGH (ref 15–41)
Albumin: 2.5 g/dL — ABNORMAL LOW (ref 3.5–5.0)
Alkaline Phosphatase: 93 U/L (ref 38–126)
Anion gap: 18 — ABNORMAL HIGH (ref 5–15)
BUN: 71 mg/dL — ABNORMAL HIGH (ref 8–23)
CO2: 23 mmol/L (ref 22–32)
Calcium: 7.9 mg/dL — ABNORMAL LOW (ref 8.9–10.3)
Chloride: 103 mmol/L (ref 98–111)
Creatinine, Ser: 3.83 mg/dL — ABNORMAL HIGH (ref 0.44–1.00)
GFR, Estimated: 11 mL/min — ABNORMAL LOW (ref >60)
Glucose, Bld: 134 mg/dL — ABNORMAL HIGH (ref 70–99)
Potassium: 5.3 mmol/L — ABNORMAL HIGH (ref 3.5–5.1)
Sodium: 144 mmol/L (ref 135–145)
Total Bilirubin: 0.6 mg/dL (ref 0.3–1.2)
Total Protein: 5.2 g/dL — ABNORMAL LOW (ref 6.5–8.1)

## 2020-12-25 LAB — LIPID PANEL
Cholesterol: 78 mg/dL (ref 0–200)
HDL: 43 mg/dL (ref >40)
LDL Cholesterol: 24 mg/dL (ref 0–99)
Total CHOL/HDL Ratio: 1.8 RATIO
Triglycerides: 56 mg/dL (ref <150)
VLDL: 11 mg/dL (ref 0–40)

## 2020-12-25 LAB — AMMONIA: Ammonia: 11 umol/L (ref 9–35)

## 2020-12-25 LAB — TYPE AND SCREEN
ABO/RH(D): A POS
Antibody Screen: NEGATIVE

## 2020-12-25 LAB — GLUCOSE, CAPILLARY
Glucose-Capillary: 115 mg/dL — ABNORMAL HIGH (ref 70–99)
Glucose-Capillary: 128 mg/dL — ABNORMAL HIGH (ref 70–99)
Glucose-Capillary: 130 mg/dL — ABNORMAL HIGH (ref 70–99)
Glucose-Capillary: 139 mg/dL — ABNORMAL HIGH (ref 70–99)
Glucose-Capillary: 139 mg/dL — ABNORMAL HIGH (ref 70–99)
Glucose-Capillary: 34 mg/dL — CL (ref 70–99)
Glucose-Capillary: 98 mg/dL (ref 70–99)

## 2020-12-25 LAB — ECHOCARDIOGRAM COMPLETE
AV Peak grad: 5.1 mmHg
Ao pk vel: 1.13 m/s
Area-P 1/2: 5.58 cm2
Calc EF: 14.5 %
Height: 66 in
MV M vel: 5.32 m/s
MV Peak grad: 113.2 mmHg
P 1/2 time: 537 msec
Radius: 0.65 cm
S' Lateral: 4.4 cm
Single Plane A2C EF: -0.2 %
Single Plane A4C EF: 26.6 %
Weight: 1964.8 oz

## 2020-12-25 LAB — LACTIC ACID, PLASMA
Lactic Acid, Venous: 6.9 mmol/L (ref 0.5–1.9)
Lactic Acid, Venous: 2.3 mmol/L (ref 0.5–1.9)
Lactic Acid, Venous: 2.3 mmol/L (ref 0.5–1.9)
Lactic Acid, Venous: 2.3 mmol/L (ref 0.5–1.9)

## 2020-12-25 LAB — APTT: aPTT: 36 seconds (ref 24–36)

## 2020-12-25 LAB — MRSA PCR SCREENING: MRSA by PCR: POSITIVE — AB

## 2020-12-25 LAB — PROCALCITONIN: Procalcitonin: 4.9 ng/mL

## 2020-12-25 LAB — PROTIME-INR
INR: 1.4 — ABNORMAL HIGH (ref 0.8–1.2)
Prothrombin Time: 17.2 seconds — ABNORMAL HIGH (ref 11.4–15.2)

## 2020-12-25 LAB — HEPARIN LEVEL (UNFRACTIONATED): Heparin Unfractionated: 0.11 IU/mL — ABNORMAL LOW (ref 0.30–0.70)

## 2020-12-25 LAB — SARS CORONAVIRUS 2 (TAT 6-24 HRS): SARS Coronavirus 2: NEGATIVE

## 2020-12-25 LAB — STREP PNEUMONIAE URINARY ANTIGEN: Strep Pneumo Urinary Antigen: NEGATIVE

## 2020-12-25 LAB — TROPONIN I (HIGH SENSITIVITY): Troponin I (High Sensitivity): >27000 ng/L (ref <18)

## 2020-12-25 LAB — PHOSPHORUS: Phosphorus: 6.2 mg/dL — ABNORMAL HIGH (ref 2.5–4.6)

## 2020-12-25 LAB — ABO/RH: ABO/RH(D): A POS

## 2020-12-25 LAB — D-DIMER, QUANTITATIVE: D-Dimer, Quant: 4.53 ug/mL-FEU — ABNORMAL HIGH (ref 0.00–0.50)

## 2020-12-25 LAB — MAGNESIUM: Magnesium: 2.1 mg/dL (ref 1.7–2.4)

## 2020-12-25 MED ORDER — MUPIROCIN 2 % EX OINT
1.0000 "application " | TOPICAL_OINTMENT | Freq: Two times a day (BID) | CUTANEOUS | Status: DC
  Administered 2020-12-25 – 2020-12-28 (×7): 1 via NASAL
  Filled 2020-12-25: qty 22

## 2020-12-25 MED ORDER — HEPARIN (PORCINE) 25000 UT/250ML-% IV SOLN
1050.0000 [IU]/h | INTRAVENOUS | Status: DC
  Administered 2020-12-25: 600 [IU]/h via INTRAVENOUS
  Administered 2020-12-26 – 2020-12-27 (×2): 1050 [IU]/h via INTRAVENOUS
  Filled 2020-12-25 (×3): qty 250

## 2020-12-25 MED ORDER — ACETAMINOPHEN 650 MG RE SUPP
650.0000 mg | Freq: Four times a day (QID) | RECTAL | Status: DC | PRN
  Administered 2020-12-27: 650 mg via RECTAL
  Filled 2020-12-25: qty 1

## 2020-12-25 MED ORDER — CHLORHEXIDINE GLUCONATE CLOTH 2 % EX PADS
6.0000 | MEDICATED_PAD | Freq: Every day | CUTANEOUS | Status: DC
  Administered 2020-12-25 – 2020-12-28 (×4): 6 via TOPICAL

## 2020-12-25 MED ORDER — FUROSEMIDE 10 MG/ML IJ SOLN
80.0000 mg | Freq: Once | INTRAMUSCULAR | Status: AC
  Administered 2020-12-25: 80 mg via INTRAVENOUS
  Filled 2020-12-25: qty 8

## 2020-12-25 MED ORDER — SODIUM CHLORIDE 0.9 % IV SOLN
3.0000 g | Freq: Two times a day (BID) | INTRAVENOUS | Status: DC
  Administered 2020-12-25: 3 g via INTRAVENOUS
  Filled 2020-12-25: qty 3

## 2020-12-25 MED ORDER — CHLORHEXIDINE GLUCONATE CLOTH 2 % EX PADS
6.0000 | MEDICATED_PAD | Freq: Every day | CUTANEOUS | Status: DC
  Administered 2020-12-25 – 2020-12-27 (×3): 6 via TOPICAL

## 2020-12-25 MED ORDER — HEPARIN BOLUS VIA INFUSION
1650.0000 [IU] | Freq: Once | INTRAVENOUS | Status: AC
  Administered 2020-12-25: 1650 [IU] via INTRAVENOUS
  Filled 2020-12-25: qty 1650

## 2020-12-25 MED ORDER — FUROSEMIDE 10 MG/ML IJ SOLN
100.0000 mg | Freq: Once | INTRAVENOUS | Status: AC
  Administered 2020-12-25: 100 mg via INTRAVENOUS
  Filled 2020-12-25: qty 10

## 2020-12-25 MED ORDER — SODIUM BICARBONATE 8.4 % IV SOLN
150.0000 meq | Freq: Once | INTRAVENOUS | Status: AC
  Administered 2020-12-25: 150 meq via INTRAVENOUS

## 2020-12-25 MED ORDER — SODIUM CHLORIDE 0.9 % IV SOLN
1.0000 g | INTRAVENOUS | Status: DC
  Administered 2020-12-25 – 2020-12-28 (×4): 1 g via INTRAVENOUS
  Filled 2020-12-25 (×4): qty 1

## 2020-12-25 MED ORDER — ACETAMINOPHEN 325 MG PO TABS
650.0000 mg | ORAL_TABLET | Freq: Four times a day (QID) | ORAL | Status: DC | PRN
  Administered 2020-12-25: 650 mg via ORAL
  Filled 2020-12-25: qty 2

## 2020-12-25 MED ORDER — SODIUM BICARBONATE 8.4 % IV SOLN
INTRAVENOUS | Status: DC
  Filled 2020-12-25: qty 150

## 2020-12-25 MED ORDER — STERILE WATER FOR INJECTION IV SOLN
Freq: Once | INTRAVENOUS | Status: AC
  Filled 2020-12-25: qty 150

## 2020-12-25 MED ORDER — DOPAMINE-DEXTROSE 3.2-5 MG/ML-% IV SOLN
3.0000 ug/kg/min | INTRAVENOUS | Status: DC
  Administered 2020-12-25 – 2020-12-27 (×3): 5 ug/kg/min via INTRAVENOUS
  Filled 2020-12-25 (×2): qty 250

## 2020-12-25 NOTE — Consult Note (Signed)
Pharmacy Antibiotic Note  Joanna Stone is a 80 y.o. female admitted on 12/24/2020 with sepsis. Pt presented with slurred speech and lethargy, last known well time unknown. PMH includes bradycardia, dementia, stroke, seizures, HTN, CKD, and CVA. EKG concerning for STEMI. Pt choked prior to admission, concern for aspiration pneumonia and possible UTI. Pharmacy has been consulted for Unasyn and vancomycin dosing.  Plan: Pt received vancomycin 1250 mg x 1 loading dose on 4/15 20:38. Will order a 24 hour level and dose per levels. MRSA PCR positive.   Unasyn was not ordered. Will order Unasyn 3 g BID - monitor Scr. If CrCl < 10 mL/min change to daily dosing.   Height: 5\' 6"  (167.6 cm) Weight: 55.7 kg (122 lb 12.8 oz) IBW/kg (Calculated) : 59.3  Temp (24hrs), Avg:97.8 F (36.6 C), Min:96 F (35.6 C), Max:98.8 F (37.1 C)  Recent Labs  Lab 12/24/20 1516 12/24/20 1540 12/24/20 2024 12/25/20 0124  WBC  --  26.0*  --  23.5*  CREATININE  --  3.91*  --  3.83*  LATICACIDVEN 6.1*  --  9.9* 6.9*    Estimated Creatinine Clearance: 10.5 mL/min (A) (by C-G formula based on SCr of 3.83 mg/dL (H)).    Allergies  Allergen Reactions  . Ultram [Tramadol]     vomiting    Antimicrobials this admission: 4/15 cefepime x 1 dose 4/15 vanc x 1 dose 4/15 Unasyn >>  Dose adjustments this admission: N/A  Microbiology results: 4/15 BCx: pending 4/15 UCx: ordered  Thank you for allowing pharmacy to be a part of this patient's care.  5/15, PharmD Pharmacy Resident  12/25/2020 3:21 AM

## 2020-12-25 NOTE — Procedures (Signed)
Central Venous Catheter Insertion Procedure Note  Joanna Stone  [id number]  December 26, 1940  Date:12/25/20  Time:2:12 AM   Provider Performing:Eldean Klatt L [place]   Procedure: Insertion of Non-tunneled Central Venous [phone]) with US guidance ([id number])   Indication(s) Medication administration and Difficult access  Consent Risks of the procedure as well as the alternatives and risks of each were explained to the patient and/or caregiver.  Consent for the procedure was obtained and is signed in the bedside chart  Anesthesia Topical only with 1% lidocaine   Timeout Verified patient identification, verified procedure, site/side was marked, verified correct patient position, special equipment/implants available, medications/allergies/relevant history reviewed, required imaging and test results available.  Sterile Technique Maximal sterile technique including full sterile barrier drape, hand hygiene, sterile gown, sterile gloves, mask, hair covering, sterile ultrasound probe cover (if used).  Procedure Description Area of catheter insertion was cleaned with chlorhexidine and draped in sterile fashion.  With real-time ultrasound guidance a central venous catheter was placed into the right internal jugular vein. Nonpulsatile blood flow and easy flushing noted in all ports.  The catheter was sutured in place and sterile dressing applied.  Complications/Tolerance None; patient tolerated the procedure well. Chest X-ray is ordered to verify placement for internal jugular or subclavian cannulation.   Chest x-ray is not ordered for femoral cannulation.  EBL Minimal  Specimen(s) None   Betsey Holiday, AGACNP-BC Acute Care Nurse Practitioner [facility]   [phone] / [phone] Please see Amion for pager details.

## 2020-12-25 NOTE — Progress Notes (Addendum)
PHARMACY CONSULT NOTE  Pharmacy Consult for Electrolyte Monitoring and Replacement   Recent Labs: Potassium (mmol/L)  Date Value  12/25/2020 5.0  08/01/2012 4.6   Magnesium (mg/dL)  Date Value  38/75/6433 2.1  07/31/2012 2.2   Calcium (mg/dL)  Date Value  29/51/8841 8.2 (L)   Calcium, Total (mg/dL)  Date Value  66/02/3015 8.8   Albumin (g/dL)  Date Value  09/19/3233 2.5 (L)  07/31/2012 4.0   Phosphorus (mg/dL)  Date Value  57/32/2025 6.2 (H)   Sodium (mmol/L)  Date Value  12/25/2020 138  08/01/2012 140   Corrected Ca: 9.4 mg/dL  Assessment: 80 yo F presented with slurred speech and lethargy, last known well time unknown. PMH includes bradycardia, dementia, stroke, seizures, HTN, CKD, and CVA. EKG concerning for STEMI. Pharmacy consulted to dose heparin for ACS/STEMI. Pharmacy has been asked to review and replace electrolytes while this patient is in the ICU.  Goal of Therapy:  Potassium 4.0 - 5.1 mmol/L Magnesium 2.0 - 2.4 mg/dL All Other Electrolytes WNL  Plan:   No electrolyte replacement at this time  Re-check electrolytes with am labs  Lowella Bandy ,PharmD Clinical Pharmacist 12/25/2020 9:21 AM

## 2020-12-25 NOTE — Consult Note (Signed)
ANTICOAGULATION CONSULT NOTE  Pharmacy Consult for heparin Indication: chest pain/ACS  Patient Measurements: Height: 5\' 6"  (167.6 cm) Weight: 55.7 kg (122 lb 12.8 oz) IBW/kg (Calculated) : 59.3 Heparin Dosing Weight: 55.7  Vital Signs: Temp: 100.9 F (38.3 C) (04/16 1700) Temp Source: Core (04/16 0800) BP: 96/55 (04/16 1700) Pulse Rate: 62 (04/16 1700)  Labs: Recent Labs    12/24/20 1540 12/24/20 1640 12/24/20 1929 12/24/20 2155 12/25/20 0124 12/25/20 0419 12/25/20 0425 12/25/20 0813 12/25/20 1019 12/25/20 1757  HGB 11.4*  --   --    < > 11.1*  --  10.6*  --   --  9.7*  HCT 35.3*  --   --    < > 32.4*  --  31.1*  --   --  29.1*  PLT 227  --   --   --  175  --  214  --   --  198  APTT  --   --  34  --   --  36  --   --   --   --   LABPROT  --   --  16.5*  --   --  17.2*  --   --   --   --   INR  --   --  1.3*  --   --  1.4*  --   --   --   --   CREATININE 3.91*  --   --   --  3.83*  --   --  3.96* 4.08*  --   TROPONINIHS >27,000* >27,000*  --   --  >27,000*  --   --   --   --   --    < > = values in this interval not displayed.    Estimated Creatinine Clearance: 9.8 mL/min (A) (by C-G formula based on SCr of 4.08 mg/dL (H)).   Medications:  No anticoagulants prior to admission per chart review   Assessment: 80 yo F presented with slurred speech and lethargy, last known well time unknown. PMH includes bradycardia, dementia, stroke, seizures, HTN, CKD, and CVA. EKG concerning for STEMI. Pharmacy consulted to dose heparin for ACS/STEMI. Heparin was stopped due to hematochezia but given elevated cardiac risk it is being restarted. HS Tn elevation  Date Time HL Rate/comment 4/16 1757 0.11 600 units/hr - subtherapeutic  Goal of Therapy:  Heparin level 0.3-0.7 units/ml Monitor platelets by anticoagulation protocol: Yes   Plan:  Heparin level subtherapeutic, bolus 1650 units x 1 and increase heparin infusion rate to 800 units/hr Check anti-Xa level in 8 hours and  daily while on heparin Continue to monitor H&H and platelets  5/16, PharmD 12/25/2020 6:35 PM

## 2020-12-25 NOTE — Consult Note (Signed)
Pharmacy Antibiotic Note  Joanna Stone is a 80 y.o. female admitted on 12/24/2020 with sepsis. Pt presented with slurred speech and lethargy, last known well time unknown. PMH includes bradycardia, dementia, stroke, seizures, HTN, CKD, and CVA. EKG concerning for STEMI. Pt choked prior to admission, concern for aspiration pneumonia and possible UTI. Pharmacy has been consulted for cefepime and vancomycin dosing. Pt received vancomycin 1250 mg x 1 loading dose on 4/15 20:38  Plan:  1) given poor renal function continue to dose vancomycin based on levels  Ke: 0.013 h-1  T1/2: 52.9 h  Vancomycin level with 4/17 am labs  2) start cefepime 1 gram IV every 24 hours  Height: 5\' 6"  (167.6 cm) Weight: 55.7 kg (122 lb 12.8 oz) IBW/kg (Calculated) : 59.3  Temp (24hrs), Avg:98.1 F (36.7 C), Min:96 F (35.6 C), Max:99.14 F (37.3 C)  Recent Labs  Lab 12/24/20 1516 12/24/20 1540 12/24/20 2024 12/25/20 0124 12/25/20 0425  WBC  --  26.0*  --  23.5* 19.7*  CREATININE  --  3.91*  --  3.83*  --   LATICACIDVEN 6.1*  --  9.9* 6.9*  --     Estimated Creatinine Clearance: 10.5 mL/min (A) (by C-G formula based on SCr of 3.83 mg/dL (H)).    Allergies  Allergen Reactions  . Ultram [Tramadol]     vomiting    Antimicrobials this admission: 4/15 Unasyn >> 4/16 4/16 cefepime >> 4/15 vancomycin >>  Microbiology results: 4/15 BCx: NG < 24h 4/15 UCx: ordered 4/15 MRSA PCR + 4/15 SARS CoV-2: negative 4/15 influenza A/B: negative  Thank you for allowing pharmacy to be a part of this patient's care.  5/15, PharmD 12/25/2020 7:34 AM

## 2020-12-25 NOTE — Consult Note (Signed)
ANTICOAGULATION CONSULT NOTE  Pharmacy Consult for heparin Indication: chest pain/ACS  Patient Measurements: Height: 5\' 6"  (167.6 cm) Weight: 55.7 kg (122 lb 12.8 oz) IBW/kg (Calculated) : 59.3 Heparin Dosing Weight: 55.7  Vital Signs: Temp: 99.14 F (37.3 C) (04/16 0600) Temp Source: Rectal (04/15 2139) BP: 134/77 (04/16 0600) Pulse Rate: 99 (04/16 0500)  Labs: Recent Labs    12/24/20 1540 12/24/20 1640 12/24/20 1929 12/24/20 2155 12/25/20 0124 12/25/20 0419 12/25/20 0425  HGB 11.4*  --   --  10.9* 11.1*  --  10.6*  HCT 35.3*  --   --  34.6* 32.4*  --  31.1*  PLT 227  --   --   --  175  --  214  APTT  --   --  34  --   --  36  --   LABPROT  --   --  16.5*  --   --  17.2*  --   INR  --   --  1.3*  --   --  1.4*  --   CREATININE 3.91*  --   --   --  3.83*  --   --   TROPONINIHS >27,000* >27,000*  --   --  >27,000*  --   --     Estimated Creatinine Clearance: 10.5 mL/min (A) (by C-G formula based on SCr of 3.83 mg/dL (H)).   Medications:  No anticoagulants prior to admission per chart review   Assessment: 80 yo F presented with slurred speech and lethargy, last known well time unknown. PMH includes bradycardia, dementia, stroke, seizures, HTN, CKD, and CVA. EKG concerning for STEMI. Pharmacy consulted to dose heparin for ACS/STEMI. Heparin was stopped due to hematochezia but given elevated cardiac risk it is being restarted. HS Tn elevation  Goal of Therapy:  Heparin level 0.3-0.7 units/ml Monitor platelets by anticoagulation protocol: Yes   Plan:   restart heparin infusion at 600 units/hr  Check anti-Xa level in 8 hours   Continue to monitor H&H and platelets  76, PharmD 12/25/2020 7:38 AM

## 2020-12-25 NOTE — Progress Notes (Signed)
Phone call from on call social worker Avelino Leeds [employer] [facility] has guardianship of patient. Contact is Theodis Shove [phone] or supervisor Sharlyne Cai [phone].  If there are weekend needs please call [employer] [phone].  Per Morrie Sheldon, they are looking into DNR for patient.   [address] LCSW Transition of Care [phone]

## 2020-12-25 NOTE — Significant Event (Signed)
CODE BLUE called. Nursing found patient unresponsive and pulseless with asystole on monitor. No intervention performed as patient quickly regained consciousness and pulse within 25 seconds per ECG reading and bedside report.  - DSS & son alerted, consent obtained for central line - STAT ABG: 7.37/ 16/ 88/ 9 - STAT labs - 3 amps of sodium bicarb followed by sodium bicarbonate infusion for additional 150 meq - consider dopamine if HR sustains < 45 - STAT EKG 12 lead  Betsey Holiday, AGACNP-BC Acute Care Nurse Practitioner [facility]   [phone] / [phone] Please see Amion for pager details.

## 2020-12-25 NOTE — Progress Notes (Signed)
Pt arrived from ED at approx 2200/ provider at bedside, pt SB 40-50's pt Disoriented x 3, pt hypothermic- warming blanket applied. All other VSS, approx 0130 pt alarmed asystole, went to pt room, pt non responsive, no pulse, code blue called. Pt with pulse and alert shortly after, provider at bedside. 3 Amps of bicarb given. See epic for further details.    Pt family updated by provider.

## 2020-12-25 NOTE — Evaluation (Signed)
Clinical/Bedside Swallow Evaluation Patient Details  Name: Joanna Stone MRN: [id number] Date of Birth: 1940/09/26  Today's Date: 12/25/2020 Time: SLP Start Time (ACUTE ONLY): 1035 SLP Stop Time (ACUTE ONLY): 1135 SLP Time Calculation (min) (ACUTE ONLY): 60 min  Past Medical History:  Past Medical History:  Diagnosis Date  . Arthritis   . Bradycardia    seen in 2018 by Dr. Graciela Husbands (cardiology/electrophysiology), no intervention recommended  . CKD (chronic kidney disease), stage III (HCC)   . 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 History of brain surgery 2001  . History of depression   . Hypertension   . Sinus node dysfunction w/ competing jxnl rhythm    a. 07/2012 Ech: EF >55%, no rwma, mild MR/TR/AI, mildly dil LA.  . Tobacco abuse    a. 05/2017 currently smokes 2 cigarettes/day.  . Vascular dementia [facility])    Past Surgical History:  Past Surgical History:  Procedure Laterality Date  . ABDOMINAL HYSTERECTOMY    . APPENDECTOMY    . TONSILLECTOMY     HPI:  Pt is a 80 y.o. femalewho presents to the ED for evaluation of altered mentation and change of status. Chart review indicates history of stroke on aspirin and Plavix, HTN, CVA with left-sided deficits, Vascular Dementia, CKD 3, Sick sinus syndrome. Reportedly full code.  Patient resides at a local SNF and is Bedbound.  EMS reports that the facility indicates that she has not been at her baseline yesterday or today. Increased confusion and lethargy, possible choking episode yesterday.  Pt admitted for inferior STEMI (medically managed), septic shock 2/2 suspected CAP, profound lactic acidosis, AKI and elevated LFTs per chart notes.  CXR: suggesting L>R bibasilar opacities and cardiomegaly, RUQ abd suggesting fatty liver and CT a/p showing small amount of free pelvic fluid without evidence for rupture or perforation.  Of note, narrowing of the distal esophagus at the site of the Hiatal Hernia which does not allow  passage of the barium tablet per GI Barium study in chart notes in 2015. Unsure if pt had f/u w/ GI then for potential dillitation.   Assessment / Plan / Recommendation Clinical Impression  Pt appears to present w/ oropharyngeal phase dysphagia and risk for aspiration in light of Significantly declined Cognitive status; advanced Dementia. This impacts her overall awareness/engagement and safety during po tasks which increases risk for aspiration, choking. This risk can be reduced when following general aspiration precautions, using a modified diet, and given feeding assistance. She required verbal/ visual during feeding and was encouraged NOT to talk w/ food still in her mouth or immediately after swallowing -- inconsistent following instructions. Pt consumed trials of ice chips, purees, and TSP/Cup/Straw sips of Nectar liquids w/ No immediate, overt clinical s/s of aspiration noted; no decline in vocal quality during few verbalizations, no cough, and no decline in respiratory status during/post trials. O2 sats remained 98%. Oral phase was adequate for bolus management and oral clearing of the boluses given. NSG gave Whole pill in Puree during session w/ SLP w/ appropriate clearing/swallowing. OM exam was cursory/observation, but No unilateral weakness noted. Pt appeared easily distracted w/ quick (body) movements and talking during trials - baseline Vacular Dementia can impact swallowing awareness and safety. D/t pt's declined Cognitive status, and her risk for aspiration, recommend initiation of the dysphagia level 1(puree) w/ Nectar liquids. Aspiration precautions; reduce Distractions during meals. Pills Whole vs Crushed in Puree for safer swallowing. Support w/ feeding at meals. NSG updated. ST  services recommends follow w/ Palliative Care for GOC. ST services will follow for any upgrade of diet when appropriate. Largely suspect that pt's advanced Dementia and behaviors could continue to hamper oral intake  and upgrade of diet. Precautions posted in room. SLP Visit Diagnosis: Dysphagia, oropharyngeal phase (R13.12) (Dementia)    Aspiration Risk  Mild aspiration risk;Moderate aspiration risk;Risk for inadequate nutrition/hydration    Diet Recommendation  Dysphagia level 1 w/ Nectar liquids; strict aspiration precautions. Feeding Support at all meals d/t Cognitive decline and LUE contraction. Reduce distractions at meals.   Medication Administration: Whole meds with puree (vs need to Crush for safety of swallowing)    Other  Recommendations Recommended Consults:  (Palliative Care consult, Dietician f/u) Oral Care Recommendations: Oral care BID;Oral care before and after PO;Staff/trained caregiver to provide oral care Other Recommendations: Order thickener from pharmacy;Prohibited food (jello, ice cream, thin soups);Remove water pitcher;Have oral suction available   Follow up Recommendations Skilled Nursing facility      Frequency and Duration min 3x week  2 weeks       Prognosis Prognosis for Safe Diet Advancement: Fair Barriers to Reach Goals: Cognitive deficits;Time post onset;Severity of deficits Barriers/Prognosis Comment: decline in status since last assessment in 2018      Swallow Study   General Date of Onset: 12/24/20 HPI: Pt is a 80 y.o. femalewho presents to the ED for evaluation of altered mentation and change of status. Chart review indicates history of stroke on aspirin and Plavix, HTN, CVA with left-sided deficits, Vascular Dementia, CKD 3, Sick sinus syndrome. Reportedly full code.  Patient resides at a local SNF and is Bedbound.  EMS reports that the facility indicates that she has not been at her baseline yesterday or today. Increased confusion and lethargy, possible choking episode yesterday.  Pt admitted for inferior STEMI (medically managed), septic shock 2/2 suspected CAP, profound lactic acidosis, AKI and elevated LFTs per chart notes.  CXR: suggesting L>R bibasilar  opacities and cardiomegaly, RUQ abd suggesting fatty liver and CT a/p showing small amount of free pelvic fluid without evidence for rupture or perforation.  Of note, narrowing of the distal esophagus at the site of the Hiatal Hernia which does not allow passage of the barium tablet per GI Barium study in chart notes in 2015. Unsure if pt had f/u w/ GI then for potential dillitation. Type of Study: Bedside Swallow Evaluation Previous Swallow Assessment: 2018 -  appears a decline in status since then Diet Prior to this Study: NPO Temperature Spikes Noted: No (wbc 19.7 declining) Respiratory Status: Room air History of Recent Intubation: No Behavior/Cognition: Alert;Cooperative;Pleasant mood;Confused;Distractible;Requires cueing Oral Cavity Assessment: Within Functional Limits Oral Care Completed by SLP: Yes Oral Cavity - Dentition: Edentulous Self-Feeding Abilities: Total assist Patient Positioning: Upright in bed (positioning given - pt tended to have a head back position) Baseline Vocal Quality: Low vocal intensity (muttered/mumbled speech) Volitional Cough: Cognitively unable to elicit Volitional Swallow: Unable to elicit    Oral/Motor/Sensory Function Overall Oral Motor/Sensory Function: Within functional limits (w/ bolus management; lingual protrusion)   Ice Chips Ice chips: Impaired Presentation: Spoon (fed; 5 trials) Oral Phase Impairments: Poor awareness of bolus Pharyngeal Phase Impairments:  (none) Other Comments: swallowed quickly w/out allowing ice chips to melt at times   Thin Liquid Thin Liquid: Not tested    Nectar Thick Nectar Thick Liquid: Within functional limits (grossly) Presentation: Cup;Straw;Spoon (fed; ~3-4 ozs) Other Comments: talked immediately following swallows   Honey Thick Honey Thick Liquid: Not tested  Puree Puree: Within functional limits (grossly) Presentation: Spoon (fed; ~3-4 ozs) Other Comments: talked immediately following swallows   Solid      Solid: Not tested Other Comments: edentulous         Jerilynn Som, MS, CCC-SLP Speech Language Pathologist Rehab Services [phone] [facility] 12/25/2020,12:18 PM

## 2020-12-25 NOTE — Progress Notes (Signed)
*  PRELIMINARY RESULTS* Echocardiogram 2D Echocardiogram has been performed.  Lenor Coffin 12/25/2020, 9:00 AM

## 2020-12-25 NOTE — Progress Notes (Addendum)
Progress Note  Patient Name: GERALENE AFSHAR Date of Encounter: 12/25/2020  Primary Cardiologist: [facility], Dr. Okey Dupre  Subjective   Unable to communicate with me but nods yes and no.  Nod head "no" to CP, SOB.  Nods head "yes" to feeling okay.  Inpatient Medications    Scheduled Meds: . Chlorhexidine Gluconate Cloth  6 each Topical Q0600  . Chlorhexidine Gluconate Cloth  6 each Topical Daily  . clopidogrel  75 mg Oral Daily  . insulin aspart  0-6 Units Subcutaneous Q4H  . mupirocin ointment  1 application Nasal BID  . pantoprazole (PROTONIX) IV  40 mg Intravenous Q12H  . vancomycin variable dose per unstable renal function (pharmacist dosing)   Does not apply See admin instructions   Continuous Infusions: . ceFEPime (MAXIPIME) IV Stopped (12/25/20 0849)  . DOPamine 5 mcg/kg/min (12/25/20 0900)  . heparin 600 Units/hr (12/25/20 0900)  . sodium bicarbonate 150 mEq in D5W infusion 75 mL/hr at 12/25/20 0900   PRN Meds: docusate sodium, polyethylene glycol   Vital Signs    Vitals:   12/25/20 0500 12/25/20 0600 12/25/20 0700 12/25/20 0800  BP: (!) 145/86 134/77 125/80 138/79  Pulse: 99   61  Resp: 17 19  12   Temp: 98.96 F (37.2 C) 99.14 F (37.3 C)  99.9 F (37.7 C)  TempSrc:    Core  SpO2: 97%   100%  Weight:      Height:        Intake/Output Summary (Last 24 hours) at 12/25/2020 0906 Last data filed at 12/25/2020 0900 Gross per 24 hour  Intake 1771.8 ml  Output 280 ml  Net 1491.8 ml   Last 3 Weights 12/24/2020 11/25/2019 12/03/2018  Weight (lbs) 122 lb 12.8 oz 130 lb 125 lb  Weight (kg) 55.702 kg 58.968 kg 56.7 kg      Telemetry    Rates 40-low 100s, atrial fibrillation, junctional bradycardia versus atrial fibrillation with slow ventricular rate, pauses up to 2.5 seconds appreciated around 1AM, as well as brief period asystole corresponding to time noted by significant event documentation as in chart -no intervention performed and pt pulse then appreciated  per documentation and consciousness regained.- Personally Reviewed  ECG    No new tracings - Personally Reviewed  Physical Exam   GEN: Frail, cachetic female Neck: No JVD Cardiac: bradycardic and irregular, 1/6 systolic murmur. No rubs or gallops.  Respiratory: Unable to get to breath deeply / poor inspiratory effort, bilateral reduced breath sounds GI: Thin, Soft, nontender, non-distended  MS: No edema; No deformity. Neuro:  Nonfocal  Psych: Normal affect   Labs    High Sensitivity Troponin:   Recent Labs  Lab 12/24/20 1540 12/24/20 1640 12/25/20 0124  TROPONINIHS >27,000* >27,000* >27,000*      Chemistry Recent Labs  Lab 12/24/20 1540 12/25/20 0124 12/25/20 0813  NA 137 144 138  K 4.9 5.3* 5.0  CL 105 103 100  CO2 17* 23 24  GLUCOSE 208* 134* 124*  BUN 74* 71* 78*  CREATININE 3.91* 3.83* 3.96*  CALCIUM 9.0 7.9* 8.2*  PROT 7.2 5.2*  --   ALBUMIN 3.6 2.5*  --   AST 411* 355*  --   ALT 95* 136*  --   ALKPHOS 92 93  --   BILITOT 0.8 0.6  --   GFRNONAA 11* 11* 11*  ANIONGAP 15 18* 14     Hematology Recent Labs  Lab 12/24/20 1540 12/24/20 2155 12/25/20 0124 12/25/20 0425  WBC 26.0*  --  23.5* 19.7*  RBC 3.56*  --  3.42* 3.25*  HGB 11.4* 10.9* 11.1* 10.6*  HCT 35.3* 34.6* 32.4* 31.1*  MCV 99.2  --  94.7 95.7  MCH 32.0  --  32.5 32.6  MCHC 32.3  --  34.3 34.1  RDW 14.2  --  14.0 14.2  PLT 227  --  175 214    BNP Recent Labs  Lab 12/24/20 1540  BNP 3,981.5*     DDimer  Recent Labs  Lab 12/25/20 0419  DDIMER 4.53*     Radiology    CT ABDOMEN PELVIS WO CONTRAST  Result Date: 12/24/2020 CLINICAL DATA:  Recent choking episode EXAM: CT ABDOMEN AND PELVIS WITHOUT CONTRAST TECHNIQUE: Multidetector CT imaging of the abdomen and pelvis was performed following the standard protocol without IV contrast. COMPARISON:  None. FINDINGS: Lower chest: Small bilateral pleural effusions are noted. Patchy airspace opacity is noted in the lower lobes left  greater than right consistent with early infiltrate. Hepatobiliary: No focal liver abnormality is seen. No gallstones, gallbladder wall thickening, or biliary dilatation. Pancreas: Unremarkable. No pancreatic ductal dilatation or surrounding inflammatory changes. Spleen: Normal in size without focal abnormality. Adrenals/Urinary Tract: Adrenal glands are within normal limits. Kidneys demonstrate no renal calculi or obstructive changes. The bladder is partially distended despite Foley catheter placement Stomach/Bowel: No obstructive or inflammatory changes of the colon are seen. The appendix is not well visualized consistent with a prior surgical history. Small bowel is unremarkable. Moderate-sized hiatal hernia is noted. Vascular/Lymphatic: Aortic atherosclerosis. No enlarged abdominal or pelvic lymph nodes. Reproductive: Status post hysterectomy. No adnexal masses. Other: No abdominal wall hernia or abnormality. Mild free pelvic fluid is noted. Musculoskeletal: No acute or significant osseous findings. Degenerative changes and scoliosis concave to the left in the lumbar spine are seen IMPRESSION: Bilateral pleural effusions with patchy basilar airspace opacity consistent with early infiltrate. Mild free pelvic fluid of uncertain significance. Electronically Signed   By: Alcide CleverMark  Lukens M.D.   On: 12/24/2020 22:41   CT Head Wo Contrast  Result Date: 12/24/2020 CLINICAL DATA:  Mental status change, slurred speech, lethargy, dementia EXAM: CT HEAD WITHOUT CONTRAST TECHNIQUE: Contiguous axial images were obtained from the base of the skull through the vertex without intravenous contrast. COMPARISON:  11/25/2019 FINDINGS: Brain: Stable atrophy and chronic white matter microvascular ischemic changes throughout both cerebral hemispheres. Remote bilateral basal ganglia lacunar type infarcts with ex vacuo dilatation of the right lateral ventricle, unchanged. Stable mild ventricular enlargement. No acute intracranial  hemorrhage, new mass lesion, definite acute infarction, midline shift, or extra-axial fluid collection. No focal mass effect or edema. Cisterns are patent. Cerebellar atrophy as well. 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 No hyperdense vessel. Skull: Remote left craniotomy.  Mastoids are clear. Sinuses/Orbits: No acute finding. Other: None. IMPRESSION: Stable atrophy, chronic white matter microvascular ischemic changes, and remote basal ganglia lacunar type infarcts. No interval change or acute intracranial abnormality by noncontrast CT. Electronically Signed   By: Judie PetitM.  Shick M.D.   On: 12/24/2020 16:19   US RENAL  Result Date: 12/25/2020 CLINICAL DATA:  Acute kidney injury. EXAM: RENAL / URINARY TRACT ULTRASOUND COMPLETE COMPARISON:  None. FINDINGS: Right Kidney: Renal measurements: 9.9 x 4.6 x 4.3 cm = volume: 103 mL. RIGHT renal cortex is echogenic. No mass or hydronephrosis visualized. Left Kidney: Not seen due to overlying bowel gas. Bladder: Decompressed by Foley catheter. Other: None. IMPRESSION: 1. RIGHT kidney is echogenic indicating chronic medical renal disease. No RIGHT-sided hydronephrosis. 2. LEFT kidney  not visualized. 3. Bladder decompressed by Foley catheter. Electronically Signed   By: Bary Richard M.D.   On: 12/25/2020 09:01   DG Chest Port 1 View  Result Date: 12/25/2020 CLINICAL DATA:  Check central line placement EXAM: PORTABLE CHEST 1 VIEW COMPARISON:  12/24/2020 FINDINGS: Cardiac shadow is within normal limits. New right jugular central line is noted at the cavoatrial junction. Aortic calcifications are again seen and stable. Lungs are clear bilaterally. Mild vascular congestion remains. No pneumothorax is noted. IMPRESSION: Mild vascular congestion. Right jugular central line in satisfactory position without pneumothorax. Electronically Signed   By: Alcide Clever M.D.   On: 12/25/2020 02:21   DG Chest Portable 1 View  Result Date: 12/24/2020 CLINICAL DATA:   Tachypnea, choking episode yesterday EXAM: PORTABLE CHEST 1 VIEW COMPARISON:  11/25/2019 FINDINGS: Single frontal view of the chest demonstrates stable enlarged cardiac silhouette. There is increased vascular congestion, with minimal right basilar consolidation. No large effusion or pneumothorax. Lung volumes are diminished. IMPRESSION: 1. Increased central vascular congestion, with developing right basilar consolidation. This could reflect mild pulmonary edema, though aspiration could be considered given history of choking episode yesterday. Electronically Signed   By: Sharlet Salina M.D.   On: 12/24/2020 15:59   US Abdomen Limited RUQ (LIVER/GB)  Result Date: 12/24/2020 CLINICAL DATA:  Transaminitis EXAM: ULTRASOUND ABDOMEN LIMITED RIGHT UPPER QUADRANT COMPARISON:  None. FINDINGS: Gallbladder: Markedly thickened gallbladder wall measuring up to 7 mm in thickness. No stones or sonographic Murphy sign. Common bile duct: Diameter: Normal caliber, 3 mm. Liver: Increased echotexture compatible with fatty infiltration. No focal abnormality or biliary ductal dilatation. Portal vein is patent on color Doppler imaging with normal direction of blood flow towards the liver. Other: None. IMPRESSION: Hepatic steatosis. Markedly thickened gallbladder wall without visible stones. This could be related to liver disease. Electronically Signed   By: Charlett Nose M.D.   On: 12/24/2020 20:18    Cardiac Studies   Echo pending 12/25/20  Echo  05/2017 Left ventricle: The cavity size was normal. There was mild  concentric hypertrophy. Systolic function was normal. The  estimated ejection fraction was in the range of 55% to 60%. Wall  motion was normal; there were no regional wall motion  abnormalities. The study is not technically sufficient to allow  evaluation of LV diastolic function.  - Aortic valve: There was trivial regurgitation.  - Mitral valve: There was mild regurgitation.  - Left atrium: The  atrium was moderately dilated.  - Pulmonary arteries: Systolic pressure was mildly increased. PA  peak pressure: 40 mm Hg (S).   Patient Profile     80 y.o. female with history of longstanding bradycardia with sinus node dysfunction, multiple strokes with left hemiparesis, hypertension, and who is being seen today for evaluation of inferior STEMI.   Assessment & Plan    Inferior STEMI --No CP or SOB. Suspcion is that cardiac event started 3 days agao with pt n/v and presenting with HS Tn >27K in ED 12/24/20. Initial EKG with inferior ST elevation III-avF, though borderline. Subsequent EKG with marked ST elevations, consistent with inferior STEMI with posterior changes. Overnight, code blue called for asystole noted on monitor, but no intervention performed as pulse then appreciated on patient with pt documented as regaining consciousness and as indicated in documentation above and 1:13AM note / significant event. EP to round on today. --Pt has indicated she does not wish to undergo invasive procedures, including LHC. --She is not candidate for LHC/PCI given comorbid conditions /  multiorgan dysfunction, including AKI, lactic acidosis, leukocytosis, transaminitis, and positive FOBT/GIB with subsequent need to discontinue IV heparin.  --Risks of intervention outweigh that of benefit. She would be at significant risk for worsening renal failure with contrast, becoming dependent on HD. Anticoagulation would also be required if PCI, and it is currently contraindicated with suspected GIB.   No IV heparin/DAPT given +FOBT/ GIB.  Echo completed, pending results.  Avoid AV nodal blocking agents and nitrates given bradycardia and earlier low BP.  Continue statin.  Recommend supportive care.   Prognosis guarded.  Bradycardia  --Longstanding and stable. Lethargy and slow speech not thought 2/2 bradycardia. Avoid rate lowering agents.  Continue to monitor on telemetry.  Slurred speech with history  of stokes --Slurred speech and L sided weakness not new. CTA without acute intracranial abnormality. Continue telemetry monitoring. As above, DAPT discontinued for + FOBT and reported melena / GIB.  Hypertension --BP borderline low.   GIB --Per IM, GI.   For questions or updates, please contact [facility] Please consult [email or web address] for contact info under        Signed, Lennon Alstrom, PA-C  12/25/2020, 9:06 AM      Cardiology Attending  Patient seen and examined. ECG's reviewed. She has sustained an inferior MI and remains hypotensive and critically ill. She is not a candidate for PCI and she presented with inferior Q's and ST elevation. She does not have angina. She is being supported with IV dopamine. She is on anti-biotics and supportive care. Her prognosis is very poor with over 50% in hospital mortality. Consider hospice/palliative care.  Sharlot Gowda Brack Shaddock,MD

## 2020-12-26 DIAGNOSIS — R001 Bradycardia, unspecified: Secondary | ICD-10-CM | POA: Diagnosis not present

## 2020-12-26 DIAGNOSIS — I214 Non-ST elevation (NSTEMI) myocardial infarction: Secondary | ICD-10-CM | POA: Diagnosis not present

## 2020-12-26 DIAGNOSIS — N179 Acute kidney failure, unspecified: Secondary | ICD-10-CM | POA: Diagnosis not present

## 2020-12-26 DIAGNOSIS — A419 Sepsis, unspecified organism: Secondary | ICD-10-CM | POA: Diagnosis not present

## 2020-12-26 LAB — HEPARIN LEVEL (UNFRACTIONATED)
Heparin Unfractionated: 0.18 IU/mL — ABNORMAL LOW (ref 0.30–0.70)
Heparin Unfractionated: 0.43 IU/mL (ref 0.30–0.70)
Heparin Unfractionated: 0.33 IU/mL (ref 0.30–0.70)

## 2020-12-26 LAB — GLUCOSE, CAPILLARY
Glucose-Capillary: 157 mg/dL — ABNORMAL HIGH (ref 70–99)
Glucose-Capillary: 113 mg/dL — ABNORMAL HIGH (ref 70–99)
Glucose-Capillary: 161 mg/dL — ABNORMAL HIGH (ref 70–99)
Glucose-Capillary: 124 mg/dL — ABNORMAL HIGH (ref 70–99)
Glucose-Capillary: 150 mg/dL — ABNORMAL HIGH (ref 70–99)
Glucose-Capillary: 135 mg/dL — ABNORMAL HIGH (ref 70–99)

## 2020-12-26 LAB — CBC
HCT: 29.6 % — ABNORMAL LOW (ref 36.0–46.0)
HCT: 29.9 % — ABNORMAL LOW (ref 36.0–46.0)
Hemoglobin: 10.1 g/dL — ABNORMAL LOW (ref 12.0–15.0)
Hemoglobin: 10 g/dL — ABNORMAL LOW (ref 12.0–15.0)
MCH: 32.2 pg (ref 26.0–34.0)
MCH: 31.9 pg (ref 26.0–34.0)
MCHC: 34.1 g/dL (ref 30.0–36.0)
MCHC: 33.4 g/dL (ref 30.0–36.0)
MCV: 94.3 fL (ref 80.0–100.0)
MCV: 95.5 fL (ref 80.0–100.0)
Platelets: 187 10*3/uL (ref 150–400)
Platelets: 178 10*3/uL (ref 150–400)
RBC: 3.14 MIL/uL — ABNORMAL LOW (ref 3.87–5.11)
RBC: 3.13 MIL/uL — ABNORMAL LOW (ref 3.87–5.11)
RDW: 14 % (ref 11.5–15.5)
RDW: 13.9 % (ref 11.5–15.5)
WBC: 16.4 10*3/uL — ABNORMAL HIGH (ref 4.0–10.5)
WBC: 15.1 10*3/uL — ABNORMAL HIGH (ref 4.0–10.5)
nRBC: 0.1 % (ref 0.0–0.2)
nRBC: 0 % (ref 0.0–0.2)

## 2020-12-26 LAB — TROPONIN I (HIGH SENSITIVITY)
Troponin I (High Sensitivity): >27000 ng/L (ref <18)
Troponin I (High Sensitivity): >27000 ng/L (ref <18)

## 2020-12-26 LAB — HEPATITIS PANEL, ACUTE
HCV Ab: NONREACTIVE
Hep A IgM: NONREACTIVE
Hep B C IgM: NONREACTIVE
Hepatitis B Surface Ag: NONREACTIVE

## 2020-12-26 LAB — COMPREHENSIVE METABOLIC PANEL
ALT: 103 U/L — ABNORMAL HIGH (ref 0–44)
AST: 183 U/L — ABNORMAL HIGH (ref 15–41)
Albumin: 2.7 g/dL — ABNORMAL LOW (ref 3.5–5.0)
Alkaline Phosphatase: 78 U/L (ref 38–126)
Anion gap: 13 (ref 5–15)
BUN: 89 mg/dL — ABNORMAL HIGH (ref 8–23)
CO2: 26 mmol/L (ref 22–32)
Calcium: 7.9 mg/dL — ABNORMAL LOW (ref 8.9–10.3)
Chloride: 101 mmol/L (ref 98–111)
Creatinine, Ser: 3.96 mg/dL — ABNORMAL HIGH (ref 0.44–1.00)
GFR, Estimated: 11 mL/min — ABNORMAL LOW (ref >60)
Glucose, Bld: 140 mg/dL — ABNORMAL HIGH (ref 70–99)
Potassium: 4.2 mmol/L (ref 3.5–5.1)
Sodium: 140 mmol/L (ref 135–145)
Total Bilirubin: 0.8 mg/dL (ref 0.3–1.2)
Total Protein: 5.8 g/dL — ABNORMAL LOW (ref 6.5–8.1)

## 2020-12-26 LAB — MAGNESIUM: Magnesium: 2.3 mg/dL (ref 1.7–2.4)

## 2020-12-26 LAB — HEMOGLOBIN AND HEMATOCRIT, BLOOD
HCT: 28.3 % — ABNORMAL LOW (ref 36.0–46.0)
HCT: 30.7 % — ABNORMAL LOW (ref 36.0–46.0)
Hemoglobin: 9.5 g/dL — ABNORMAL LOW (ref 12.0–15.0)
Hemoglobin: 10 g/dL — ABNORMAL LOW (ref 12.0–15.0)

## 2020-12-26 LAB — COOXEMETRY PANEL
Carboxyhemoglobin: 1.3 % (ref 0.5–1.5)
Methemoglobin: 0.6 % (ref 0.0–1.5)

## 2020-12-26 LAB — PHOSPHORUS: Phosphorus: 5.9 mg/dL — ABNORMAL HIGH (ref 2.5–4.6)

## 2020-12-26 LAB — VANCOMYCIN, RANDOM: Vancomycin Rm: 13

## 2020-12-26 LAB — LACTIC ACID, PLASMA: Lactic Acid, Venous: 2.1 mmol/L (ref 0.5–1.9)

## 2020-12-26 MED ORDER — FUROSEMIDE 10 MG/ML IJ SOLN
100.0000 mg | Freq: Once | INTRAVENOUS | Status: AC
  Administered 2020-12-26: 100 mg via INTRAVENOUS
  Filled 2020-12-26: qty 10

## 2020-12-26 MED ORDER — FUROSEMIDE 10 MG/ML IJ SOLN
80.0000 mg | Freq: Once | INTRAMUSCULAR | Status: DC
  Filled 2020-12-26: qty 8

## 2020-12-26 MED ORDER — SODIUM CHLORIDE 0.9 % IV SOLN
INTRAVENOUS | Status: DC | PRN
  Administered 2020-12-26: 250 mL via INTRAVENOUS

## 2020-12-26 MED ORDER — HEPARIN BOLUS VIA INFUSION
1500.0000 [IU] | Freq: Once | INTRAVENOUS | Status: AC
  Administered 2020-12-26: 1500 [IU] via INTRAVENOUS
  Filled 2020-12-26: qty 1500

## 2020-12-26 MED ORDER — PANTOPRAZOLE SODIUM 40 MG PO TBEC
40.0000 mg | DELAYED_RELEASE_TABLET | Freq: Every day | ORAL | Status: DC
  Administered 2020-12-27 – 2020-12-28 (×2): 40 mg via ORAL
  Filled 2020-12-26 (×2): qty 1

## 2020-12-26 MED ORDER — PANTOPRAZOLE SODIUM 40 MG PO TBEC: 40.0000 mg | DELAYED_RELEASE_TABLET | Freq: Every day | ORAL | Status: DC

## 2020-12-26 MED ORDER — VANCOMYCIN HCL 500 MG/100ML IV SOLN
500.0000 mg | Freq: Once | INTRAVENOUS | Status: AC
  Administered 2020-12-26: 500 mg via INTRAVENOUS
  Filled 2020-12-26: qty 100

## 2020-12-26 NOTE — Progress Notes (Signed)
Patient heart monitor repeatedly alerted for ST elevation. Obtained an EKG and provided EKG to Dr.Schertz. Dr.Schertz stated that there was no real change when comparing yesterdays (4/16) EKG with today's (4/17) EKG. No new orders given at this time. Placed EKG in patients chart. Will continue patient care.

## 2020-12-26 NOTE — Progress Notes (Addendum)
Progress Note  Patient Name: Joanna Stone Date of Encounter: 12/26/2020  Primary Cardiologist: [facility], Dr. Okey Dupre  Subjective   No CP.  Reports SOB with work of breathing noted. On [place] oxygen with central line. Wants to smoke her marijuana.  "I am hungry."  Inpatient Medications    Scheduled Meds: . Chlorhexidine Gluconate Cloth  6 each Topical Q0600  . Chlorhexidine Gluconate Cloth  6 each Topical Daily  . clopidogrel  75 mg Oral Daily  . heparin  1,500 Units Intravenous Once  . insulin aspart  0-6 Units Subcutaneous Q4H  . mupirocin ointment  1 application Nasal BID  . pantoprazole (PROTONIX) IV  40 mg Intravenous Q12H  . vancomycin variable dose per unstable renal function (pharmacist dosing)   Does not apply See admin instructions   Continuous Infusions: . ceFEPime (MAXIPIME) IV Stopped (12/25/20 0849)  . DOPamine 5 mcg/kg/min (12/25/20 1900)  . heparin 1,050 Units/hr (12/26/20 0648)   PRN Meds: acetaminophen **OR** acetaminophen, docusate sodium, polyethylene glycol   Vital Signs    Vitals:   12/25/20 2000 12/25/20 2100 12/25/20 2200 12/25/20 2300  BP: 114/72 114/68 108/70 118/77  Pulse: 66 (!) 31 (!) 59 65  Resp: (!) 25 (!) 28 (!) 31 18  Temp: (!) 100.8 F (38.2 C) (!) 100.8 F (38.2 C) (!) 100.9 F (38.3 C) 99.9 F (37.7 C)  TempSrc:      SpO2: 94% 91% 94% 97%  Weight:      Height:        Intake/Output Summary (Last 24 hours) at 12/26/2020 0741 Last data filed at 12/26/2020 0600 Gross per 24 hour  Intake 1012 ml  Output 940 ml  Net 72 ml   Last 3 Weights 12/24/2020 11/25/2019 12/03/2018  Weight (lbs) 122 lb 12.8 oz 130 lb 125 lb  Weight (kg) 55.702 kg 58.968 kg 56.7 kg      Telemetry    Junctional rhythm versus afib, rates currently 80s.- Personally Reviewed  ECG    No new tracings - Personally Reviewed  Physical Exam   GEN: Frail, cachetic female Neck: Central line present. Cardiac: IRIR, 3/6 systolic murmur. No rubs or gallops.   Respiratory: bilateral reduced breath sounds. Work of breathing noted on [place] O2. GI: Soft, nontender, non-distended  MS: No edema; No deformity. Neuro:  Nonfocal  Psych: Normal affect   Labs    High Sensitivity Troponin:   Recent Labs  Lab 12/24/20 1540 12/24/20 1640 12/25/20 0124  TROPONINIHS >27,000* >27,000* >27,000*      Chemistry Recent Labs  Lab 12/24/20 1540 12/25/20 0124 12/25/20 0813 12/25/20 1019 12/26/20 0457  NA 137 144 138 139 140  K 4.9 5.3* 5.0 4.9 4.2  CL 105 103 100 100 101  CO2 17* 23 24 24 26   GLUCOSE 208* 134* 124* 152* 140*  BUN 74* 71* 78* 83* 89*  CREATININE 3.91* 3.83* 3.96* 4.08* 3.96*  CALCIUM 9.0 7.9* 8.2* 8.1* 7.9*  PROT 7.2 5.2*  --   --  5.8*  ALBUMIN 3.6 2.5*  --   --  2.7*  AST 411* 355*  --   --  183*  ALT 95* 136*  --   --  103*  ALKPHOS 92 93  --   --  78  BILITOT 0.8 0.6  --   --  0.8  GFRNONAA 11* 11* 11* 11* 11*  ANIONGAP 15 18* 14 15 13      Hematology Recent Labs  Lab 12/25/20 0425 12/25/20 1757 12/26/20  0228 12/26/20 0457  WBC 19.7* 17.0*  --  16.4*  RBC 3.25* 3.05*  --  3.14*  HGB 10.6* 9.7* 9.5* 10.1*  HCT 31.1* 29.1* 28.3* 29.6*  MCV 95.7 95.4  --  94.3  MCH 32.6 31.8  --  32.2  MCHC 34.1 33.3  --  34.1  RDW 14.2 13.9  --  14.0  PLT 214 198  --  187    BNP Recent Labs  Lab 12/24/20 1540  BNP 3,981.5*     DDimer  Recent Labs  Lab 12/25/20 0419  DDIMER 4.53*     Radiology    CT ABDOMEN PELVIS WO CONTRAST  Result Date: 12/24/2020 CLINICAL DATA:  Recent choking episode EXAM: CT ABDOMEN AND PELVIS WITHOUT CONTRAST TECHNIQUE: Multidetector CT imaging of the abdomen and pelvis was performed following the standard protocol without IV contrast. COMPARISON:  None. FINDINGS: Lower chest: Small bilateral pleural effusions are noted. Patchy airspace opacity is noted in the lower lobes left greater than right consistent with early infiltrate. Hepatobiliary: No focal liver abnormality is seen. No gallstones,  gallbladder wall thickening, or biliary dilatation. Pancreas: Unremarkable. No pancreatic ductal dilatation or surrounding inflammatory changes. Spleen: Normal in size without focal abnormality. Adrenals/Urinary Tract: Adrenal glands are within normal limits. Kidneys demonstrate no renal calculi or obstructive changes. The bladder is partially distended despite Foley catheter placement Stomach/Bowel: No obstructive or inflammatory changes of the colon are seen. The appendix is not well visualized consistent with a prior surgical history. Small bowel is unremarkable. Moderate-sized hiatal hernia is noted. Vascular/Lymphatic: Aortic atherosclerosis. No enlarged abdominal or pelvic lymph nodes. Reproductive: Status post hysterectomy. No adnexal masses. Other: No abdominal wall hernia or abnormality. Mild free pelvic fluid is noted. Musculoskeletal: No acute or significant osseous findings. Degenerative changes and scoliosis concave to the left in the lumbar spine are seen IMPRESSION: Bilateral pleural effusions with patchy basilar airspace opacity consistent with early infiltrate. Mild free pelvic fluid of uncertain significance. Electronically Signed   By: Alcide Clever M.D.   On: 12/24/2020 22:41   CT Head Wo Contrast  Result Date: 12/24/2020 CLINICAL DATA:  Mental status change, slurred speech, lethargy, dementia EXAM: CT HEAD WITHOUT CONTRAST TECHNIQUE: Contiguous axial images were obtained from the base of the skull through the vertex without intravenous contrast. COMPARISON:  11/25/2019 FINDINGS: Brain: Stable atrophy and chronic white matter microvascular ischemic changes throughout both cerebral hemispheres. Remote bilateral basal ganglia lacunar type infarcts with ex vacuo dilatation of the right lateral ventricle, unchanged. Stable mild ventricular enlargement. No acute intracranial hemorrhage, new mass lesion, definite acute infarction, midline shift, or extra-axial fluid collection. No focal mass effect  or edema. Cisterns are patent. Cerebellar atrophy as well. 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 No hyperdense vessel. Skull: Remote left craniotomy.  Mastoids are clear. Sinuses/Orbits: No acute finding. Other: None. IMPRESSION: Stable atrophy, chronic white matter microvascular ischemic changes, and remote basal ganglia lacunar type infarcts. No interval change or acute intracranial abnormality by noncontrast CT. Electronically Signed   By: Judie Petit.  Shick M.D.   On: 12/24/2020 16:19   US RENAL  Result Date: 12/25/2020 CLINICAL DATA:  Acute kidney injury. EXAM: RENAL / URINARY TRACT ULTRASOUND COMPLETE COMPARISON:  None. FINDINGS: Right Kidney: Renal measurements: 9.9 x 4.6 x 4.3 cm = volume: 103 mL. RIGHT renal cortex is echogenic. No mass or hydronephrosis visualized. Left Kidney: Not seen due to overlying bowel gas. Bladder: Decompressed by Foley catheter. Other: None. IMPRESSION: 1. RIGHT kidney is echogenic indicating chronic  medical renal disease. No RIGHT-sided hydronephrosis. 2. LEFT kidney not visualized. 3. Bladder decompressed by Foley catheter. Electronically Signed   By: Bary Richard M.D.   On: 12/25/2020 09:01   DG Chest Port 1 View  Result Date: 12/25/2020 CLINICAL DATA:  Check central line placement EXAM: PORTABLE CHEST 1 VIEW COMPARISON:  12/24/2020 FINDINGS: Cardiac shadow is within normal limits. New right jugular central line is noted at the cavoatrial junction. Aortic calcifications are again seen and stable. Lungs are clear bilaterally. Mild vascular congestion remains. No pneumothorax is noted. IMPRESSION: Mild vascular congestion. Right jugular central line in satisfactory position without pneumothorax. Electronically Signed   By: Alcide Clever M.D.   On: 12/25/2020 02:21   DG Chest Portable 1 View  Result Date: 12/24/2020 CLINICAL DATA:  Tachypnea, choking episode yesterday EXAM: PORTABLE CHEST 1 VIEW COMPARISON:  11/25/2019 FINDINGS: Single frontal view of the  chest demonstrates stable enlarged cardiac silhouette. There is increased vascular congestion, with minimal right basilar consolidation. No large effusion or pneumothorax. Lung volumes are diminished. IMPRESSION: 1. Increased central vascular congestion, with developing right basilar consolidation. This could reflect mild pulmonary edema, though aspiration could be considered given history of choking episode yesterday. Electronically Signed   By: Sharlet Salina M.D.   On: 12/24/2020 15:59   ECHOCARDIOGRAM COMPLETE  Result Date: 12/25/2020    ECHOCARDIOGRAM REPORT   Patient Name:   Joanna Stone Date of Exam: 12/25/2020 Medical Rec #:  [id number]        Height:       66.0 in Accession #:    [id number]       Weight:       122.8 lb Date of Birth:  03-Nov-1940        BSA:          1.625 m Patient Age:    79 years         BP:           134/77 mmHg Patient Gender: F                HR:           62 bpm. Exam Location:  [facility] Procedure: 2D Echo and 3D Echo Indications:     NSTEMI I21.4  History:         Patient has prior history of Echocardiogram examinations, most                  recent 06/04/2017.  Sonographer:     Overton Mam RDCS Referring Phys:  [id number] BRITTON L [place] Diagnosing Phys: Arnoldo Hooker MD IMPRESSIONS  1. Left ventricular ejection fraction, by estimation, is 25 to 30%. The left ventricle has severely decreased function. The left ventricle demonstrates regional wall motion abnormalities (see scoring diagram/findings for description). The left ventricular internal cavity size was mildly dilated. Left ventricular diastolic function could not be evaluated.  2. Right ventricular systolic function is normal. The right ventricular size is normal.  3. Left atrial size was severely dilated.  4. Right atrial size was moderately dilated.  5. The mitral valve is normal in structure. Severe mitral valve regurgitation.  6. Tricuspid valve regurgitation is moderate to severe.  7. The aortic valve is  normal in structure. Aortic valve regurgitation is mild. FINDINGS  Left Ventricle: Left ventricular ejection fraction, by estimation, is 25 to 30%. The left ventricle has severely decreased function. The left ventricle demonstrates regional wall motion abnormalities. Severe akinesis of the left ventricular, entire  inferoseptal wall, inferior wall, lateral wall, inferolateral wall and inferior segment. The left ventricular internal cavity size was mildly dilated. There is no left ventricular hypertrophy. Left ventricular diastolic function could not be evaluated. Right Ventricle: The right ventricular size is normal. No increase in right ventricular wall thickness. Right ventricular systolic function is normal. Left Atrium: Left atrial size was severely dilated. Right Atrium: Right atrial size was moderately dilated. Pericardium: There is no evidence of pericardial effusion. Mitral Valve: The mitral valve is normal in structure. Severe mitral valve regurgitation. Tricuspid Valve: The tricuspid valve is normal in structure. Tricuspid valve regurgitation is moderate to severe. Aortic Valve: The aortic valve is normal in structure. Aortic valve regurgitation is mild. Aortic regurgitation PHT measures 537 msec. Aortic valve peak gradient measures 5.1 mmHg. Pulmonic Valve: The pulmonic valve was normal in structure. Pulmonic valve regurgitation is mild. Aorta: The aortic root and ascending aorta are structurally normal, with no evidence of dilitation. IAS/Shunts: No atrial level shunt detected by color flow Doppler.  LEFT VENTRICLE PLAX 2D LVIDd:         4.97 cm      Diastology LVIDs:         4.40 cm      LV e' medial:    5.33 cm/s LV PW:         1.11 cm      LV E/e' medial:  17.6 LV IVS:        1.21 cm      LV e' lateral:   5.11 cm/s                             LV E/e' lateral: 18.3  LV Volumes (MOD) LV vol d, MOD A2C: 83.5 ml LV vol d, MOD A4C: 101.0 ml LV vol s, MOD A2C: 83.7 ml LV vol s, MOD A4C: 74.1 ml LV SV MOD  A2C:     -0.2 ml LV SV MOD A4C:     101.0 ml LV SV MOD BP:      14.1 ml RIGHT VENTRICLE RV Basal diam:  3.76 cm RV S prime:     6.96 cm/s TAPSE (M-mode): 1.7 cm LEFT ATRIUM              Index       RIGHT ATRIUM           Index LA diam:        5.90 cm  3.63 cm/m  RA Area:     24.10 cm LA Vol (A2C):   104.0 ml 63.98 ml/m RA Volume:   85.70 ml  52.73 ml/m LA Vol (A4C):   128.0 ml 78.75 ml/m LA Biplane Vol: 124.0 ml 76.29 ml/m  AORTIC VALVE              PULMONIC VALVE AV Vmax:      113.00 cm/s PV Vmax:       0.77 m/s AV Peak Grad: 5.1 mmHg    PV Peak grad:  2.4 mmHg LVOT Vmax:    53.20 cm/s LVOT Vmean:   32.000 cm/s LVOT VTI:     0.084 m AI PHT:       537 msec  AORTA Ao Asc diam: 3.00 cm MITRAL VALVE                TRICUSPID VALVE MV Area (PHT): 5.58 cm     TV Peak grad:   34.2 mmHg MV Decel  Time: 136 msec     TV Vmax:        2.92 m/s MR Peak grad:   113.2 mmHg MR Mean grad:   70.0 mmHg   SHUNTS MR Vmax:        532.00 cm/s Systemic VTI: 0.08 m MR Vmean:       388.0 cm/s MR PISA:        2.65 cm MR PISA Radius: 0.65 cm MV E velocity: 93.60 cm/s Arnoldo HookerBruce Kowalski MD Electronically signed by Arnoldo HookerBruce Kowalski MD Signature Date/Time: 12/25/2020/2:59:31 PM    Final    US Abdomen Limited RUQ (LIVER/GB)  Result Date: 12/24/2020 CLINICAL DATA:  Transaminitis EXAM: ULTRASOUND ABDOMEN LIMITED RIGHT UPPER QUADRANT COMPARISON:  None. FINDINGS: Gallbladder: Markedly thickened gallbladder wall measuring up to 7 mm in thickness. No stones or sonographic Murphy sign. Common bile duct: Diameter: Normal caliber, 3 mm. Liver: Increased echotexture compatible with fatty infiltration. No focal abnormality or biliary ductal dilatation. Portal vein is patent on color Doppler imaging with normal direction of blood flow towards the liver. Other: None. IMPRESSION: Hepatic steatosis. Markedly thickened gallbladder wall without visible stones. This could be related to liver disease. Electronically Signed   By: Charlett NoseKevin  Dover M.D.   On:  12/24/2020 20:18    Cardiac Studies   Echo pending 1. Left ventricular ejection fraction, by estimation, is 25 to 30%. The  left ventricle has severely decreased function. The left ventricle  demonstrates regional wall motion abnormalities (see scoring  diagram/findings for description). The left  ventricular internal cavity size was mildly dilated. Left ventricular  diastolic function could not be evaluated.  2. Right ventricular systolic function is normal. The right ventricular  size is normal.  3. Left atrial size was severely dilated.  4. Right atrial size was moderately dilated.  5. The mitral valve is normal in structure. Severe mitral valve  regurgitation.  6. Tricuspid valve regurgitation is moderate to severe.  7. The aortic valve is normal in structure. Aortic valve regurgitation is  mild.   Echo  05/2017 Left ventricle: The cavity size was normal. There was mild  concentric hypertrophy. Systolic function was normal. The  estimated ejection fraction was in the range of 55% to 60%. Wall  motion was normal; there were no regional wall motion  abnormalities. The study is not technically sufficient to allow  evaluation of LV diastolic function.  - Aortic valve: There was trivial regurgitation.  - Mitral valve: There was mild regurgitation.  - Left atrium: The atrium was moderately dilated.  - Pulmonary arteries: Systolic pressure was mildly increased. PA  peak pressure: 40 mm Hg (S).   Patient Profile     80 y.o. female with history of longstanding bradycardia with sinus node dysfunction, multiple strokes with left hemiparesis, hypertension, and who is being seen today for evaluation of inferior STEMI.   Assessment & Plan    Inferior STEMI --No CP. Does report SOB, likely multifactorial. Suspicion is that cardiac event started 3 days ago with pt n/v and presenting with HS Tn >27K in ED 12/24/20. EKG consistent with inferior STEMI. EF severely reduced  on echo with WMA as below. She does not wish to undergo invasive procedures, including LHC. She is also not candidate for LHC/PCI given comorbid conditions / multiorgan dysfunction, including AKI, lactic acidosis, leukocytosis, transaminitis, and positive FOBT/GIB with subsequent need to discontinue IV heparin. Risks of intervention outweigh that of benefit.    Primary team has restarted IV heparin and Plavix  Positive  FOBT documented 12/25/20 with recommendation as per earlier notes for restart of Heparin and antiplatelets when felt safe to do so from a bleeding standpoint.   Daily CBC. Hgb 9.5, Hct 28.3. Monitor closely.   No BB given earlier bradycardia and hypotension on dopamine drip.   Continue dopamine drip.  Not on a statin - likely discontinued for earlier transaminitis.  Prognosis guarded. Consider hospice, palliative care.   Newly diagnosed HFrEF, EF 25-30% Severe mitral regurgitation, moderate to severe TR Dilated cardiomyopathy --SOB and work of breathing. SOB likely multifactorial as above given comorbids. Echo yesterday 4/16 with EF severely reduced 25-30% and with regional wall motion abnormalities. Also noted is LVE, severe LAE, moderate RAE, severe MR, moderate to severe TR, mild AR.   Currently on IV diuresis per primary team.   Could consider nephrology consult.   Cr 4.08  3.96 with BUN 83  89.   Replete electrolytes with goal K 4.0, Mg 2.0.  Continue to monitor CVP via central line.  Overall prognosis poor.  Unable to escalate GDMT due to renal function.   No ACE/ARB/ARNI/MRA/SGLT2i recommended.  Bradycardia  --Rates currently in the 80s. Longstanding history of bradycardia per notes. Lethargy and slow speech not thought 2/2 bradycardia. Avoid rate lowering agents.  Continue to monitor on telemetry. Continue dopamine drip.  Slurred speech with history of stokes --Slurred speech and L sided weakness not new. CTA without acute intracranial abnormality.  Continue telemetry monitoring. As above, DAPT discontinued earlier for + FOBT and reported melena / GIB. It has since been restarted per primary team.   Hypertension --BP soft on dopamine drip. Continue to monitor.  GIB --Positive FOBT this weekend. Antiplatelet therapy and heparin has been restarted per primary team. Recommend discontinuing antiplatelet therapy and heparin if suspected bleeding again with close monitoring of H&H.   AOCKD --Daily BMET. Most recent Cr 4.08  3.96 with BUN 83  89. Consider nephrology consult.   Septic shock --Continue abx.   For questions or updates, please contact [facility] Please consult [email or web address] for contact info under   Signed, Lennon Alstrom, PA-C  12/26/2020, 7:41 AM      Cardiology Attending  Remains critically ill but improving. Seen and examined. I agree with the findings as noted above. The patient is a little better this morning. More alert and bp improved. She remains critically ill and her in hospital mortality is very high. She is at risk for multiple MI complications. Continue supportive care. No plans for invasive treatment. Wean dopamine as you are able. Ok to feed.   Sharlot Gowda Vester Balthazor,MD

## 2020-12-26 NOTE — Consult Note (Signed)
Pharmacy Antibiotic Note  Joanna Stone is a 80 y.o. female admitted on 12/24/2020 with sepsis. Pt presented with slurred speech and lethargy, last known well time unknown. PMH includes bradycardia, dementia, stroke, seizures, HTN, CKD, and CVA. EKG concerning for STEMI. Pt choked prior to admission, concern for aspiration pneumonia and possible UTI. Pharmacy has been consulted for cefepime and vancomycin dosing. Pt received vancomycin 1250 mg x 1 loading dose on 4/15 20:38. There is no improvement in SCr (unclear baseline) or BUN w/ UPO 940 mL/24h. Random vancomycin level is 13 mcg/mL following the aforementioned dose.  Plan:  1) given poor renal function continue to dose vancomycin based on levels  vancomycin 500 mg IV x 1  Ke: 0.013 h-1  T1/2: 52.9 h  vancomycin level with 4/18 am labs  2) continue cefepime 1 gram IV every 24 hours  Height: 5\' 6"  (167.6 cm) Weight: 55.7 kg (122 lb 12.8 oz) IBW/kg (Calculated) : 59.3  Temp (24hrs), Avg:100.7 F (38.2 C), Min:99.9 F (37.7 C), Max:101.1 F (38.4 C)  Recent Labs  Lab 12/24/20 1540 12/24/20 2024 12/25/20 0124 12/25/20 0425 12/25/20 0813 12/25/20 1000 12/25/20 1019 12/25/20 1400 12/25/20 1657 12/25/20 1757 12/26/20 0457  WBC 26.0*  --  23.5* 19.7*  --   --   --   --   --  17.0* 16.4*  CREATININE 3.91*  --  3.83*  --  3.96*  --  4.08*  --   --   --  3.96*  LATICACIDVEN  --  9.9* 6.9*  --   --  2.3*  --  2.3* 2.3*  --   --   VANCORANDOM  --   --   --   --   --   --   --   --   --   --  13    Estimated Creatinine Clearance: 10.1 mL/min (A) (by C-G formula based on SCr of 3.96 mg/dL (H)).    Allergies  Allergen Reactions  . Ultram [Tramadol]     vomiting    Antimicrobials this admission: 4/15 Unasyn >> 4/16 4/16 cefepime >> 4/15 vancomycin >>  Microbiology results: 4/15 BCx: NG x 1 day 4/15 UCx: ordered 4/15 MRSA PCR + 4/15 SARS CoV-2: negative 4/15 influenza A/B: negative  Thank you for allowing pharmacy  to be a part of this patient's care.  5/15, PharmD 12/26/2020 9:14 AM

## 2020-12-26 NOTE — Consult Note (Addendum)
ANTICOAGULATION CONSULT NOTE  Pharmacy Consult for heparin Indication: chest pain/ACS  Patient Measurements: Height: 5\' 6"  (167.6 cm) Weight: 55.7 kg (122 lb 12.8 oz) IBW/kg (Calculated) : 59.3 Heparin Dosing Weight: 55.7  Vital Signs: Temp: 99.9 F (37.7 C) (04/16 2300) BP: 118/77 (04/16 2300) Pulse Rate: 65 (04/16 2300)  Labs: Recent Labs    12/24/20 1540 12/24/20 1640 12/24/20 1929 12/24/20 2155 12/25/20 0124 12/25/20 0419 12/25/20 0425 12/25/20 0813 12/25/20 1019 12/25/20 1757 12/26/20 0228 12/26/20 0457 12/26/20 0506  HGB 11.4*  --   --    < > 11.1*  --  10.6*  --   --  9.7* 9.5* 10.1*  --   HCT 35.3*  --   --    < > 32.4*  --  31.1*  --   --  29.1* 28.3* 29.6*  --   PLT 227  --   --   --  175  --  214  --   --  198  --  187  --   APTT  --   --  34  --   --  36  --   --   --   --   --   --   --   LABPROT  --   --  16.5*  --   --  17.2*  --   --   --   --   --   --   --   INR  --   --  1.3*  --   --  1.4*  --   --   --   --   --   --   --   HEPARINUNFRC  --   --   --   --   --   --   --   --   --  0.11*  --   --  0.18*  CREATININE 3.91*  --   --   --  3.83*  --   --  3.96* 4.08*  --   --  3.96*  --   TROPONINIHS >27,000* >27,000*  --   --  >27,000*  --   --   --   --   --   --   --   --    < > = values in this interval not displayed.    Estimated Creatinine Clearance: 10.1 mL/min (A) (by C-G formula based on SCr of 3.96 mg/dL (H)).   Medications:  No anticoagulants prior to admission per chart review   Assessment: 80 yo F presented with slurred speech and lethargy, last known well time unknown. PMH includes bradycardia, dementia, stroke, seizures, HTN, CKD, and CVA. EKG concerning for STEMI. Pharmacy consulted to dose heparin for ACS/STEMI. Heparin was stopped due to hematochezia but given elevated cardiac risk it is being restarted. HS Tn elevation  Date Time HL Rate/comment 4/16 1757 0.11 600 units/hr - subtherapeutic 4/17 0506 0.18   Goal of Therapy:   Heparin level 0.3-0.5 units/ml Monitor platelets by anticoagulation protocol: Yes   Plan:  Heparin level is subtherapeutic. Will give heparin bolus 1500 units and increase heparin infusion rate to 1050 units/hr. Recheck heparin level in 8 hours. CBC daily while on heparin. Monitor Hgb and Plt. Plan to aim for a goal of 0.3 to 0.5 due to issue of bleeding.   5/17, PharmD 12/26/2020 6:40 AM

## 2020-12-26 NOTE — Progress Notes (Signed)
NAME:  Joanna Stone, MRN:  [id number], DOB:  1940/12/10, LOS: 2 ADMISSION DATE:  12/24/2020, CONSULTATION DATE:  12/24/2020 REFERRING MD:  Dr. Adaline Sill, CHIEF COMPLAINT:   Fatigue & Aphasia  History of Present Illness:  Ms.Burdette is a 80 y.o. female from SNF here for inferior STEMI (medically managed), septic shock 2/2 suspected CAP, profound lactic acidosis, AKI and elevated LFTs. Troponin and BNP significantly elevated. EKG in last 24 hours shows evolution of inferior STEMI. She required low-dose dopamine with improvement in heart rate and hypotension. Lactic acid peaked at 9. Blood gas showed respiratory compensation. Procalcitonin elevated at 4.9. Broad spectrum antibiotics initiated in ED. Imaging obtained includes CXR suggesting L>R bibasilar opacities and cardiomegaly, RUQ abd suggesting fatty liver and CT a/p showing small amount of free pelvic fluid without evidence for rupture or perforation. U/A shows moderate leuks, many bacteria and mucus. Echocardiogram completed showing EF of 25% with wall motion abnormalities.   ED course: Per ED documentation patient reportedly more confused and lethargic, sleeping later today than usual and with a possible choking episode on 12/23/2020.  The patient was initially presented as a possible STEMI alert and was evaluated by Dr. Okey Dupre with [facility] cardiology. Supervisor at the SNF reported to Dr. Okey Dupre that the patient had an episode of emesis 2 days ago but was otherwise in her normal state of health until the morning of 12/24/2020 at which point she was more lethargic and her speech seemed softer and slurred.  EMS was called at which point EKG was notable for bradycardia with possible inferior ST elevation.  However upon arrival to [facility] ED the patient was asymptomatic denying chest pain, shortness of breath, nausea or lightheadedness.  She notes chronic weakness on her left side from a previous stroke but does not feel this is different than usual.  Patient has  also been bradycardic with marginal blood pressures, however she has chronic bradycardia in the setting of sinus node dysfunction. The decision was made not to perform a cardiac cath at this time, and due to a heparin drip was initiated per pharmacy protocol.  Significant labs: Troponin elevated > 27,000 x 2, BNP elevated at 3981.5, Transaminitis: AST 411, ALT 95, lactic acidosis 6.1, leukocytosis 26, AKI with BUN/Cr: 74/3.91, serum CO2 17.  UA consistent with UTI: + Leukocytes, + many bacteria, > 50 WBCs.  Chest x-ray consistent with developing RLL pneumonia & mild pulmonary edema versus aspiration.  PCCM consulted for admission due to patient's complex picture. 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 4/15 Admit to SDU for monitoring on heparin drip with N-STEMI & Sepsis . 4/16 remained on dopamine for cardiogenic and septic shock; urine cxs positive for E coli  Interim History / Subjective:  She remains on dopamine 5 mcg/min. She has no new complaints this morning. Leukocytosis is downtrending. TTE yesterday showed EF 25% with wall motion abnormalities. UOP 910 last 24 hours with Lasix x2. Creatinine stable around 3.8-4.0. EKG shows evolution of inferior STEMI. Tmax 101.1F overnight.  Objective   Blood pressure 104/65, pulse 68, temperature 100.22 F (37.9 C), resp. rate (!) 27, height 5\' 6"  (1.676 m), weight 55.7 kg, SpO2 100 %. CVP:  [0 mmHg-18 mmHg] 18 mmHg      Intake/Output Summary (Last 24 hours) at 12/26/2020 1206  Last data filed at 12/26/2020 0600 Gross per 24 hour  Intake 438.1 ml  Output 910 ml  Net -471.9 ml   Filed Weights   12/24/20 1503  Weight: 55.7 kg    Examination: General: Adult female, frail appearing, lying in bed, NAD HEENT: MM  pink/dry, anicteric, atraumatic, neck supple CV: s1s2 RRR, NSR-SB on monitor, no r/m/g Pulm: CTA b/l, no W/C/R, mildly tachypneic GI: soft, flat, non tender, bs x 4 Skin: no rashes/lesions noted Extremities: warm/dry, pulses + 2 R/P, no edema noted  Labs/imaging that I have personally reviewed  (right click and "Reselect all SmartList Selections" daily)  4/16 TTE 1. Left ventricular ejection fraction, by estimation, is 25 to 30%. The  left ventricle has severely decreased function. The left ventricle  demonstrates regional wall motion abnormalities (see scoring  diagram/findings for description). The left  ventricular internal cavity size was mildly dilated. Left ventricular  diastolic function could not be evaluated.  2. Right ventricular systolic function is normal. The right ventricular  size is normal.  3. Left atrial size was severely dilated.  4. Right atrial size was moderately dilated.  5. The mitral valve is normal in structure. Severe mitral valve  regurgitation.  6. Tricuspid valve regurgitation is moderate to severe.  7. The aortic valve is normal in structure. Aortic valve regurgitation is  mild.   12/24/20 CT head w/o contrast >> stable atrophy chronic white matter microvascular ischemic changes and remote basal ganglia lacunar type infarcts.  No acute intracranial abnormality.  12/24/20 RUQ [place] >> solitary RIGHT kidney with evidence of chronic renal disease, no hydronephrosis  EKG Interpretation Date: 12/24/20 EKG Time: 15:08 Rate: 55 Rhythm: A-fib vs junctional rhythm (baseline wander of leads make read challenging) QRS Axis:  possible LAD Intervals: unable to calculate PR ST/T Wave abnormalities: possible STE in lead III and ST depression in V1/ V2 Narrative Interpretation: possible A-fib without clear ischemic features.  CXR 12/24/2020: Increased central vascular congestion with developing right basilar consolidation.  Possibly mild pulmonary edema versus  aspiration Resolved Hospital Problem list   N/A  Assessment & Plan:   Inferior STEMI with rate and rhythm disturbances, medically managed  PMHx: Bradycardia with sinus node dysfunction, HTN Initially activated as a STEMI- Dr. Okey Dupre consulted with recommendations to treat medically. BNP elevated: 3981 - Cardiology following; appreciate input - Fixed-rate dopamine @ 80mcg/min for cardiac output - Check venous cooximetry - Continue CVP monitoring - Continue hep gtt with goal PTT 50-70 - anti platelet: Plavix continued - beta-blocker: none at this time due to shock - statin: atorvastatin on hold d/t liver injury - Trend troponins to ensure downtrending  Septic shock due to E coli UTI & Aspiration Pneumonia - Continue cefepime, vancomycin through today while awaiting culture data - Supplemental oxygen as needed, to maintain SpO2 > 90% - Daily CBC - monitor WBC/ fever curve - Fixed-rate dopamine as above  Acute Kidney Injury superimposed on CKD Stage IIIb in the setting of shock Non-AG Metabolic Acidosis, resolving Baseline Cr: 1.39-1.57, Cr on admission:3.91 - Strict I/O's: alert provider if UOP < 0.5 mL/kg/hr - Dopamine for chronotropic support; diurese for goal 500 cc to 1L neg fluid balance - Daily BMP, replace electrolytes PRN - Avoid nephrotoxic agents as able, ensure adequate renal perfusion  Elevated LFTs in the setting of shock, STEMI, sepsis - RUQ U/S showed no evidence for cholecystitis or cholangitis - Monitor hepatic panel which currently shows improving liver function  Vascular Dementia History of CVA with residual  L hemiparesis CT head w/o contrast: negative for acute intracranial process. Concern for Aspiration Pna in the setting of described "choking" episode per SNF staff in ED documentation - SLP evaluation ordered - falls precautions - supportive care  Best practice (right click and "Reselect all SmartList Selections" daily)  Diet:   Oral Pain/Anxiety/Delirium protocol (if indicated): No VAP protocol (if indicated): Not indicated DVT prophylaxis: Systemic AC GI prophylaxis: PPI Glucose control:  SSI Yes Central venous access:  Yes, and it is still needed Arterial line:  N/A Foley:  Yes, and it is still needed Mobility:  bed rest  PT consulted: N/A hold given recent MI1 Last date of multidisciplinary goals of care discussion 12/24/20 Code Status:  full code Disposition: ICU  Labs   CBC: Recent Labs  Lab 12/24/20 1540 12/24/20 2155 12/25/20 0124 12/25/20 0425 12/25/20 1757 12/26/20 0228 12/26/20 0457  WBC 26.0*  --  23.5* 19.7* 17.0*  --  16.4*  NEUTROABS 22.9*  --   --   --   --   --   --   HGB 11.4*   < > 11.1* 10.6* 9.7* 9.5* 10.1*  HCT 35.3*   < > 32.4* 31.1* 29.1* 28.3* 29.6*  MCV 99.2  --  94.7 95.7 95.4  --  94.3  PLT 227  --  175 214 198  --  187   < > = values in this interval not displayed.    Basic Metabolic Panel: Recent Labs  Lab 12/24/20 1540 12/25/20 0124 12/25/20 0813 12/25/20 1019 12/26/20 0457  NA 137 144 138 139 140  K 4.9 5.3* 5.0 4.9 4.2  CL 105 103 100 100 101  CO2 17* GLUCOSE 208* 134* 124* 152* 140*  BUN 74* 71* 78* 83* 89*  CREATININE 3.91* 3.83* 3.96* 4.08* 3.96*  CALCIUM 9.0 7.9* 8.2* 8.1* 7.9*  MG 2.4 2.1  --   --  2.3  PHOS  --  6.2*  --   --  5.9*   GFR: Estimated Creatinine Clearance: 10.1 mL/min (A) (by C-G formula based on SCr of 3.96 mg/dL (H)). Recent Labs  Lab 12/24/20 1929 12/24/20 2024 12/25/20 0124 12/25/20 0425 12/25/20 1000 12/25/20 1400 12/25/20 1657 12/25/20 1757 12/26/20 0457  PROCALCITON 3.57  --  4.90  --   --   --   --   --   --   WBC  --   --  23.5* 19.7*  --   --   --  17.0* 16.4*  LATICACIDVEN  --    < > 6.9*  --  2.3* 2.3* 2.3*  --   --    < > = values in this interval not displayed.    Liver Function Tests: Recent Labs  Lab 12/24/20 1540 12/25/20 0124 12/26/20 0457  AST 411* 355* 183*  ALT 95* 136* 103*   ALKPHOS 92 93 78  BILITOT 0.8 0.6 0.8  PROT 7.2 5.2* 5.8*  ALBUMIN 3.6 2.5* 2.7*   Recent Labs  Lab 12/24/20 1929  LIPASE 23   Recent Labs  Lab 12/25/20 0220  AMMONIA 11    ABG    Component Value Date/Time   PHART 7.37 12/25/2020 0107   PCO2ART <19.0 (LL) 12/25/2020 0107   PO2ART 88 12/25/2020 0107   HCO3 24.7 12/25/2020 1020   ACIDBASEDEF 13.7 (H) 12/25/2020 0107   O2SAT 77.7 12/25/2020 1020     Coagulation Profile: Recent Labs  Lab 12/24/20 1929 12/25/20 0419  INR 1.3* 1.4*  Cardiac Enzymes: No results for input(s): CKTOTAL, CKMB, CKMBINDEX, TROPONINI in the last 168 hours.  HbA1C: Hgb A1c MFr Bld  Date/Time Value Ref Range Status  06/04/2017 04:52 AM 5.1 4.8 - 5.6 % Final    Comment:    (NOTE) Pre diabetes:          5.7%-6.4% Diabetes:              >6.4% Glycemic control for   <7.0% adults with diabetes     CBG: Recent Labs  Lab 12/25/20 2336 12/25/20 2348 12/26/20 0317 12/26/20 0734 12/26/20 1118  GLUCAP 34* 130* 157* 113* 161*    Review of Systems: Positives in BOLD  Complete review of systems negative except as documented above.  Past Medical History:  She,  has a past medical history of Arthritis, Bradycardia, CKD (chronic kidney disease), stage III (HCC), CVA (cerebral infarction) (1995), History of brain surgery (2001), History of depression, Hypertension, Sinus node dysfunction w/ competing jxnl rhythm, Tobacco abuse, and Vascular dementia (HCC).   Surgical History:   Past Surgical History:  Procedure Laterality Date  . ABDOMINAL HYSTERECTOMY    . APPENDECTOMY    . TONSILLECTOMY       Social History:   reports that she has been smoking cigarettes. She has been smoking about 1.00 pack per day for the past 0.00 years. She has never used smokeless tobacco. She reports that she does not drink alcohol and does not use drugs.   Family History:  Her Family history is unknown by patient.   Allergies Allergies  Allergen  Reactions  . Ultram [Tramadol]     vomiting     Home Medications  Prior to Admission medications   Medication Sig Start Date End Date Taking? Authorizing Provider  acetaminophen (TYLENOL) 500 MG tablet Take 1,000 mg by mouth every 12 (twelve) hours. May take an additional every 6 hours if needed.   Yes [provider]  aspirin 81 MG tablet Take 81 mg by mouth daily.   Yes [provider]  atorvastatin (LIPITOR) 40 MG tablet Take 1 tablet (40 mg total) by mouth daily at 6 PM. Patient taking differently: Take 40 mg by mouth every evening. 06/08/17  Yes Shaune Pollackhen, Qing, MD  Calcium Carbonate-Vitamin D 600-400 MG-UNIT tablet Take 1 tablet by mouth daily.    Yes [provider]  Cholecalciferol (VITAMIN D3) 10 MCG (400 UNIT) tablet Take 800 Units by mouth daily.   Yes [provider]  citalopram (CELEXA) 40 MG tablet Take 40 mg by mouth daily. 12/06/20  Yes [provider]  clopidogrel (PLAVIX) 75 MG tablet Take 1 tablet (75 mg total) by mouth daily. 06/08/17  Yes Shaune Pollackhen, Qing, MD  enalapril (VASOTEC) 10 MG tablet Take 10 mg by mouth daily.   Yes [provider]  ferrous sulfate 325 (65 FE) MG tablet Take 325 mg by mouth 2 (two) times daily with a meal.   Yes [provider]  fexofenadine (ALLEGRA) 180 MG tablet Take 180 mg by mouth daily.   Yes [provider]  gabapentin (NEURONTIN) 300 MG capsule Take 600 mg by mouth 3 (three) times daily.    Yes [provider]  hydrALAZINE (APRESOLINE) 50 MG tablet Take 50 mg by mouth 3 (three) times daily.   Yes [provider]  pantoprazole (PROTONIX) 20 MG tablet Take 20 mg by mouth daily.   Yes [provider]  polyethylene glycol (MIRALAX / GLYCOLAX) packet Take 17 g by mouth daily.  Yes [provider]  ammonium lactate (AMLACTIN) 12 % cream Apply topically as needed for dry skin (LEFT FOOT).    [provider]  cephALEXin (KEFLEX) 500 MG capsule  Take 1 capsule (500 mg total) by mouth 2 (two) times daily. Patient not taking: No sig reported 03/04/19   Jene Every, MD  cloNIDine (CATAPRES - DOSED IN MG/24 HR) 0.1 mg/24hr patch Place 0.1 mg onto the skin once a week.    [provider]  nystatin cream (MYCOSTATIN) Apply 1 application topically 2 (two) times daily. Until redness is healed Patient not taking: Reported on 12/24/2020    [provider]     Critical care time: 39 minutes       Daelan Gatt Laney Pastor, MD 12/26/20 12:07 PM   [facility]  Please see Amion for pager details.

## 2020-12-26 NOTE — Consult Note (Signed)
ANTICOAGULATION CONSULT NOTE  Pharmacy Consult for heparin Indication: chest pain/ACS  Patient Measurements: Height: 5\' 6"  (167.6 cm) Weight: 55.7 kg (122 lb 12.8 oz) IBW/kg (Calculated) : 59.3 Heparin Dosing Weight: 55.7  Vital Signs: Temp: 100.04 F (37.8 C) (04/17 1500) Temp Source: Bladder (04/17 0800) BP: 110/73 (04/17 1500) Pulse Rate: 58 (04/17 1500)  Labs: Recent Labs    12/24/20 1640 12/24/20 1929 12/24/20 2155 12/25/20 0124 12/25/20 0419 12/25/20 0425 12/25/20 0813 12/25/20 1019 12/25/20 1757 12/26/20 0228 12/26/20 0457 12/26/20 0506 12/26/20 1258 12/26/20 1510  HGB  --   --    < > 11.1*  --  10.6*  --   --  9.7* 9.5* 10.1*  --   --   --   HCT  --   --    < > 32.4*  --  31.1*  --   --  29.1* 28.3* 29.6*  --   --   --   PLT  --   --   --  175  --  214  --   --  198  --  187  --   --   --   APTT  --  34  --   --  36  --   --   --   --   --   --   --   --   --   LABPROT  --  16.5*  --   --  17.2*  --   --   --   --   --   --   --   --   --   INR  --  1.3*  --   --  1.4*  --   --   --   --   --   --   --   --   --   HEPARINUNFRC  --   --   --   --   --   --   --   --  0.11*  --   --  0.18*  --  0.43  CREATININE  --   --   --  3.83*  --   --  3.96* 4.08*  --   --  3.96*  --   --   --   TROPONINIHS >27,000*  --   --  >27,000*  --   --   --   --   --   --   --   --  >27,000*  --    < > = values in this interval not displayed.    Estimated Creatinine Clearance: 10.1 mL/min (A) (by C-G formula based on SCr of 3.96 mg/dL (H)).   Medications:  No anticoagulants prior to admission per chart review   Assessment: 80 yo F presented with slurred speech and lethargy, last known well time unknown. PMH includes bradycardia, dementia, stroke, seizures, HTN, CKD, and CVA. EKG concerning for STEMI. Pharmacy consulted to dose heparin for ACS/STEMI. Heparin was stopped due to hematochezia but given elevated cardiac risk it is being restarted. HS Tn  elevation  Date Time HL Rate/comment 4/16 1757 0.11 600 units/hr - subtherapeutic 4/17 0506 0.18 Increased to 1050 units/hr 4/17 1510 0.43 Continue 1050 units/hr  Goal of Therapy:  Heparin level 0.3-0.5 units/ml Monitor platelets by anticoagulation protocol: Yes   Plan:  Heparin level is therapeutic. Will continue heparin infusion rate at 1050 units/hr. Check confirmatory heparin level in 8 hours.  CBC daily while on heparin. Monitor  Hgb and Plt. Plan to aim for a goal of 0.3 to 0.5 due to issue of bleeding.   Jad Johansson A, PharmD 12/26/2020 3:36 PM

## 2020-12-26 NOTE — Progress Notes (Signed)
PHARMACY CONSULT NOTE  Pharmacy Consult for Electrolyte Monitoring and Replacement   Recent Labs: Potassium (mmol/L)  Date Value  12/26/2020 4.2  08/01/2012 4.6   Magnesium (mg/dL)  Date Value  09/03/8249 2.3  07/31/2012 2.2   Calcium (mg/dL)  Date Value  03/70/4888 7.9 (L)   Calcium, Total (mg/dL)  Date Value  91/69/4503 8.8   Albumin (g/dL)  Date Value  88/82/8003 2.7 (L)  07/31/2012 4.0   Phosphorus (mg/dL)  Date Value  49/17/9150 5.9 (H)   Sodium (mmol/L)  Date Value  12/26/2020 140  08/01/2012 140   Corrected Ca: 8.9 mg/dL  Assessment: 80 yo F presented with slurred speech and lethargy, last known well time unknown. PMH includes bradycardia, dementia, stroke, seizures, HTN, CKD, and CVA. EKG concerning for STEMI. Pharmacy consulted to dose heparin for ACS/STEMI. Pharmacy has been asked to review and replace electrolytes while this patient is in the ICU.  Goal of Therapy:  Potassium 4.0 - 5.1 mmol/L Magnesium 2.0 - 2.4 mg/dL All Other Electrolytes WNL  Plan:   No electrolyte replacement required at this time  Re-check electrolytes with am labs  Lowella Bandy ,PharmD Clinical Pharmacist 12/26/2020 9:11 AM

## 2020-12-26 NOTE — Consult Note (Signed)
ANTICOAGULATION CONSULT NOTE  Pharmacy Consult for heparin Indication: chest pain/ACS  Patient Measurements: Height: 5\' 6"  (167.6 cm) Weight: 55.7 kg (122 lb 12.8 oz) IBW/kg (Calculated) : 59.3 Heparin Dosing Weight: 55.7  Vital Signs: Temp: 100.22 F (37.9 C) (04/17 1700) Temp Source: (P) Bladder (04/17 2000) BP: 111/76 (04/17 1700) Pulse Rate: (P) 64 (04/17 2000)  Labs: Recent Labs    12/24/20 1929 12/24/20 2155 12/25/20 0124 12/25/20 0419 12/25/20 0425 12/25/20 0813 12/25/20 1019 12/25/20 1757 12/26/20 0228 12/26/20 0457 12/26/20 0506 12/26/20 1258 12/26/20 1510 12/26/20 1648 12/26/20 1814 12/26/20 2253  HGB  --    < > 11.1*  --    < >  --   --  9.7* 9.5* 10.1*  --   --   --  10.0*  --   --   HCT  --    < > 32.4*  --    < >  --   --  29.1* 28.3* 29.6*  --   --   --  29.9*  --   --   PLT  --   --  175  --    < >  --   --  198  --  187  --   --   --  178  --   --   APTT 34  --   --  36  --   --   --   --   --   --   --   --   --   --   --   --   LABPROT 16.5*  --   --  17.2*  --   --   --   --   --   --   --   --   --   --   --   --   INR 1.3*  --   --  1.4*  --   --   --   --   --   --   --   --   --   --   --   --   HEPARINUNFRC  --   --   --   --    < >  --   --  0.11*  --   --  0.18*  --  0.43  --   --  0.33  CREATININE  --   --  3.83*  --   --  3.96* 4.08*  --   --  3.96*  --   --   --   --   --   --   TROPONINIHS  --   --  >27,000*  --   --   --   --   --   --   --   --  >27,000*  --   --  >27,000*  --    < > = values in this interval not displayed.    Estimated Creatinine Clearance: 10.1 mL/min (A) (by C-G formula based on SCr of 3.96 mg/dL (H)).   Medications:  No anticoagulants prior to admission per chart review   Assessment: 80 yo F presented with slurred speech and lethargy, last known well time unknown. PMH includes bradycardia, dementia, stroke, seizures, HTN, CKD, and CVA. EKG concerning for STEMI. Pharmacy consulted to dose heparin for  ACS/STEMI. Heparin was stopped due to hematochezia but given elevated cardiac risk it is being restarted. HS Tn elevation  Date Time HL Rate/comment 4/16 1757 0.11  600 units/hr - subtherapeutic 4/17 0506 0.18 Increased to 1050 units/hr 4/17 1510 0.43 Continue 1050 units/hr 4/17 2253 0.33 Continue 1050 units/hr.   Goal of Therapy:  Heparin level 0.3-0.5 units/ml Monitor platelets by anticoagulation protocol: Yes   Plan:  Heparin level is therapeutic. Will continue heparin infusion rate at 1050 units/hr. Recheck heparin and CBC with AM labs.  Monitor Hgb and Plt. Plan to aim for a goal of 0.3 to 0.5 due to issue of bleeding.   Ronnald Ramp, PharmD 12/26/2020 11:15 PM

## 2020-12-27 DIAGNOSIS — A419 Sepsis, unspecified organism: Secondary | ICD-10-CM | POA: Diagnosis not present

## 2020-12-27 DIAGNOSIS — N179 Acute kidney failure, unspecified: Secondary | ICD-10-CM | POA: Diagnosis not present

## 2020-12-27 DIAGNOSIS — Z7189 Other specified counseling: Secondary | ICD-10-CM

## 2020-12-27 DIAGNOSIS — I214 Non-ST elevation (NSTEMI) myocardial infarction: Secondary | ICD-10-CM | POA: Diagnosis not present

## 2020-12-27 DIAGNOSIS — R001 Bradycardia, unspecified: Secondary | ICD-10-CM

## 2020-12-27 DIAGNOSIS — Z515 Encounter for palliative care: Secondary | ICD-10-CM

## 2020-12-27 LAB — GLUCOSE, CAPILLARY
Glucose-Capillary: 42 mg/dL — CL (ref 70–99)
Glucose-Capillary: 127 mg/dL — ABNORMAL HIGH (ref 70–99)
Glucose-Capillary: 125 mg/dL — ABNORMAL HIGH (ref 70–99)
Glucose-Capillary: 141 mg/dL — ABNORMAL HIGH (ref 70–99)
Glucose-Capillary: 151 mg/dL — ABNORMAL HIGH (ref 70–99)
Glucose-Capillary: 193 mg/dL — ABNORMAL HIGH (ref 70–99)
Glucose-Capillary: 170 mg/dL — ABNORMAL HIGH (ref 70–99)

## 2020-12-27 LAB — COMPREHENSIVE METABOLIC PANEL
ALT: 73 U/L — ABNORMAL HIGH (ref 0–44)
AST: 93 U/L — ABNORMAL HIGH (ref 15–41)
Albumin: 2.5 g/dL — ABNORMAL LOW (ref 3.5–5.0)
Alkaline Phosphatase: 70 U/L (ref 38–126)
Anion gap: 15 (ref 5–15)
BUN: 97 mg/dL — ABNORMAL HIGH (ref 8–23)
CO2: 24 mmol/L (ref 22–32)
Calcium: 8 mg/dL — ABNORMAL LOW (ref 8.9–10.3)
Chloride: 103 mmol/L (ref 98–111)
Creatinine, Ser: 3.69 mg/dL — ABNORMAL HIGH (ref 0.44–1.00)
GFR, Estimated: 12 mL/min — ABNORMAL LOW (ref >60)
Glucose, Bld: 141 mg/dL — ABNORMAL HIGH (ref 70–99)
Potassium: 3.8 mmol/L (ref 3.5–5.1)
Sodium: 142 mmol/L (ref 135–145)
Total Bilirubin: 0.9 mg/dL (ref 0.3–1.2)
Total Protein: 6.1 g/dL — ABNORMAL LOW (ref 6.5–8.1)

## 2020-12-27 LAB — PHOSPHORUS: Phosphorus: 6.4 mg/dL — ABNORMAL HIGH (ref 2.5–4.6)

## 2020-12-27 LAB — CBC
HCT: 28.5 % — ABNORMAL LOW (ref 36.0–46.0)
Hemoglobin: 9.6 g/dL — ABNORMAL LOW (ref 12.0–15.0)
MCH: 32.4 pg (ref 26.0–34.0)
MCHC: 33.7 g/dL (ref 30.0–36.0)
MCV: 96.3 fL (ref 80.0–100.0)
Platelets: 172 10*3/uL (ref 150–400)
RBC: 2.96 MIL/uL — ABNORMAL LOW (ref 3.87–5.11)
RDW: 14 % (ref 11.5–15.5)
WBC: 13.5 10*3/uL — ABNORMAL HIGH (ref 4.0–10.5)
nRBC: 0.2 % (ref 0.0–0.2)

## 2020-12-27 LAB — VANCOMYCIN, RANDOM: Vancomycin Rm: 18

## 2020-12-27 LAB — LEGIONELLA PNEUMOPHILA SEROGP 1 UR AG: L. pneumophila Serogp 1 Ur Ag: NEGATIVE

## 2020-12-27 LAB — HEMOGLOBIN A1C
Hgb A1c MFr Bld: 5.7 % — ABNORMAL HIGH (ref 4.8–5.6)
Mean Plasma Glucose: 117 mg/dL

## 2020-12-27 LAB — URINE CULTURE: Culture: >=100000 — AB

## 2020-12-27 LAB — PROCALCITONIN: Procalcitonin: 4.79 ng/mL

## 2020-12-27 LAB — TROPONIN I (HIGH SENSITIVITY)
Troponin I (High Sensitivity): >27000 ng/L (ref <18)
Troponin I (High Sensitivity): >27000 ng/L (ref <18)

## 2020-12-27 LAB — MAGNESIUM: Magnesium: 2.3 mg/dL (ref 1.7–2.4)

## 2020-12-27 LAB — HEPARIN LEVEL (UNFRACTIONATED): Heparin Unfractionated: 0.35 IU/mL (ref 0.30–0.70)

## 2020-12-27 MED ORDER — MORPHINE SULFATE (PF) 2 MG/ML IV SOLN
INTRAVENOUS | Status: AC
  Filled 2020-12-27: qty 1

## 2020-12-27 MED ORDER — AMIODARONE HCL IN DEXTROSE 360-4.14 MG/200ML-% IV SOLN
30.0000 mg/h | INTRAVENOUS | Status: DC
  Administered 2020-12-27 – 2020-12-28 (×2): 30 mg/h via INTRAVENOUS
  Filled 2020-12-27: qty 200

## 2020-12-27 MED ORDER — AMIODARONE LOAD VIA INFUSION
150.0000 mg | Freq: Once | INTRAVENOUS | Status: AC
  Administered 2020-12-27: 150 mg via INTRAVENOUS
  Filled 2020-12-27: qty 83.34

## 2020-12-27 MED ORDER — MORPHINE SULFATE (PF) 2 MG/ML IV SOLN
1.0000 mg | Freq: Once | INTRAVENOUS | Status: AC
Start: 2020-12-27 — End: 2020-12-27
  Administered 2020-12-27: 1 mg via INTRAVENOUS

## 2020-12-27 MED ORDER — POTASSIUM CHLORIDE CRYS ER 20 MEQ PO TBCR
20.0000 meq | EXTENDED_RELEASE_TABLET | Freq: Once | ORAL | Status: AC
  Administered 2020-12-27: 20 meq via ORAL
  Filled 2020-12-27: qty 1

## 2020-12-27 MED ORDER — AMIODARONE HCL IN DEXTROSE 360-4.14 MG/200ML-% IV SOLN
60.0000 mg/h | INTRAVENOUS | Status: AC
  Administered 2020-12-27 (×2): 60 mg/h via INTRAVENOUS
  Filled 2020-12-27 (×2): qty 200

## 2020-12-27 NOTE — TOC Progression Note (Addendum)
Transition of Care [facility]) - Progression Note    Patient Details  Name: AERIN DELANY MRN: [id number] Date of Birth: September 07, 1941  [facility] CM/SW Contact  Judith [place] Cellar, RN Phone Number: 12/27/2020, 9:36 AM  Clinical Narrative:    LV[facility]  for Theodis Shove [phone] requesting callback to update on hospitalization.   Spoke with Shanda Bumps @ DSS and confirmed patient resides at [facility] and plan remains for her to return if medically appropriate at discharge. Shanda Bumps confirms she has spoken with Crystal @ Palliative and been updated that prognosis remains poor.         Expected Discharge Plan and Services                                                 Social Determinants of Health (SDOH) Interventions    Readmission Risk Interventions No flowsheet data found.

## 2020-12-27 NOTE — Progress Notes (Signed)
NAME:  Joanna Stone, MRN:  [id number], DOB:  05-28-1941, LOS: 3 ADMISSION DATE:  12/24/2020, CONSULTATION DATE:  12/24/2020 REFERRING MD:  Dr. Adaline Sill. Smith, CHIEF COMPLAINT:   Fatigue & Aphasia  History of Present Illness:  Joanna Stone is a 80 y.o. female from SNF here for inferior STEMI (medically managed), septic shock 2/2 suspected CAP, profound lactic acidosis, AKI and elevated LFTs. Troponin and BNP significantly elevated. EKG in last 24 hours shows evolution of inferior STEMI. She required low-dose dopamine with improvement in heart rate and hypotension. Lactic acid peaked at 9. Blood gas showed respiratory compensation. Procalcitonin elevated at 4.9. Broad spectrum antibiotics initiated in ED. Imaging obtained includes CXR suggesting L>R bibasilar opacities and cardiomegaly, RUQ abd suggesting fatty liver and CT a/p showing small amount of free pelvic fluid without evidence for rupture or perforation. U/A shows moderate leuks, many bacteria and mucus. Echocardiogram completed showing EF of 25% with wall motion abnormalities.   Significant labs: Troponin elevated > 27,000 x 2, BNP elevated at 3981.5, Transaminitis: AST 411, ALT 95, lactic acidosis 6.1, leukocytosis 26, AKI with BUN/Cr: 74/3.91, serum CO2 17.  UA consistent with UTI: + Leukocytes, + many bacteria, > 50 WBCs.  Chest x-ray consistent with developing RLL pneumonia & mild pulmonary edema versus aspiration.  PCCM consulted for admission due to patient's complex picture. 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 rate (!) 26, height 5\' 6"  (1.676 m), weight 55.7 kg, SpO2 100 %. CVP:  [13 mmHg-20 mmHg] 13 mmHg      Intake/Output Summary (Last 24 hours) at 12/27/2020 0745 Last data filed at 12/26/2020 2350 Gross per 24 hour  Intake 607.71 ml  Output 900 ml  Net -292.29 ml   Filed Weights   12/24/20 1503  Weight: 55.7 kg    Review of Systems:  Gen:  Denies  pain Cardiac:   no CP Resp:   No cough, --hemoptysis,  Other:  All other systems negative  PHYSICAL EXAMINATION:  GENERAL:critically ill appearing, HEAD: Normocephalic, atraumatic.  EYES: Pupils equal, round, reactive to light.  No scleral icterus.  MOUTH: Moist mucosal membrane. NECK: Supple. No thyromegaly. No nodules. No JVD.  PULMONARY: +rhonchi, +wheezing CARDIOVASCULAR: S1 and S2. Regular rate and rhythm. No murmurs, rubs, or gallops.  GASTROINTESTINAL: Soft, nontender, -distended. Positive bowel sounds.  MUSCULOSKELETAL: No swelling, clubbing, or edema.  NEUROLOGIC: alert and awake SKIN:intact,warm,dry    Labs/imaging that I have personally reviewed  (right click and "Reselect all SmartList Selections" daily)  4/16 TTE  ventricular ejection fraction, by estimation, is 25 to 30%.   12/24/20 CT head w/o contrast >> No acute intracranial abnormality.  12/24/20 RUQ US >> solitary RIGHT kidney with evidence of chronic renal disease, no hydronephrosis  CXR 12/24/2020: Increased central vascular congestion with developing right basilar consolidation.  Possibly mild pulmonary edema versus aspiration Resolved Hospital Problem list  N/A  Assessment & Plan:   80 yo female with acute STEMI with acute systolic heart failure and cardiogenic and septic  shock with severe  acdiosis Severe Bradycardia with sinus node dysfunction   ACUTE STEMI Follow up cardiology recs Continue full AC Wean off dopamine as tolerated  Septic shock E coli UTI & Aspiration Pneumonia -use vasopressors to keep MAP>65 -follow ABG and LA -follow up cultures  ABX -consider stress dose steroids  ACUTE KIDNEY INJURY/Renal Failure -continue Foley Catheter-assess need -Avoid nephrotoxic agents -Follow urine output, BMP -Ensure adequate renal perfusion, optimize oxygenation -Renal dose medications';  BMP Latest Ref Rng & Units 12/27/2020 12/26/2020 12/25/2020  Glucose 70 - 99 mg/dL 086(P) 619(J) 093(O)  BUN 8 - 23 mg/dL 67(T) 24(P) 80(D)  Creatinine 0.44 - 1.00 mg/dL 9.83(J) 8.25(K) 5.39(J)  Sodium 135 - 145 mmol/L 142 140 139  Potassium 3.5 - 5.1 mmol/L 3.8 4.2 4.9  Chloride 98 - 111 mmol/L 103 101 100  CO2 22 - 32 mmol/L 24 26 24   Calcium 8.9 - 10.3 mg/dL 8.0(L) 7.9(L) 8.1(L)     Intake/Output Summary (Last 24 hours) at 12/27/2020 0749 Last data filed at 12/26/2020 2350 Gross per 24 hour  Intake 607.71 ml  Output 900 ml  Net -292.29 ml    ELEVATED LFT's-from shock Follow CMP    Best practice (right click and "Reselect all SmartList Selections" daily)  Diet:  Oral Pain/Anxiety/Delirium protocol (if indicated): No VAP protocol (if indicated): Not indicated DVT prophylaxis: Systemic AC GI prophylaxis: PPI Glucose control:  SSI Yes Central venous access:  Yes, and it is still needed Arterial line:  N/A Foley:  Yes, and it is still needed Mobility:  bed rest  PT consulted: N/A hold given recent MI1 Last date of multidisciplinary goals of care discussion 12/24/20 Code Status:  full code Disposition: ICU  Labs   CBC: Recent Labs  Lab 12/24/20 1540 12/24/20 2155 12/25/20 0425 12/25/20 1757 12/26/20 0228 12/26/20 0457 12/26/20 1648 12/26/20 2248 12/27/20 0501  WBC 26.0*   < > 19.7* 17.0*  --  16.4* 15.1*  --  13.5*  NEUTROABS 22.9*  --   --   --   --    --   --   --   --   HGB 11.4*   < > 10.6* 9.7* 9.5* 10.1* 10.0* 10.0* 9.6*  HCT 35.3*   < > 31.1* 29.1* 28.3* 29.6* 29.9* 30.7* 28.5*  MCV 99.2   < > 95.7 95.4  --  94.3 95.5  --  96.3  PLT 227   < > 214 198  --  187 178  --  172   < > = values in this interval not displayed.    Basic Metabolic Panel: Recent Labs  Lab 12/24/20 1540 12/25/20 0124 12/25/20 0813 12/25/20 1019 12/26/20 0457 12/27/20 0501  NA 137 144 138 139 140 142  K 4.9 5.3* 5.0 4.9 4.2 3.8  CL 105 103 100 100 101 103  CO2 17* 23 24 24 26 24   GLUCOSE 208* 134* 124* 152* 140* 141*  BUN 74* 71* 78* 83* 89* 97*  CREATININE 3.91* 3.83* 3.96* 4.08* 3.96* 3.69*  CALCIUM 9.0 7.9* 8.2* 8.1* 7.9* 8.0*  MG 2.4 2.1  --   --  2.3 2.3  PHOS  --  6.2*  --   --  5.9* 6.4*   GFR: Estimated Creatinine Clearance: 10.9 mL/min (A) (by C-G formula based on SCr of 3.69 mg/dL (H)). Recent Labs  Lab 12/24/20  1929 12/24/20 2024 12/25/20 0124 12/25/20 0425 12/25/20 1000 12/25/20 1400 12/25/20 1657 12/25/20 1757 12/26/20 0457 12/26/20 1229 12/26/20 1648 12/27/20 0501  PROCALCITON 3.57  --  4.90  --   --   --   --   --   --   --   --   --   WBC  --   --  23.5*   < >  --   --   --  17.0* 16.4*  --  15.1* 13.5*  LATICACIDVEN  --    < > 6.9*  --  2.3* 2.3* 2.3*  --   --  2.1*  --   --    < > = values in this interval not displayed.    Liver Function Tests: Recent Labs  Lab 12/24/20 1540 12/25/20 0124 12/26/20 0457 12/27/20 0501  AST 411* 355* 183* 93*  ALT 95* 136* 103* 73*  ALKPHOS 92 93 78 70  BILITOT 0.8 0.6 0.8 0.9  PROT 7.2 5.2* 5.8* 6.1*  ALBUMIN 3.6 2.5* 2.7* 2.5*   Recent Labs  Lab 12/24/20 1929  LIPASE 23   Recent Labs  Lab 12/25/20 0220  AMMONIA 11    ABG    Component Value Date/Time   PHART 7.37 12/25/2020 0107   PCO2ART <19.0 (LL) 12/25/2020 0107   PO2ART 88 12/25/2020 0107   HCO3 24.7 12/25/2020 1020   ACIDBASEDEF 13.7 (H) 12/25/2020 0107   O2SAT 77.7 12/25/2020 1020     Coagulation  Profile: Recent Labs  Lab 12/24/20 1929 12/25/20 0419  INR 1.3* 1.4*    Cardiac Enzymes: No results for input(s): CKTOTAL, CKMB, CKMBINDEX, TROPONINI in the last 168 hours.  HbA1C: Hgb A1c MFr Bld  Date/Time Value Ref Range Status  12/25/2020 02:20 AM 5.7 (H) 4.8 - 5.6 % Final    Comment:    (NOTE)         Prediabetes: 5.7 - 6.4         Diabetes: >6.4         Glycemic control for adults with diabetes: <7.0   06/04/2017 04:52 AM 5.1 4.8 - 5.6 % Final    Comment:    (NOTE) Pre diabetes:          5.7%-6.4% Diabetes:              >6.4% Glycemic control for   <7.0% adults with diabetes     CBG: Recent Labs  Lab 12/26/20 1607 12/26/20 1940 12/26/20 2317 12/27/20 0358 12/27/20 0741  GLUCAP 124* 150* 135* 127* 125*    Review of Systems: Positives in BOLD  Complete review of systems negative except as documented above.  Past Medical History:  She,  has a past medical history of Arthritis, Bradycardia, CKD (chronic kidney disease), stage III (HCC), CVA (cerebral infarction) (1995), History of brain surgery (2001), History of depression, Hypertension, Sinus node dysfunction w/ competing jxnl rhythm, Tobacco abuse, and Vascular dementia (HCC).     Allergies Allergies  Allergen Reactions   Ultram [Tramadol]     vomiting      DVT/GI PRX  assessed I Assessed the need for Labs I Assessed the need for Foley I Assessed the need for Central Venous Line Family Discussion when available I Assessed the need for Mobilization I made an Assessment of medications to be adjusted accordingly Safety Risk assessment completed  CASE DISCUSSED IN MULTIDISCIPLINARY ROUNDS WITH ICU TEAM     Critical Care Time devoted to patient care services described in this note is  55 minutes.   Overall, patient is critically ill, prognosis is guarded.  Patient with Multiorgan failure and at high risk for cardiac arrest and death.    Recommend DNR/DNI status  Lupe Bonner Santiago Glad, M.D.   [facility]  Medical Director [facility] Medical Director [facility] Cardio-Pulmonary Department

## 2020-12-27 NOTE — Progress Notes (Signed)
   12/27/20 1245  Clinical Encounter Type  Visited With Patient and family together  Visit Type Initial;Spiritual support;Social support  Referral From Nurse  Consult/Referral To Chaplain   Chaplain responded to a Code [employer]. When the Chaplain arrived, the nurse was actively examining the PT. The Chaplain assessed the situation and ministered with presence. Chaplain will try to follow up later.

## 2020-12-27 NOTE — Progress Notes (Signed)
Progress Note  Patient Name: Joanna Stone Date of Encounter: 12/27/2020  Primary Cardiologist: New to [facility] - End  Subjective   She denies chest pain, dyspnea, or palpitations. Documented UOP ~ 300 mL for the past 24 hours, net + ~ 1 L for the admission. No recent weight. Renal and liver function improving. HGB remains low, though is stable. She remains on dopamine and heparin gtts. Lasix gtt has been stopped.   Inpatient Medications    Scheduled Meds: . Chlorhexidine Gluconate Cloth  6 each Topical Q0600  . Chlorhexidine Gluconate Cloth  6 each Topical Daily  . clopidogrel  75 mg Oral Daily  . insulin aspart  0-6 Units Subcutaneous Q4H  . mupirocin ointment  1 application Nasal BID  . pantoprazole  40 mg Oral Daily  . vancomycin variable dose per unstable renal function (pharmacist dosing)   Does not apply See admin instructions   Continuous Infusions: . sodium chloride Stopped (12/26/20 1933)  . ceFEPime (MAXIPIME) IV Stopped (12/26/20 1010)  . DOPamine 5 mcg/kg/min (12/27/20 0249)  . heparin 1,050 Units/hr (12/26/20 2350)   PRN Meds: sodium chloride, acetaminophen **OR** acetaminophen, docusate sodium, polyethylene glycol   Vital Signs    Vitals:   12/27/20 0300 12/27/20 0400 12/27/20 0500 12/27/20 0600  BP: 123/88 (!) 126/91 122/87 109/84  Pulse: 89 (!) 101 87 96  Resp: (!) 26 18 (!) 23 (!) 26  Temp: (!) 100.58 F (38.1 C) 100.04 F (37.8 C) 100.04 F (37.8 C) 100.04 F (37.8 C)  TempSrc: Bladder Bladder Bladder Bladder  SpO2: 100% 98% 100% 100%  Weight:      Height:        Intake/Output Summary (Last 24 hours) at 12/27/2020 0804 Last data filed at 12/26/2020 2350 Gross per 24 hour  Intake 607.71 ml  Output 900 ml  Net -292.29 ml   Filed Weights   12/24/20 1503  Weight: 55.7 kg    Telemetry    Afib 80s bpm - Personally Reviewed  ECG    No new tracings - Personally Reviewed  Physical Exam   GEN: No acute distress. Frail and elderly  appearing.  Neck: No JVD. Cardiac: IRIR, III/VI systolic murmur LSB, no rubs, or gallops.  Respiratory: Diminished breath sounds bilaterally.  GI: Soft, nontender, non-distended.   MS: No edema; No deformity. Neuro:  Alert and oriented x 3; Nonfocal.  Psych: Normal affect.  Labs    Chemistry Recent Labs  Lab 12/25/20 0124 12/25/20 0813 12/25/20 1019 12/26/20 0457 12/27/20 0501  NA 144   < > 139 140 142  K 5.3*   < > 4.9 4.2 3.8  CL 103   < > 100 101 103  CO2 23   < > 24 26 24   GLUCOSE 134*   < > 152* 140* 141*  BUN 71*   < > 83* 89* 97*  CREATININE 3.83*   < > 4.08* 3.96* 3.69*  CALCIUM 7.9*   < > 8.1* 7.9* 8.0*  PROT 5.2*  --   --  5.8* 6.1*  ALBUMIN 2.5*  --   --  2.7* 2.5*  AST 355*  --   --  183* 93*  ALT 136*  --   --  103* 73*  ALKPHOS 93  --   --  78 70  BILITOT 0.6  --   --  0.8 0.9  GFRNONAA 11*   < > 11* 11* 12*  ANIONGAP 18*   < > 15 13 15    < > =  values in this interval not displayed.     Hematology Recent Labs  Lab 12/26/20 0457 12/26/20 1648 12/26/20 2248 12/27/20 0501  WBC 16.4* 15.1*  --  13.5*  RBC 3.14* 3.13*  --  2.96*  HGB 10.1* 10.0* 10.0* 9.6*  HCT 29.6* 29.9* 30.7* 28.5*  MCV 94.3 95.5  --  96.3  MCH 32.2 31.9  --  32.4  MCHC 34.1 33.4  --  33.7  RDW 14.0 13.9  --  14.0  PLT 187 178  --  172    Cardiac EnzymesNo results for input(s): TROPONINI in the last 168 hours. No results for input(s): TROPIPOC in the last 168 hours.   BNP Recent Labs  Lab 12/24/20 1540  BNP 3,981.5*     DDimer  Recent Labs  Lab 12/25/20 0419  DDIMER 4.53*     Radiology    No new studies  Cardiac Studies   2D echo 12/25/2020: 1. Left ventricular ejection fraction, by estimation, is 25 to 30%. The  left ventricle has severely decreased function. The left ventricle  demonstrates regional wall motion abnormalities (see scoring  diagram/findings for description). The left  ventricular internal cavity size was mildly dilated. Left ventricular   diastolic function could not be evaluated.  2. Right ventricular systolic function is normal. The right ventricular  size is normal.  3. Left atrial size was severely dilated.  4. Right atrial size was moderately dilated.  5. The mitral valve is normal in structure. Severe mitral valve  regurgitation.  6. Tricuspid valve regurgitation is moderate to severe.  7. The aortic valve is normal in structure. Aortic valve regurgitation is  mild.  Patient Profile     80 y.o. female with history of sinus node dysfunction, multiple CVAs with left-sided hemiparesis, vascular dementia, CKD stage III, and HTN who we are seeing for late presenting inferior STEMI complicated by acute HFrEF, Afib, multiorgan dysfunction, and possible GI bleed.   Assessment & Plan    1. Inferior STEMI: -No chest pain -Heparin gtt as below with Afib, has completed 48 hours of therapy  -Plavix  -High sensitivity troponin remains > 27,000, likely in the setting of poor renal clearance with acute on CKD -She has declined invasive evaluation/treatment, and has also not been felt to be a good candidate for cardiac cath in the context of multiorgan dysfunction, anemia with possible GI bleeding, septic shock, and underlying vascular dementia  -Not currently on a beta blocker with history of significant bradycardia/sinus node dysfunction and in the context of septic shock requiring dopamine  -Not currently on a statin with transaminitis   2. 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 Afib: -Ventricular rates currently well controlled -Not requiring rate controlling medications at this time -CHADS2VASc 7 (CHF, HTN, age x 2, CVA x 2, sex  category) -Heparin gtt for now -With her vascular dementia and other comorbid conditions, it remains uncertain if she would be a candidate for OAC moving forward  -TSH normal this admission  4. Septic shock: -BP stable -Dopamine management per CCM -ABX per CCM  5. Multiorgan dysfunction: -Renal and liver are improving  6. Vascular dementia with prior CVA with residual left-sided hemiparesis: -No new deficits noted -Per primary service   7. Anemia with  possible GI bleed: -HGB low, though relatively stable -Monitor on heparin and Plavix  Dispo: -Overall, her prognosis appears to be quiet poor with high risk of in-hospital death remaining. Consider palliative care consult for goals of care discussion   For questions or updates, please contact [facility] Please consult [email or web address] for contact info under Cardiology/STEMI.    Signed, Eula Listen, PA-C [facility] Pager: [phone] 12/27/2020, 8:04 AM

## 2020-12-27 NOTE — Progress Notes (Signed)
GOALS OF CARE DISCUSSION  The Clinical status was relayed to family in detail.  Called to speak with her DSS worker as she has a legal guardian Theodis Shove [phone].  Patient with CARDIAC ARREST AND CODE BLUE Updated and notified of patients medical condition.  Patient remains unresponsive and will not open eyes to command.     Patient with Progressive multiorgan failure with a very high probablity of a very minimal chance of meaningful recovery despite all aggressive and optimal medical therapy. Patient is in the Dying  Process associated with Suffering.   The Legal gaurdian has consented and agreed to DNR/DNI  Family are satisfied with Plan of action and management. All questions answered  Additional CC time 35 mins   Sol Odor Santiago Glad, M.D.  [facility]  Medical Director [facility] Medical Director [facility] Cardio-Pulmonary Department

## 2020-12-27 NOTE — Consult Note (Signed)
Consultation Note Date: 12/27/2020   Patient Name: Joanna Stone  DOB: 24-Nov-1940  MRN: [id number]  Age / Sex: 80 y.o., female  PCP: Patient, No Pcp Per (Inactive) Referring Physician: Erin Fulling, MD  Reason for Consultation: Establishing goals of care  HPI/Patient Profile: Ms.Ofarrell is a 80 y.o. female from SNF here for inferior STEMI (medically managed), septic shock 2/2 suspected CAP, profound lactic acidosis, AKI and elevated LFTs. Troponin and BNP significantly elevated. EKG in last 24 hours shows evolution of inferior STEMI. She required low-dose dopamine with improvement in heart rate and hypotension. Lactic acid peaked at 9. Blood gas showed respiratory compensation. Procalcitonin elevated at 4.9. Broad spectrum antibiotics initiated in ED. Imaging obtained includes CXR suggesting L>R bibasilar opacities and cardiomegaly, RUQ abd suggesting fatty liver and CT a/p showing small amount of free pelvic fluid without evidence for rupture or perforation. U/A shows moderate leuks, many bacteria and mucus. Echocardiogram completed showing EF of 25% with wall motion abnormalities.   Clinical Assessment and Goals of Care: Patient is resting in bed. She is thin and frail.  She is able to tell me her name, that we are at the hospital, and that Jackquline Bosch is president. She states she lives at a nursing facility.   We discussed her diagnoses, prognosis, GOC, EOL wishes disposition and options.   A detailed discussion was had today regarding advanced directives.  Concepts specific to code status, artifical feeding and hydration, IV antibiotics and rehospitalization were discussed.  The difference between an aggressive medical intervention path and a comfort care path was discussed.  Values and goals of care important to patient and family were attempted to be elicited.  Discussed limitations of medical interventions to  prolong quality of life in some situations and discussed the concept of human mortality. Quality of life is important to her.  She states she would not want chest compressions, shocks, or a breathing tube. She states "if my heart stops, leave it."  She was clear she would not want to be placed on a ventilator for respiratory depression/ failure. She voiced understanding that not having these interventions if they were indicated would mean she would die. Inquired as to if she would want to return to the hospital if she became sick again and she said "no." She indicates that she wants to be made comfortable for what time she has left on earth.    Called to speak with her DSS worker as she has a legal guardian Theodis Shove [phone]. Shanda Bumps advises she has been working with Ms. Caralee Ates since January 2022.    We discussed diagnoses, poor prognosis, GOC, EOL wishes disposition and options.   A detailed discussion was had today regarding advanced directives.  Concepts specific to code status, artifical feeding and hydration, IV antibiotics and rehospitalization were discussed.  The difference between an aggressive medical intervention path and a comfort care path was discussed.  Values and goals of care important to patient and family were attempted to be elicited.  Discussed limitations of medical  interventions to prolong quality of life in some situations and discussed the concept of human mortality.  Notes reviewed.  Discussed her current tenuous status, current care, and concerns for providing some care such as a cardiac cath with Shanda Bumps (legal guardian).  Discussed the recommended dysphagia/nectar thick diet recommendation, her baseline status, and acceptable QOL. Discussed what patient stated.      MOST form completed per DSS request; completed for full comfort care. Form was left in her chart for DSS to pick up and review.     SUMMARY OF RECOMMENDATIONS   MOST form completed and left in  chart for full comfort care to be picked up by DSS. Will need to be taken and reviewed by them, signed, and brought back prior to transition to comfort care by PMT team. DSS is aware if acute need for decision making arises, they will receive a call from CCM.   Patient is hospice appropriate if DSS chooses.   Prognosis:   Very poor.       Primary Diagnoses: Present on Admission: . Sepsis (HCC) . Bradycardia, sinus, persistent, severe   I have reviewed the medical record, interviewed the patient and family, and examined the patient. The following aspects are pertinent.  Past Medical History:  Diagnosis Date  . Arthritis   . Bradycardia    seen in 2018 by Dr. Graciela Husbands (cardiology/electrophysiology), no intervention recommended  . CKD (chronic kidney disease), stage III (HCC)   . 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 History of brain surgery 2001  . History of depression   . Hypertension   . Sinus node dysfunction w/ competing jxnl rhythm    a. 07/2012 Ech: EF >55%, no rwma, mild MR/TR/AI, mildly dil LA.  . Tobacco abuse    a. 05/2017 currently smokes 2 cigarettes/day.  . Vascular dementia [facility])    Social History   Socioeconomic History  . Marital status: Widowed    Spouse name: Not on file  . Number of children: Not on file  . Years of education: Not on file  . Highest education level: Not on file  Occupational History  . Not on file  Tobacco Use  . Smoking status: Current Every Day Smoker    Packs/day: 1.00    Years: 0.00    Pack years: 0.00    Types: Cigarettes  . Smokeless tobacco: Never Used  . Tobacco comment: currently smoking 2 cigarettes/day  Vaping Use  . Vaping Use: Never used  Substance and Sexual Activity  . Alcohol use: No  . Drug use: No  . Sexual activity: Not on file  Other Topics Concern  . Not on file  Social History Narrative   Lives @ assisted living.  Bed/wheelchair bound.   Social Determinants of Health    Financial Resource Strain: Not on file  Food Insecurity: Not on file  Transportation Needs: Not on file  Physical Activity: Not on file  Stress: Not on file  Social Connections: Not on file   Family History  Family history unknown: Yes   Scheduled Meds: . Chlorhexidine Gluconate Cloth  6 each Topical Q0600  . Chlorhexidine Gluconate Cloth  6 each Topical Daily  . clopidogrel  75 mg Oral Daily  . insulin aspart  0-6 Units Subcutaneous Q4H  . mupirocin ointment  1 application Nasal BID  . pantoprazole  40 mg Oral Daily   Continuous Infusions: . sodium chloride Stopped (12/26/20 1933)  . ceFEPime (MAXIPIME) IV 1 g (  12/27/20 0955)  . DOPamine 3 mcg/kg/min (12/27/20 1051)  . heparin 1,050 Units/hr (12/27/20 0900)   PRN Meds:.sodium chloride, acetaminophen **OR** acetaminophen, docusate sodium, polyethylene glycol Medications Prior to Admission:  Prior to Admission medications   Medication Sig Start Date End Date Taking? Authorizing Provider  acetaminophen (TYLENOL) 500 MG tablet Take 1,000 mg by mouth every 12 (twelve) hours. May take an additional every 6 hours if needed.   Yes [provider]  aspirin 81 MG tablet Take 81 mg by mouth daily.   Yes [provider]  atorvastatin (LIPITOR) 40 MG tablet Take 1 tablet (40 mg total) by mouth daily at 6 PM. Patient taking differently: Take 40 mg by mouth every evening. 06/08/17  Yes Shaune Pollack, MD  Calcium Carbonate-Vitamin D 600-400 MG-UNIT tablet Take 1 tablet by mouth daily.    Yes [provider]  Cholecalciferol (VITAMIN D3) 10 MCG (400 UNIT) tablet Take 800 Units by mouth daily.   Yes [provider]  citalopram (CELEXA) 40 MG tablet Take 40 mg by mouth daily. 12/06/20  Yes [provider]  clopidogrel (PLAVIX) 75 MG tablet Take 1 tablet (75 mg total) by mouth daily. 06/08/17  Yes Shaune Pollack, MD  enalapril (VASOTEC) 10 MG tablet Take 10 mg by mouth daily.   Yes [provider]   ferrous sulfate 325 (65 FE) MG tablet Take 325 mg by mouth 2 (two) times daily with a meal.   Yes [provider]  fexofenadine (ALLEGRA) 180 MG tablet Take 180 mg by mouth daily.   Yes [provider]  gabapentin (NEURONTIN) 300 MG capsule Take 600 mg by mouth 3 (three) times daily.    Yes [provider]  hydrALAZINE (APRESOLINE) 50 MG tablet Take 50 mg by mouth 3 (three) times daily.   Yes [provider]  pantoprazole (PROTONIX) 20 MG tablet Take 20 mg by mouth daily.   Yes [provider]  polyethylene glycol (MIRALAX / GLYCOLAX) packet Take 17 g by mouth daily.   Yes [provider]  ammonium lactate (AMLACTIN) 12 % cream Apply topically as needed for dry skin (LEFT FOOT).    [provider]  cephALEXin (KEFLEX) 500 MG capsule Take 1 capsule (500 mg total) by mouth 2 (two) times daily. Patient not taking: No sig reported 03/04/19   Jene Every, MD  cloNIDine (CATAPRES - DOSED IN MG/24 HR) 0.1 mg/24hr patch Place 0.1 mg onto the skin once a week.    [provider]  nystatin cream (MYCOSTATIN) Apply 1 application topically 2 (two) times daily. Until redness is healed Patient not taking: Reported on 12/24/2020    [provider]   Allergies  Allergen Reactions  . Ultram [Tramadol]     vomiting   Review of Systems  All other systems reviewed and are negative.   Physical Exam Pulmonary:     Effort: Pulmonary effort is normal.  Skin:    General: Skin is warm and dry.  Neurological:     Mental Status: She is alert.     Vital Signs: BP 109/64 (BP Location: Right Arm)   Pulse 92   Temp 99.68 F (37.6 C) (Bladder)   Resp (!) 21   Ht 5\' 6"  (1.676 m)   Wt 55.7 kg   SpO2 96%   BMI 19.82 kg/m  Pain Scale: CPOT   Pain Score: 0-No pain   SpO2: SpO2: 96 % O2 Device:SpO2: 96 % O2 Flow Rate: .O2 Flow Rate (L/min): 2  L/min  IO: Intake/output summary:   Intake/Output Summary (Last 24 hours) at  12/27/2020 1126 Last data filed at 12/27/2020 0900 Gross per 24 hour  Intake 721.62 ml  Output 650 ml  Net 71.62 ml    LBM: Last BM Date: 12/27/20 Baseline Weight: Weight: 55.7 kg Most recent weight: Weight: 55.7 kg       Time In: 10:50 Time Out:11:20 Time Total: 30 min Greater than 50%  of this time was spent counseling and coordinating care related to the above assessment and plan.  Signed by: Morton Stallrystal Damek Ende, NP   Please contact Palliative Medicine Team phone at [phone] for questions and concerns.  For individual provider: See Loretha StaplerAmion

## 2020-12-27 NOTE — Progress Notes (Addendum)
SLP Cancellation Note  Patient Details Name: Joanna Stone MRN: [id number] DOB: 1940/10/28   Cancelled treatment:       Reason Eval/Treat Not Completed: Medical issues which prohibited therapy;Patient's level of consciousness;Patient not medically ready (cbart reviewed; consulted NSG). Per chart and NSG, pt with CARDIAC ARREST AND CODE BLUE this morning. Pt remains unresponsive and will not open eyes to command per MD note. Also per MD note, pt with Progressive multiorgan failure with a very high probablity of a very minimal chance of meaningful recovery despite all aggressive and optimal medical therapy. Patient is in the Dying Process associated with Suffering.The Legal gaurdian has consented and agreed to DNR/DNI w/ potential transition to comfort care per chart notes.  Recommend no po's unless pt is fully alert/awake to safely participate in oral intake w/ NSG. Must give 100% supervision w/ any oral intake and ensure oral clearing. Strict aspiration precautions if giving any po's. NSG /MD to reconsult ST services if new needs arise. ST services will sign off at this time secondary to decline in status post code blue this morning. Recommend frequent oral care for hygiene and stimulation of swallowing. NSG updated.        Jerilynn Som, MS, CCC-SLP Speech Language Pathologist Rehab Services [phone] [facility] 12/27/2020, 3:01 PM

## 2020-12-27 NOTE — Consult Note (Signed)
ANTICOAGULATION CONSULT NOTE  Pharmacy Consult for heparin Indication: chest pain/ACS  Patient Measurements: Height: 5\' 6"  (167.6 cm) Weight: 55.7 kg (122 lb 12.8 oz) IBW/kg (Calculated) : 59.3 Heparin Dosing Weight: 55.7  Vital Signs: Temp: 100.58 F (38.1 C) (04/18 0200) Temp Source: Bladder (04/18 0200) BP: 116/84 (04/18 0200) Pulse Rate: 93 (04/18 0200)  Labs: Recent Labs    12/24/20 1929 12/24/20 2155 12/25/20 0419 12/25/20 0425 12/25/20 1019 12/25/20 1757 12/26/20 0457 12/26/20 0506 12/26/20 1258 12/26/20 1510 12/26/20 1648 12/26/20 1814 12/26/20 2248 12/26/20 2253 12/27/20 0501  HGB  --    < >  --    < >  --    < > 10.1*  --   --   --  10.0*  --  10.0*  --  9.6*  HCT  --    < >  --    < >  --    < > 29.6*  --   --   --  29.9*  --  30.7*  --  28.5*  PLT  --    < >  --    < >  --    < > 187  --   --   --  178  --   --   --  172  APTT 34  --  36  --   --   --   --   --   --   --   --   --   --   --   --   LABPROT 16.5*  --  17.2*  --   --   --   --   --   --   --   --   --   --   --   --   INR 1.3*  --  1.4*  --   --   --   --   --   --   --   --   --   --   --   --   HEPARINUNFRC  --   --   --   --   --    < >  --    < >  --  0.43  --   --   --  0.33 0.35  CREATININE  --    < >  --    < > 4.08*  --  3.96*  --   --   --   --   --   --   --  3.69*  TROPONINIHS  --    < >  --   --   --   --   --   --  >27,000*  --   --  >27,000* >27,000*  --   --    < > = values in this interval not displayed.    Estimated Creatinine Clearance: 10.9 mL/min (A) (by C-G formula based on SCr of 3.69 mg/dL (H)).   Medications:  No anticoagulants prior to admission per chart review   Assessment: 80 yo F presented with slurred speech and lethargy, last known well time unknown. PMH includes bradycardia, dementia, stroke, seizures, HTN, CKD, and CVA. EKG concerning for STEMI. Pharmacy consulted to dose heparin for ACS/STEMI. Heparin was stopped due to hematochezia but given elevated  cardiac risk it is being restarted. HS Tn elevation  Date Time HL Rate/comment 4/16 1757 0.11 600 units/hr - subtherapeutic 4/17 0506 0.18 Increased to 1050 units/hr 4/17 1510  0.43 Continue 1050 units/hr 4/17 2253 0.33 Continue 1050 units/hr.  4/18 0501 0.35 Continue 1050 units/hr  Goal of Therapy:  Heparin level 0.3-0.5 units/ml Monitor platelets by anticoagulation protocol: Yes   Plan:  Heparin level is therapeutic. Will continue heparin infusion rate at 1050 units/hr. Recheck heparin and CBC with AM labs.  Monitor Hgb and Plt. Plan to aim for a goal of 0.3 to 0.5 due to issue of bleeding.   Ronnald Ramp, PharmD 12/27/2020 6:00 AM

## 2020-12-27 NOTE — Progress Notes (Signed)
Pt alert to self, calm and resting appropriately. Has occasional periods of audible tachypnea but denies any pain or problem on further investigation.  Pt had 1 medium sized dark tarry stool around 2200- NP Cheryll Cockayne made aware and orders for a H&H placed. No major changes noted from previous collection. Bowel sounds audible in all 4 quads- denies pain on palpation.   VS as follows:  T- 38.2C/ bladder HR- 82 Afib CVP- 14 BP- 123/83 O2- 100 2LNC  RR- 22 tachypnea

## 2020-12-27 NOTE — Progress Notes (Signed)
PHARMACY CONSULT NOTE  Pharmacy Consult for Electrolyte Monitoring and Replacement   Recent Labs: Potassium (mmol/L)  Date Value  12/27/2020 3.8  08/01/2012 4.6   Magnesium (mg/dL)  Date Value  02/54/2706 2.3  07/31/2012 2.2   Calcium (mg/dL)  Date Value  23/76/2831 8.0 (L)   Calcium, Total (mg/dL)  Date Value  51/76/1607 8.8   Albumin (g/dL)  Date Value  37/06/6268 2.5 (L)  07/31/2012 4.0   Phosphorus (mg/dL)  Date Value  48/54/6270 6.4 (H)   Sodium (mmol/L)  Date Value  12/27/2020 142  08/01/2012 140   Corrected Ca: 9.2 mg/dL  Assessment: 80 yo F presented with slurred speech and lethargy, last known well time unknown. PMH includes bradycardia, dementia, stroke, seizures, HTN, CKD, and CVA. EKG concerning for STEMI. Pharmacy consulted to dose heparin for ACS/STEMI. Pharmacy has been asked to review and replace electrolytes while this patient is in the ICU.  Goal of Therapy:  Potassium 4.0 - 5.1 mmol/L Magnesium 2.0 - 2.4 mg/dL All Other Electrolytes WNL  Plan:   K 3.8 - replace with PO KCl x1  Phos 6.4 - likely due to poor renal function, continue to monitor and consider Phos binder if continues to increase  Re-check electrolytes with am labs  Raiford Noble, PharmD Pharmacy Resident  12/27/2020 6:50 AM

## 2020-12-28 DIAGNOSIS — N189 Chronic kidney disease, unspecified: Secondary | ICD-10-CM | POA: Diagnosis not present

## 2020-12-28 DIAGNOSIS — R001 Bradycardia, unspecified: Secondary | ICD-10-CM | POA: Diagnosis not present

## 2020-12-28 DIAGNOSIS — N179 Acute kidney failure, unspecified: Secondary | ICD-10-CM | POA: Diagnosis not present

## 2020-12-28 LAB — BLOOD GAS, VENOUS
Acid-Base Excess: 1.3 mmol/L (ref 0.0–2.0)
Bicarbonate: 24.7 mmol/L (ref 20.0–28.0)
O2 Saturation: 77.7 %
Patient temperature: 37
pCO2, Ven: 34 mmHg — ABNORMAL LOW (ref 44.0–60.0)
pH, Ven: 7.47 — ABNORMAL HIGH (ref 7.250–7.430)
pO2, Ven: 39 mmHg (ref 32.0–45.0)

## 2020-12-28 LAB — COMPREHENSIVE METABOLIC PANEL
ALT: 73 U/L — ABNORMAL HIGH (ref 0–44)
AST: 76 U/L — ABNORMAL HIGH (ref 15–41)
Albumin: 2.6 g/dL — ABNORMAL LOW (ref 3.5–5.0)
Alkaline Phosphatase: 81 U/L (ref 38–126)
Anion gap: 15 (ref 5–15)
BUN: 110 mg/dL — ABNORMAL HIGH (ref 8–23)
CO2: 23 mmol/L (ref 22–32)
Calcium: 8.2 mg/dL — ABNORMAL LOW (ref 8.9–10.3)
Chloride: 108 mmol/L (ref 98–111)
Creatinine, Ser: 4.08 mg/dL — ABNORMAL HIGH (ref 0.44–1.00)
GFR, Estimated: 11 mL/min — ABNORMAL LOW (ref >60)
Glucose, Bld: 162 mg/dL — ABNORMAL HIGH (ref 70–99)
Potassium: 4.2 mmol/L (ref 3.5–5.1)
Sodium: 146 mmol/L — ABNORMAL HIGH (ref 135–145)
Total Bilirubin: 0.8 mg/dL (ref 0.3–1.2)
Total Protein: 6.1 g/dL — ABNORMAL LOW (ref 6.5–8.1)

## 2020-12-28 LAB — CBC
HCT: 31.6 % — ABNORMAL LOW (ref 36.0–46.0)
Hemoglobin: 10.5 g/dL — ABNORMAL LOW (ref 12.0–15.0)
MCH: 32.2 pg (ref 26.0–34.0)
MCHC: 33.2 g/dL (ref 30.0–36.0)
MCV: 96.9 fL (ref 80.0–100.0)
Platelets: 185 10*3/uL (ref 150–400)
RBC: 3.26 MIL/uL — ABNORMAL LOW (ref 3.87–5.11)
RDW: 13.8 % (ref 11.5–15.5)
WBC: 12 10*3/uL — ABNORMAL HIGH (ref 4.0–10.5)
nRBC: 1.2 % — ABNORMAL HIGH (ref 0.0–0.2)

## 2020-12-28 LAB — GLUCOSE, CAPILLARY
Glucose-Capillary: 157 mg/dL — ABNORMAL HIGH (ref 70–99)
Glucose-Capillary: 168 mg/dL — ABNORMAL HIGH (ref 70–99)
Glucose-Capillary: 147 mg/dL — ABNORMAL HIGH (ref 70–99)
Glucose-Capillary: 192 mg/dL — ABNORMAL HIGH (ref 70–99)

## 2020-12-28 LAB — PHOSPHORUS: Phosphorus: 6.4 mg/dL — ABNORMAL HIGH (ref 2.5–4.6)

## 2020-12-28 LAB — MAGNESIUM: Magnesium: 2.5 mg/dL — ABNORMAL HIGH (ref 1.7–2.4)

## 2020-12-28 LAB — HEPARIN LEVEL (UNFRACTIONATED): Heparin Unfractionated: 0.3 IU/mL (ref 0.30–0.70)

## 2020-12-28 LAB — PROCALCITONIN: Procalcitonin: 4.23 ng/mL

## 2020-12-28 MED ORDER — DIAZEPAM 5 MG/ML IJ SOLN: 2.5000 mg | INTRAMUSCULAR | Status: DC | PRN

## 2020-12-28 MED ORDER — HYDROMORPHONE HCL 1 MG/ML IJ SOLN
0.5000 mg | INTRAMUSCULAR | Status: DC | PRN
  Administered 2020-12-29 – 2020-12-30 (×4): 0.5 mg via INTRAVENOUS
  Filled 2020-12-28 (×4): qty 1

## 2020-12-28 NOTE — Progress Notes (Signed)
Patient went into junctional rhythm with a rate of 40-60s around 15:00 PM, with a 4.91 second pause at 1512. Patient continued to be in a junctional rhythm with rate between 30s-60s, BP stable 90-100s/60s (MAPs 60-70). Patient assessed at this time, denies any pain or discomfort.   Dr. Mikki Harbor reached out to DSS worker to update on status change and to express concern for patient prognosis. Per Dr. Mikki Harbor, patient to be transitioned to comfort care measures only at this time.

## 2020-12-28 NOTE — Plan of Care (Signed)
Spoke with Theodis Shove concerning patient. Informed her that patient seems like she is getting worse with persistent bradyarrhythmias. She is aware and would like to pursue comfort measures for patient. She has spoken with her supervisor and they are both aware and agreeable to this. Will make patient comfort care.   Vertis Kelch DO Internal Medicine/Pediatrics Pulmonary and Critical Care Fellow PGY-7

## 2020-12-28 NOTE — Progress Notes (Addendum)
NAME:  Joanna Stone, MRN:  [id number], DOB:  June 28, 1941, LOS: 4 ADMISSION DATE:  12/24/2020 Joanna Stone is a 80 y.o. female from SNF here for inferior STEMI (medically managed), septic shock 2/2 suspected CAP, profound lactic acidosis, AKI and elevated LFTs. Troponin and BNP significantly elevated. EKG in last 24 hours shows evolution of inferior STEMI. She required low-dose dopamine with improvement in heart rate and hypotension. Lactic acid peaked at 9. Blood gas showed respiratory compensation. Procalcitonin elevated at 4.9. Broad spectrum antibiotics initiated in ED. Imaging obtained includes CXR suggesting L>R bibasilar opacities and cardiomegaly, RUQ abd suggesting fatty liver and CT a/p showing small amount of free pelvic fluid without evidence for rupture or perforation. U/A shows moderate leuks, many bacteria and mucus. Echocardiogram completed showing EF of 25% with wall motion abnormalities.  Significant labs: Troponin elevated > 27,000 x 2, BNP elevated at 3981.5, Transaminitis: AST 411, ALT 95, lactic acidosis 6.1, leukocytosis 26, AKI with BUN/Cr: 74/3.91, serum CO2 17.  UA consistent with UTI: + Leukocytes, + many bacteria, > 50 WBCs.  Chest x-ray consistent with developing RLL pneumonia & mild pulmonary edema versus aspiration.  PCCM consulted for admission due to patient's complex picture. 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   Significant Hospital Events: Including procedures, antibiotic start and stop dates in addition to other pertinent events    4/15 Admit to SDU for monitoring on heparin drip with N-STEMI & Sepsis  4/16 remained on dopamine for cardiogenic and septic shock; urine cxs positive for E coli  4/17 remains on pressors, lasix as tolerated  4/18 remains on pressors    Interim History / Subjective:  EF 25% +STEMI-cardiology consulted S/p cardiac  arrest  Trop>27K Progressive renal failure On heparin and amiodarone infusions   Objective   Blood pressure 90/63, pulse 77, temperature 98.96 F (37.2 C), resp. rate (!) 30, height 5\' 6"  (1.676 m), weight 55.7 kg, SpO2 100 %. CVP:  [4 mmHg-27 mmHg] 15 mmHg      Intake/Output Summary (Last 24 hours) at 12/28/2020 0900 Last data filed at 12/27/2020 2000 Gross per 24 hour  Intake 599.7 ml  Output 640 ml  Net -40.3 ml   Filed Weights   12/24/20 1503  Weight: 55.7 kg      Labs/imaging that I have personally reviewed  (right click and "Reselect all SmartList Selections" daily)  4/16 TTE ventricular ejection fraction, by estimation, is 25 to 30%.   12/24/20 CT head w/o contrast >> No acute intracranial abnormality.  12/24/20 RUQ 12/26/20 >> solitary RIGHT kidney with evidence of chronic renal disease, no hydronephrosis  CXR 12/24/2020: Increased central vascular congestion with developing right basilar consolidation.  Possibly mild pulmonary edema versus aspiration   REVIEW OF SYSTEMS Limited No pain today Awake, disoriented  PHYSICAL EXAMINATION:  GENERAL:critically ill appearing, HEAD: Normocephalic, atraumatic.  EYES: Pupils equal, round, reactive to light.  No scleral icterus.  MOUTH: Moist mucosal membrane. NECK: Supple. PULMONARY: +rhonchi,  CARDIOVASCULAR: S1 and S2. Regular rate and rhythm. No murmurs, rubs, or gallops.  GASTROINTESTINAL: Soft, nontender, -distended. Positive bowel sounds.  MUSCULOSKELETAL: No swelling, clubbing, or edema.  NEUROLOGIC: awake SKIN:intact,warm,dry       ASSESSMENT AND PLAN SYNOPSIS  80 yo female with acute STEMI with acute systolic heart failure and cardiogenic and septic  shock with severe acdiosis Severe Bradycardia with sinus node dysfunction   Septic shock with E coli with aspiration  pneumonia Pressors as needed Weaned off dopamine  CARDIAC FAILURE-acute STEMI and acute systolic dysfunction Follow up cardiology  recs Continue AC Weaned off dopamine  CARDIAC ICU monitoring   ACUTE KIDNEY INJURY/Renal Failure -continue Foley Catheter-assess need -Avoid nephrotoxic agents -Follow urine output, BMP -Ensure adequate renal perfusion, optimize oxygenation -Renal dose medications  Intake/Output Summary (Last 24 hours) at 12/28/2020 0913 Last data filed at 12/27/2020 2000 Gross per 24 hour  Intake 599.7 ml  Output 640 ml  Net -40.3 ml     INFECTIOUS DISEASE -continue antibiotics as prescribed -follow up cultures  ENDO - ICU hypoglycemic\Hyperglycemia protocol -check FSBS per protocol   GI GI PROPHYLAXIS as indicated  NUTRITIONAL STATUS DIET-->TF's as tolerated Constipation protocol as indicated   ELECTROLYTES -follow labs as needed -replace as needed -pharmacy consultation and following    Best practice (right click and "Reselect all SmartList Selections" daily)  Diet:  Oral Pain/Anxiety/Delirium protocol (if indicated): No VAP protocol (if indicated): Not indicated DVT prophylaxis: Systemic AC GI prophylaxis: PPI Glucose control:  SSI Yes Central venous access:  Yes, and it is still needed Arterial line:  N/A Foley:  Yes, and it is still needed Mobility:  bed rest  PT consulted: N/A hold given recent MI1 Last date of multidisciplinary goals of care discussion 12/24/20 Code Status:  full code Disposition: ICU    Labs   CBC: Recent Labs  Lab 12/24/20 1540 12/24/20 2155 12/25/20 1757 12/26/20 0228 12/26/20 0457 12/26/20 1648 12/26/20 2248 12/27/20 0501 12/28/20 0424  WBC 26.0*   < > 17.0*  --  16.4* 15.1*  --  13.5* 12.0*  NEUTROABS 22.9*  --   --   --   --   --   --   --   --   HGB 11.4*   < > 9.7*   < > 10.1* 10.0* 10.0* 9.6* 10.5*  HCT 35.3*   < > 29.1*   < > 29.6* 29.9* 30.7* 28.5* 31.6*  MCV 99.2   < > 95.4  --  94.3 95.5  --  96.3 96.9  PLT 227   < > 198  --  187 178  --  172 185   < > = values in this interval not displayed.    Basic Metabolic  Panel: Recent Labs  Lab 12/24/20 1540 12/25/20 0124 12/25/20 0813 12/25/20 1019 12/26/20 0457 12/27/20 0501 12/28/20 0424  NA 137 144 138 139 140 142 146*  K 4.9 5.3* 5.0 4.9 4.2 3.8 4.2  CL 105 103 100 100 101 103 108  CO2 17* 23 24 24 26 24 23   GLUCOSE 208* 134* 124* 152* 140* 141* 162*  BUN 74* 71* 78* 83* 89* 97* 110*  CREATININE 3.91* 3.83* 3.96* 4.08* 3.96* 3.69* 4.08*  CALCIUM 9.0 7.9* 8.2* 8.1* 7.9* 8.0* 8.2*  MG 2.4 2.1  --   --  2.3 2.3 2.5*  PHOS  --  6.2*  --   --  5.9* 6.4* 6.4*   GFR: Estimated Creatinine Clearance: 9.8 mL/min (A) (by C-G formula based on SCr of 4.08 mg/dL (H)). Recent Labs  Lab 12/24/20 1929 12/24/20 2024 12/25/20 0124 12/25/20 0425 12/25/20 1000 12/25/20 1400 12/25/20 1657 12/25/20 1757 12/26/20 0457 12/26/20 1229 12/26/20 1648 12/27/20 0501 12/27/20 0646 12/28/20 0424  PROCALCITON 3.57  --  4.90  --   --   --   --   --   --   --   --   --  4.79 4.23  WBC  --   --  23.5*   < >  --   --   --    < > 16.4*  --  15.1* 13.5*  --  12.0*  LATICACIDVEN  --    < > 6.9*  --  2.3* 2.3* 2.3*  --   --  2.1*  --   --   --   --    < > = values in this interval not displayed.    Liver Function Tests: Recent Labs  Lab 12/24/20 1540 12/25/20 0124 12/26/20 0457 12/27/20 0501 12/28/20 0424  AST 411* 355* 183* 93* 76*  ALT 95* 136* 103* 73* 73*  ALKPHOS 92 93 78 70 81  BILITOT 0.8 0.6 0.8 0.9 0.8  PROT 7.2 5.2* 5.8* 6.1* 6.1*  ALBUMIN 3.6 2.5* 2.7* 2.5* 2.6*   Recent Labs  Lab 12/24/20 1929  LIPASE 23   Recent Labs  Lab 12/25/20 0220  AMMONIA 11    ABG    Component Value Date/Time   PHART 7.37 12/25/2020 0107   PCO2ART <19.0 (LL) 12/25/2020 0107   PO2ART 88 12/25/2020 0107   HCO3 24.7 12/25/2020 1020   ACIDBASEDEF 13.7 (H) 12/25/2020 0107   O2SAT 77.7 12/25/2020 1020     Coagulation Profile: Recent Labs  Lab 12/24/20 1929 12/25/20 0419  INR 1.3* 1.4*    Cardiac Enzymes: No results for input(s): CKTOTAL, CKMB,  CKMBINDEX, TROPONINI in the last 168 hours.  HbA1C: Hgb A1c MFr Bld  Date/Time Value Ref Range Status  12/25/2020 02:20 AM 5.7 (H) 4.8 - 5.6 % Final    Comment:    (NOTE)         Prediabetes: 5.7 - 6.4         Diabetes: >6.4         Glycemic control for adults with diabetes: <7.0   06/04/2017 04:52 AM 5.1 4.8 - 5.6 % Final    Comment:    (NOTE) Pre diabetes:          5.7%-6.4% Diabetes:              >6.4% Glycemic control for   <7.0% adults with diabetes     CBG: Recent Labs  Lab 12/27/20 1608 12/27/20 1920 12/27/20 2318 12/28/20 0410 12/28/20 0725  GLUCAP 151* 193* 170* 157* 168*    Allergies Allergies  Allergen Reactions  . Ultram [Tramadol]     vomiting       DVT/GI PRX  assessed I Assessed the need for Labs I Assessed the need for Foley I Assessed the need for Central Venous Line Family Discussion when available I Assessed the need for Mobilization I made an Assessment of medications to be adjusted accordingly Safety Risk assessment completed  CASE DISCUSSED IN MULTIDISCIPLINARY ROUNDS WITH ICU TEAM     Critical Care Time devoted to patient care services described in this note is 50 minutes.  Very poor chance of meaningful recovery Overall, patient is critically ill, prognosis is guarded.    Patient is DNR/DNi status  Lucie Leather, M.D.  [facility]  Medical Director [facility] Medical Director [facility] Cardio-Pulmonary Department

## 2020-12-28 NOTE — Progress Notes (Signed)
PHARMACY CONSULT NOTE  Pharmacy Consult for Electrolyte Monitoring and Replacement   Recent Labs: Potassium (mmol/L)  Date Value  12/28/2020 4.2  08/01/2012 4.6   Magnesium (mg/dL)  Date Value  84/53/6468 2.5 (H)  07/31/2012 2.2   Calcium (mg/dL)  Date Value  11/30/2246 8.2 (L)   Calcium, Total (mg/dL)  Date Value  25/00/3704 8.8   Albumin (g/dL)  Date Value  88/89/1694 2.6 (L)  07/31/2012 4.0   Phosphorus (mg/dL)  Date Value  50/38/8828 6.4 (H)   Sodium (mmol/L)  Date Value  12/28/2020 146 (H)  08/01/2012 140    Assessment: 80 yo F presented with slurred speech and lethargy, last known well time unknown. PMH includes bradycardia, dementia, stroke, seizures, HTN, CKD, and CVA. EKG concerning for STEMI. Pharmacy consulted to dose heparin for ACS/STEMI. Pharmacy has been asked to review and replace electrolytes while this patient is in the ICU.  Goal of Therapy:  Potassium 4.0 - 5.1 mmol/L Magnesium 2.0 - 2.4 mg/dL All Other Electrolytes WNL  Plan:   Renal function worsening, no replacement indicated  Continue to follow  Pricilla Riffle, PharmD Clinical Pharmacist 12/28/2020 11:49 AM

## 2020-12-28 NOTE — Progress Notes (Addendum)
Daily Progress Note   Patient Name: Joanna Stone       Date: 12/28/2020 DOB: 05-Sep-1941  Age: 80 y.o. MRN#: [id number] Attending Physician: Erin Fulling, MD Primary Care Physician: Patient, No Pcp Per (Inactive) Admit Date: 12/24/2020  Reason for Consultation/Follow-up: Establishing goals of care  Subjective: Patient is resting in bed. Staff is helping her with lunch. She perseverates on needing to urinate as she has a foley in place. Updated staff at bedside on conversation she and I had yesterday about wanting to focus just on comfort. Patient then interjected "well everybody dies!" Asked if she just wants to have care to keep her comfortable until she dies and she said "yes".   Spoke with Shanda Bumps with APS. Discussed her diagnoses and poor prognosis. She states she will speak with her team, but is leaning toward hospice facility placement. She does not want the patient to suffer. MOST form not fully completed/scanned as APS is unsure of desire for full comfort care at this time as CCM is  treating the treatable.   If APS decides on hospice facility placement, she is a candidate and I recommend reaching out to hospice liason for D/C to their facility.    Length of Stay: 4  Current Medications: Scheduled Meds:  . Chlorhexidine Gluconate Cloth  6 each Topical Q0600  . Chlorhexidine Gluconate Cloth  6 each Topical Daily  . clopidogrel  75 mg Oral Daily  . insulin aspart  0-6 Units Subcutaneous Q4H  . mupirocin ointment  1 application Nasal BID  . pantoprazole  40 mg Oral Daily    Continuous Infusions: . sodium chloride 10 mL/hr at 12/28/20 0800  . amiodarone 30 mg/hr (12/28/20 0800)  . heparin 1,050 Units/hr (12/28/20 0800)    PRN Meds: sodium chloride, acetaminophen **OR**  acetaminophen, docusate sodium, polyethylene glycol  Physical Exam Pulmonary:     Effort: Pulmonary effort is normal.  Neurological:     Mental Status: She is alert.             Vital Signs: BP (!) 122/110   Pulse (!) 36   Temp 100.04 F (37.8 C)   Resp (!) 34   Ht 5\' 6"  (1.676 m)   Wt 55.7 kg   SpO2 100%   BMI 19.82 kg/m  SpO2: SpO2: 100 % O2  Device: O2 Device: Nasal Cannula O2 Flow Rate: O2 Flow Rate (L/min): 2 L/min  Intake/output summary:   Intake/Output Summary (Last 24 hours) at 12/28/2020 1420 Last data filed at 12/28/2020 1000 Gross per 24 hour  Intake 1075.53 ml  Output 90 ml  Net 985.53 ml   LBM: Last BM Date: 12/28/20 Baseline Weight: Weight: 55.7 kg Most recent weight: Weight: 55.7 kg           Patient Active Problem List   Diagnosis Date Noted  . Acute kidney injury superimposed on chronic kidney disease (HCC) 12/25/2020  . Pressure injury of skin 12/25/2020  . Sepsis (HCC) 12/24/2020  . NSTEMI (non-ST elevated myocardial infarction) (HCC)   . Goals of care, counseling/discussion   . Palliative care encounter   . TIA (transient ischemic attack)   . Atrial tachycardia (HCC)   . Bradycardia, sinus, persistent, severe 06/03/2017  . Essential hypertension 07/03/2013  . sinus node dysfunction]   . Cerebrovascular accident [facility])     Palliative Care Assessment & Plan    Recommendations/Plan:  APS leaning towards hospice facility placement. She is a candidate and if they decide this route, would recommend referral to hospice facility liaison.    Code Status:    Code Status Orders  (From admission, onward)         Start     Ordered   12/27/20 1242  Do not attempt resuscitation (DNR)  Continuous       Question Answer Comment  In the event of cardiac or respiratory ARREST Do not call a "code blue"   In the event of cardiac or respiratory ARREST Do not perform Intubation, CPR, defibrillation or ACLS   In the event of cardiac or respiratory  ARREST Use medication by any route, position, wound care, and other measures to relive pain and suffering. May use oxygen, suction and manual treatment of airway obstruction as needed for comfort.      12/27/20 1242        Code Status History    Date Active Date Inactive Code Status Order ID Comments User Context   12/24/2020 1856 12/27/2020 1242 Full Code [id number]  Jimmye Norman, NP ED   06/03/2017 1952 06/08/2017 2047 Full Code [id number]  Ramonita Lab, MD Inpatient   Advance Care Planning Activity       Prognosis:  Poor    Care plan was discussed with CCM  Thank you for allowing the Palliative Medicine Team to assist in the care of this patient.   Total Time 25 min Prolonged Time Billed  no      Greater than 50%  of this time was spent counseling and coordinating care related to the above assessment and plan.  Morton Stall, NP  Please contact Palliative Medicine Team phone at [phone] for questions and concerns.

## 2020-12-28 NOTE — Consult Note (Signed)
ANTICOAGULATION CONSULT NOTE  Pharmacy Consult for heparin Indication: chest pain/ACS  Patient Measurements: Height: 5\' 6"  (167.6 cm) Weight: 55.7 kg (122 lb 12.8 oz) IBW/kg (Calculated) : 59.3 Heparin Dosing Weight: 55.7  Vital Signs: Temp: 98.96 F (37.2 C) (04/19 0500) Temp Source: Bladder (04/19 0410) BP: 90/63 (04/19 0500) Pulse Rate: 77 (04/19 0500)  Labs: Recent Labs    12/25/20 1019 12/25/20 1757 12/26/20 0457 12/26/20 0506 12/26/20 1648 12/26/20 1814 12/26/20 2248 12/26/20 2253 12/27/20 0501 12/27/20 0646 12/28/20 0424  HGB  --    < > 10.1*  --  10.0*  --  10.0*  --  9.6*  --  10.5*  HCT  --    < > 29.6*  --  29.9*  --  30.7*  --  28.5*  --  31.6*  PLT  --    < > 187  --  178  --   --   --  172  --  185  HEPARINUNFRC  --    < >  --    < >  --   --   --  0.33 0.35  --  0.30  CREATININE 4.08*  --  3.96*  --   --   --   --   --  3.69*  --   --   TROPONINIHS  --   --   --    < >  --  >27,000* >27,000*  --   --  >27,000*  --    < > = values in this interval not displayed.    Estimated Creatinine Clearance: 10.9 mL/min (A) (by C-G formula based on SCr of 3.69 mg/dL (H)).   Medications:  No anticoagulants prior to admission per chart review   Assessment: 80 yo F presented with slurred speech and lethargy, last known well time unknown. PMH includes bradycardia, dementia, stroke, seizures, HTN, CKD, and CVA. EKG concerning for STEMI. Pharmacy consulted to dose heparin for ACS/STEMI. Heparin was stopped due to hematochezia but given elevated cardiac risk it is being restarted. HS Tn elevation  Date Time HL Rate/comment 4/16 1757 0.11 600 units/hr - subtherapeutic 4/17 0506 0.18 Increased to 1050 units/hr 4/17 1510 0.43 Continue 1050 units/hr 4/17 2253 0.33 Continue 1050 units/hr.  4/18 0501 0.35 Continue 1050 units/hr 4/19 0424 0.30 Continue 1050 units/hr  Goal of Therapy:  Heparin level 0.3-0.5 units/ml Monitor platelets by anticoagulation protocol: Yes    Plan:  Heparin level is therapeutic. Will continue heparin infusion rate at 1050 units/hr. Since HL is borderline, subtherapeutic, will recheck heparin in 12 hrs. Daily HL and CBC with AM labs.  Monitor Hgb and Plt. Plan to aim for a goal of 0.3 to 0.5 due to issue of bleeding.   5/19, PharmD, [facility] 12/28/2020 5:58 AM

## 2020-12-29 ENCOUNTER — Encounter: Payer: Self-pay | Admitting: Internal Medicine

## 2020-12-29 DIAGNOSIS — R6521 Severe sepsis with septic shock: Secondary | ICD-10-CM

## 2020-12-29 LAB — CULTURE, BLOOD (ROUTINE X 2)
Culture: NO GROWTH
Culture: NO GROWTH
Special Requests: ADEQUATE
Special Requests: ADEQUATE

## 2020-12-29 NOTE — TOC Initial Note (Signed)
Transition of Care [facility]) - Initial/Assessment Note    Patient Details  Name: Joanna Stone MRN: [id number] Date of Birth: 1941-01-17  [facility] of [facility]) CM/SW Contact:    Allayne Butcher, RN Phone Number: 12/29/2020, 3:18 PM  Clinical Narrative:                 Patient admitted to the hospital with sepsis.  Patient is from [facility].  Patient has a legal guardian with [place] [facility].  Melburn Hake is the contact person.  DSS has decided on comfort measures and hospice home placement as they do not want the patient to suffer.   Residential hospice referral given to [facility] with [facility].   Expected Discharge Plan: Hospice Medical Facility Barriers to Discharge: Hospice Bed not available   Patient Goals and CMS Choice Patient states their goals for this hospitalization and ongoing recovery are:: DSS guardian has decided on comfort measures- they do not want the patient to suffer CMS Medicare.gov Compare Post Acute Care list provided to:: Legal Guardian Choice offered to / list presented to : [facility] POA / Guardian  Expected Discharge Plan and Services Expected Discharge Plan: Hospice Medical Facility In-house Referral: Hospice / Palliative Care Discharge Planning Services: CM Consult Post Acute Care Choice: Hospice Living arrangements for the past 2 months: Assisted Living Facility                 DME Arranged: N/A DME Agency: NA       HH Arranged: NA          Prior Living Arrangements/Services Living arrangements for the past 2 months: Assisted Living Facility Lives with:: Facility Resident Patient language and need for interpreter reviewed:: Yes Do you feel safe going back to the place where you live?: Yes      Need for Family Participation in Patient Care: Yes (Comment) Care giver support system in place?: Yes (comment) (DSS)   Criminal Activity/Legal Involvement Pertinent to Current Situation/Hospitalization: No - Comment as needed  Activities of  Daily Living      Permission Sought/Granted Permission sought to share information with : Case Manager,Guardian,Facility Medical sales representative Permission granted to share information with : Yes, Verbal Permission Granted  Share Information with NAME: Melburn Hake  Permission granted to share info w AGENCY: [facility]  Permission granted to share info w Relationship: guardian  Permission granted to share info w Contact Information: [phone]  Emotional Assessment         Alcohol / Substance Use: Not Applicable Psych Involvement: No (comment)  Admission diagnosis:  Transaminitis [R74.01] NSTEMI (non-ST elevated myocardial infarction) (HCC) [I21.4] AKI (acute kidney injury) (HCC) [N17.9] Sepsis (HCC) [A41.9] Sepsis without acute organ dysfunction, due to unspecified organism [facility]) [A41.9] Patient Active Problem List   Diagnosis Date Noted  . Acute kidney injury superimposed on chronic kidney disease (HCC) 12/25/2020  . Pressure injury of skin 12/25/2020  . Sepsis (HCC) 12/24/2020  . NSTEMI (non-ST elevated myocardial infarction) (HCC)   . Goals of care, counseling/discussion   . Palliative care encounter   . TIA (transient ischemic attack)   . Atrial tachycardia (HCC)   . Bradycardia, sinus, persistent, severe 06/03/2017  . Essential hypertension 07/03/2013  . sinus node dysfunction]   . Cerebrovascular accident [facility])    PCP:  Patient, No Pcp Per (Inactive) Pharmacy:   [facility] - [place], [place] - [address] [place] [address] Phone: [phone] Fax: [phone]  [facility] - [place], [place] - [address] [place] [address] Phone: [phone] Fax: [phone]     Social Determinants of Health (SDOH) Interventions    Readmission Risk Interventions No flowsheet data found.

## 2020-12-29 NOTE — Progress Notes (Addendum)
Daily Progress Note   Patient Name: Joanna Stone       Date: 12/29/2020 DOB: May 05, 1941  Age: 80 y.o. MRN#: [id number] Attending Physician: Starleen Arms, MD Primary Care Physician: Patient, No Pcp Per (Inactive) Admit Date: 12/24/2020  Reason for Consultation/Follow-up: Establishing goals of care  Subjective: Patient is sitting up with bed, alert and smiling. She states she is enjoying her coffee. She states she is feeling unwell as she has generalized pain- medications in place for comfort. No other complaints. Per notes patient was made comfort care yesterday by CCM after speaking with DSS. She is currently awaiting D/C to hospice facility.     Length of Stay: 5  Current Medications: Scheduled Meds:    Continuous Infusions:   PRN Meds: acetaminophen **OR** acetaminophen, diazepam, HYDROmorphone (DILAUDID) injection  Physical Exam Pulmonary:     Effort: Pulmonary effort is normal.  Neurological:     Mental Status: She is alert.             Vital Signs: BP 107/63 (BP Location: Right Arm)   Pulse (!) 49   Temp 98.3 F (36.8 C) (Oral)   Resp (!) 22   Ht 5\' 6"  (1.676 m)   Wt 55.7 kg   SpO2 (!) 74%   BMI 19.82 kg/m  SpO2: SpO2: (!) 74 % O2 Device: O2 Device: Nasal Cannula O2 Flow Rate: O2 Flow Rate (L/min): 2 L/min  Intake/output summary:   Intake/Output Summary (Last 24 hours) at 12/29/2020 1155 Last data filed at 12/29/2020 1012 Gross per 24 hour  Intake 274.5 ml  Output 50 ml  Net 224.5 ml   LBM: Last BM Date: 12/28/20 Baseline Weight: Weight: 55.7 kg Most recent weight: Weight: 55.7 kg           Patient Active Problem List   Diagnosis Date Noted  . Acute kidney injury superimposed on chronic kidney disease (HCC) 12/25/2020  . Pressure injury  of skin 12/25/2020  . Sepsis (HCC) 12/24/2020  . NSTEMI (non-ST elevated myocardial infarction) (HCC)   . Goals of care, counseling/discussion   . Palliative care encounter   . TIA (transient ischemic attack)   . Atrial tachycardia (HCC)   . Bradycardia, sinus, persistent, severe 06/03/2017  . Essential hypertension 07/03/2013  . sinus node dysfunction]   . Cerebrovascular accident [facility])  Palliative Care Assessment & Plan    Recommendations/Plan:  Awaiting D/C to hospice facility.   Please use PRN medications as needed for comfort.   Code Status:    Code Status Orders  (From admission, onward)         Start     Ordered   12/27/20 1242  Do not attempt resuscitation (DNR)  Continuous       Question Answer Comment  In the event of cardiac or respiratory ARREST Do not call a "code blue"   In the event of cardiac or respiratory ARREST Do not perform Intubation, CPR, defibrillation or ACLS   In the event of cardiac or respiratory ARREST Use medication by any route, position, wound care, and other measures to relive pain and suffering. May use oxygen, suction and manual treatment of airway obstruction as needed for comfort.      12/27/20 1242        Code Status History    Date Active Date Inactive Code Status Order ID Comments User Context   12/24/2020 1856 12/27/2020 1242 Full Code [id number]  Jimmye Norman, NP ED   06/03/2017 1952 06/08/2017 2047 Full Code [id number]  Ramonita Lab, MD Inpatient   Advance Care Planning Activity       Prognosis:   < 2 weeks   Thank you for allowing the Palliative Medicine Team to assist in the care of this patient.   Total Time 15 min Prolonged Time Billed  no      Greater than 50%  of this time was spent counseling and coordinating care related to the above assessment and plan.  Morton Stall, NP  Please contact Palliative Medicine Team phone at [phone] for questions and concerns.

## 2020-12-29 NOTE — Progress Notes (Signed)
[facility] Room 116 [employer] [facility]) Hospital Liaison RN note:  Received request from Morton Stall, NP for interest in Hospice Home. Chart reviewed and eligibility was approved. Spoke with DSS Guardian, Theodis Shove to confirm interest and explain services. She verbalized understanding. Unfortunately, Hospice Home is not able to offer a room today. Hospital care team is aware. [facility] Liaison will continue to follow for room availability.  Please call with any hospice related questions or concerns.  Thank you for the opportunity to participate in this patient's care.  Cyndra Numbers, RN [facility] Liaison  [phone]

## 2020-12-29 NOTE — TOC Progression Note (Signed)
Transition of Care [facility]) - Progression Note    Patient Details  Name: Joanna Stone MRN: [id number] Date of Birth: 12/21/1940  [facility] CM/SW Contact  Allayne Butcher, RN Phone Number: 12/29/2020, 3:19 PM  Clinical Narrative:    Hospice facility does not have a bed available today.    Expected Discharge Plan: Hospice Medical Facility Barriers to Discharge: Hospice Bed not available  Expected Discharge Plan and Services Expected Discharge Plan: Hospice Medical Facility In-house Referral: Hospice / Palliative Care Discharge Planning Services: CM Consult Post Acute Care Choice: Hospice Living arrangements for the past 2 months: Assisted Living Facility                 DME Arranged: N/A DME Agency: NA       HH Arranged: NA           Social Determinants of Health (SDOH) Interventions    Readmission Risk Interventions No flowsheet data found.

## 2020-12-29 NOTE — Care Management Important Message (Signed)
Important Message  Patient Details  Name: YAMILI LICHTENWALNER MRN: [id number] Date of Birth: [birth year]   Medicare Important Message Given:  Other (see comment)  Patient is on Comfort Care and out respect for the patient and family no Important Message from [facility] given.  Olegario Messier A Hobie Kohles 12/29/2020, 7:21 AM

## 2020-12-29 NOTE — Progress Notes (Signed)
PROGRESS NOTE                                                                             PROGRESS NOTE                                                                                                                                                                                                             Patient Demographics:    Joanna Stone, is a 80 y.o. female, DOB - [birth year], HCW:[id number]  Outpatient Primary MD for the patient is Patient, No Pcp Per (Inactive)    LOS - 5  Admit date - 12/24/2020    Chief Complaint  Patient presents with  . Fatigue  . Aphasia       Brief Narrative   Ms.Pongratz is a5 [facility] here for inferior STEMI (medically managed), septic shock 2/2 suspected CAP, profound lactic acidosis, AKI and elevated LFTs. Troponin and BNP significantly elevated. EKG in last 24 hours shows evolution of inferior STEMI. She required low-dose dopamine with improvement in heart rate and hypotension. Lactic acid peaked at 9. Blood gas showed respiratory compensation. Procalcitonin elevated at 4.9. Broad spectrum antibiotics initiated in ED. Imaging obtained includes CXR suggesting L>R bibasilar opacities and cardiomegaly, RUQ abd suggesting fatty liver and CT a/p showing small amount of free pelvic fluid without evidence for rupture or perforation. U/A shows moderate leuks, many bacteria and mucus. Echocardiogram completed showing EF of 25% with wall motion abnormalities.   4/15 Admit to SDU for monitoring on heparin drip with N-STEMI & Sepsis  4/16 remained on dopamine for cardiogenic and septic shock; urine cxs positive for E coli  4/17 remains on pressors, lasix as tolerated  4/18 remains on pressors    Subjective:    Joanna Stone today significant events overnight as discussed with staff, she has been transitioned to comfort care yesterday given worsening clinical condition, and very poor prognosis and life  quality.    Assessment  & Plan :    Active Problems:   Bradycardia, sinus, persistent,  severe   Sepsis (HCC)   NSTEMI (non-ST elevated myocardial infarction) (HCC)   Acute kidney injury superimposed on chronic kidney disease (HCC)   Pressure injury of skin  Acute inferior STEMI Acute systolic CHF ischemic cardiomyopathy Cardiogenic shock Septic shock E. 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 deficiency anemia Atrial fibrillation Transaminitis Hyponatremia Hyperglycemia  -Patient admitted with septic shock, inferior STEMI, she was treated with pressors, antibiotics, by cardiology, and anticoagulated for cardiac cath, she has been managed medically, and her work-up was as was significant for low EF of 25%, patient with multiorgan failure, with worsening renal functions, elevated LFTs, with arrhythmias as well, overall she is very frail, with severe deconditioning, and failure to thrive, palliative has been following closely, patient continued to deteriorate, at this point also discussion has been carried by [facility] team, and legal guardians, and decision has been made to proceed with full comfort measures, given overall very poor baseline and prognosis.  SpO2: (!) 74 % O2 Flow Rate (L/min): 2 L/min  Recent Labs  Lab  0000 12/24/20 1516 12/24/20 1540 12/24/20 1929 12/24/20 2024 12/24/20 2218 12/25/20 0124 12/25/20 0419 12/25/20 0425 12/25/20 1000 12/25/20 1400 12/25/20 1657 12/25/20 1757 12/26/20 0457 12/26/20 1229 12/26/20 1648 12/27/20 0501 12/27/20 0646 12/28/20 0424  WBC   < >  --  26.0*  --   --   --  23.5*  --    < >  --   --   --  17.0* 16.4*  --  15.1* 13.5*  --  12.0*  PLT   < >  --  227  --   --   --  175  --    < >  --   --   --  198 187  --  178 172  --  185  BNP  --   --  3,981.5*  --   --   --   --   --   --   --   --   --    --   --   --   --   --   --   --   DDIMER  --   --   --   --   --   --   --  4.53*  --   --   --   --   --   --   --   --   --   --   --   PROCALCITON  --   --   --  3.57  --   --  4.90  --   --   --   --   --   --   --   --   --   --  4.79 4.23  AST  --   --  411*  --   --   --  355*  --   --   --   --   --   --  183*  --   --  93*  --  76*  ALT  --   --  95*  --   --   --  136*  --   --   --   --   --   --  103*  --   --  73*  --  73*  ALKPHOS  --   --  92  --   --   --  93  --   --   --   --   --   --  78  --   --  70  --  81  BILITOT  --   --  0.8  --   --   --  0.6  --   --   --   --   --   --  0.8  --   --  0.9  --  0.8  ALBUMIN  --   --  3.6  --   --   --  2.5*  --   --   --   --   --   --  2.7*  --   --  2.5*  --  2.6*  INR  --   --   --  1.3*  --   --   --  1.4*  --   --   --   --   --   --   --   --   --   --   --   LATICACIDVEN  --  6.1*  --   --    < >  --  6.9*  --   --  2.3* 2.3* 2.3*  --   --  2.1*  --   --   --   --   SARSCOV2NAA  --  NEGATIVE  --   --   --  NEGATIVE  --   --   --   --   --   --   --   --   --   --   --   --   --    < > = values in this interval not displayed.       ABG     Component Value Date/Time   PHART 7.37 12/25/2020 0107   PCO2ART <19.0 (LL) 12/25/2020 0107   PO2ART 88 12/25/2020 0107   HCO3 24.7 12/25/2020 1020   ACIDBASEDEF 13.7 (H) 12/25/2020 0107   O2SAT 77.7 12/25/2020 1020       Condition - Extremely Guarded  Family Communication  :  None at bedside  Code Status :  DNR/Comfort  Consults  :  PCCM>TRH Palliative Cardiolgoy   Disposition Plan  :    Status is: Inpatient  Remains inpatient appropriate because:IV treatments appropriate due to intensity of illness or inability to take PO   Dispo: The patient is from: Home              Anticipated d/c is to: Hospice/comfort              Patient currently is not medically stable to d/c.   Difficult to place patient No      DVT Prophylaxis  :  Comfort  Lab Results   Component Value Date   PLT 185 12/28/2020    Diet :  Diet Order            Diet regular Room service appropriate? Yes; Fluid consistency: Thin  Diet effective now                  Inpatient Medications  Scheduled Meds: Continuous Infusions: PRN Meds:.acetaminophen **OR** acetaminophen, diazepam, HYDROmorphone (DILAUDID) injection  Antibiotics  :    Anti-infectives (From admission, onward)   Start     Dose/Rate Route Frequency Ordered Stop   12/26/20 1200  vancomycin (VANCOREADY) IVPB 500 mg/100 mL  500 mg 100 mL/hr over 60 Minutes Intravenous  Once 12/26/20 0923 12/26/20 1934   12/25/20 1900  ceFEPIme (MAXIPIME) 2 g in sodium chloride 0.9 % 100 mL IVPB  Status:  Discontinued        2 g 200 mL/hr over 30 Minutes Intravenous Every 24 hours 12/24/20 1914 12/24/20 1940   12/25/20 0900  ceFEPIme (MAXIPIME) 1 g in sodium chloride 0.9 % 100 mL IVPB  Status:  Discontinued        1 g 200 mL/hr over 30 Minutes Intravenous Every 24 hours 12/25/20 0737 12/28/20 1046   12/25/20 0600  Ampicillin-Sulbactam (UNASYN) 3 g in sodium chloride 0.9 % 100 mL IVPB  Status:  Discontinued        3 g 200 mL/hr over 30 Minutes Intravenous Every 12 hours 12/25/20 0230 12/25/20 0727   12/24/20 1916  vancomycin variable dose per unstable renal function (pharmacist dosing)  Status:  Discontinued         Does not apply See admin instructions 12/24/20 1916 12/27/20 1046   12/24/20 1915  vancomycin (VANCOREADY) IVPB 1250 mg/250 mL        1,250 mg 166.7 mL/hr over 90 Minutes Intravenous  Once 12/24/20 1909 12/24/20 2210   12/24/20 1900  vancomycin (VANCOCIN) IVPB 1000 mg/200 mL premix  Status:  Discontinued        1,000 mg 200 mL/hr over 60 Minutes Intravenous  Once 12/24/20 1856 12/24/20 1909   12/24/20 1900  ceFEPIme (MAXIPIME) 2 g in sodium chloride 0.9 % 100 mL IVPB  Status:  Discontinued        2 g 200 mL/hr over 30 Minutes Intravenous  Once 12/24/20 1856 12/24/20 1914   12/24/20 1900   azithromycin (ZITHROMAX) 500 mg in sodium chloride 0.9 % 250 mL IVPB  Status:  Discontinued        500 mg 250 mL/hr over 60 Minutes Intravenous Every 24 hours 12/24/20 1856 12/24/20 1940   12/24/20 1630  Ampicillin-Sulbactam (UNASYN) 3 g in sodium chloride 0.9 % 100 mL IVPB        3 g 200 mL/hr over 30 Minutes Intravenous  Once 12/24/20 1627 12/24/20 1806       Olesya Wike M.D on 12/29/2020 at 10:11 AM  To page go to [email or web address]   [facility] -  Office  [phone]      Objective:   Vitals:   12/28/20 1600 12/28/20 1700 12/28/20 1800 12/28/20 2059  BP: 101/62   107/63  Pulse: (!) 54 (!) 39  (!) 49  Resp: (!) 29 (!) 37 (!) 27 (!) 22  Temp: (!) 100.4 F (38 C) 100.2 F (37.9 C) 100.1 F (37.8 C) 98.3 F (36.8 C)  TempSrc:    Oral  SpO2: 98% (!) 89%  (!) 74%  Weight:      Height:        Wt Readings from Last 3 Encounters:  12/24/20 55.7 kg  11/25/19 59 kg  12/03/18 56.7 kg     Intake/Output Summary (Last 24 hours) at 12/29/2020 1011 Last data filed at 12/28/2020 1600 Gross per 24 hour  Intake 214.5 ml  Output 50 ml  Net 164.5 ml     Physical Exam  Patient extremely frail, ill-appearing ,awake, confused  Symmetrical Chest wall movement, Good air movement bilaterally IRR IRR,No Gallops,Rubs,+ Murmurs, No Parasternal Heave +ve B.Sounds, Abd Soft, No tenderness. No Cyanosis, Clubbing or edema, No new Rash or bruise     Data Review:    CBC  Recent Labs  Lab 12/24/20 1540 12/24/20 2155 12/25/20 1757 12/26/20 0228 12/26/20 0457 12/26/20 1648 12/26/20 2248 12/27/20 0501 12/28/20 0424  WBC 26.0*   < > 17.0*  --  16.4* 15.1*  --  13.5* 12.0*  HGB 11.4*   < > 9.7*   < > 10.1* 10.0* 10.0* 9.6* 10.5*  HCT 35.3*   < > 29.1*   < > 29.6* 29.9* 30.7* 28.5* 31.6*  PLT 227   < > 198  --  187 178  --  172 185  MCV 99.2   < > 95.4  --  94.3 95.5  --  96.3 96.9  MCH 32.0   < > 31.8  --  32.2 31.9  --  32.4 32.2  MCHC 32.3   < > 33.3  --  34.1 33.4   --  33.7 33.2  RDW 14.2   < > 13.9  --  14.0 13.9  --  14.0 13.8  LYMPHSABS 1.5  --   --   --   --   --   --   --   --   MONOABS 1.3*  --   --   --   --   --   --   --   --   EOSABS 0.0  --   --   --   --   --   --   --   --   BASOSABS 0.0  --   --   --   --   --   --   --   --    < > = values in this interval not displayed.    Recent Labs  Lab 12/24/20 1540 12/24/20 1929 12/24/20 2024 12/25/20 0124 12/25/20 0220 12/25/20 0419 12/25/20 0813 12/25/20 1000 12/25/20 1019 12/25/20 1400 12/25/20 1657 12/26/20 0457 12/26/20 1229 12/27/20 0501 12/27/20 0646 12/28/20 0424  NA 137  --   --  144  --   --  138  --  139  --   --  140  --  142  --  146*  K 4.9  --   --  5.3*  --   --  5.0  --  4.9  --   --  4.2  --  3.8  --  4.2  CL 105  --   --  103  --   --  100  --  100  --   --  101  --  103  --  108  CO2 17*  --   --  23  --   --  24  --  24  --   --  26  --  24  --  23  GLUCOSE 208*  --   --  134*  --   --  124*  --  152*  --   --  140*  --  141*  --  162*  BUN 74*  --   --  71*  --   --  78*  --  83*  --   --  89*  --  97*  --  110*  CREATININE 3.91*  --   --  3.83*  --   --  3.96*  --  4.08*  --   --  3.96*  --  3.69*  --  4.08*  CALCIUM 9.0  --   --  7.9*  --   --  8.2*  --  8.1*  --   --  7.9*  --  8.0*  --  8.2*  AST 411*  --   --  355*  --   --   --   --   --   --   --  183*  --  93*  --  76*  ALT 95*  --   --  136*  --   --   --   --   --   --   --  103*  --  73*  --  73*  ALKPHOS 92  --   --  93  --   --   --   --   --   --   --  78  --  70  --  81  BILITOT 0.8  --   --  0.6  --   --   --   --   --   --   --  0.8  --  0.9  --  0.8  ALBUMIN 3.6  --   --  2.5*  --   --   --   --   --   --   --  2.7*  --  2.5*  --  2.6*  MG 2.4  --   --  2.1  --   --   --   --   --   --   --  2.3  --  2.3  --  2.5*  DDIMER  --   --   --   --   --  4.53*  --   --   --   --   --   --   --   --   --   --   PROCALCITON  --  3.57  --  4.90  --   --   --   --   --   --   --   --   --   --  4.79 4.23   LATICACIDVEN  --   --    < > 6.9*  --   --   --  2.3*  --  2.3* 2.3*  --  2.1*  --   --   --   INR  --  1.3*  --   --   --  1.4*  --   --   --   --   --   --   --   --   --   --   TSH  --  3.286  --   --   --   --   --   --   --   --   --   --   --   --   --   --   HGBA1C  --   --   --   --  5.7*  --   --   --   --   --   --   --   --   --   --   --   AMMONIA  --   --   --   --  11  --   --   --   --   --   --   --   --   --   --   --   BNP 3,981.5*  --   --   --   --   --   --   --   --   --   --   --   --   --   --   --    < > =  values in this interval not displayed.    ------------------------------------------------------------------------------------------------------------------ No results for input(s): CHOL, HDL, LDLCALC, TRIG, CHOLHDL, LDLDIRECT in the last 72 hours.  Lab Results  Component Value Date   HGBA1C 5.7 (H) 12/25/2020   ------------------------------------------------------------------------------------------------------------------ No results for input(s): TSH, T4TOTAL, T3FREE, THYROIDAB in the last 72 hours.  Invalid input(s): FREET3  Cardiac Enzymes No results for input(s): CKMB, TROPONINI, MYOGLOBIN in the last 168 hours.  Invalid input(s): CK ------------------------------------------------------------------------------------------------------------------    Component Value Date/Time   BNP 3,981.5 (H) 12/24/2020 1540    Micro Results Recent Results (from the past 240 hour(s))  SARS CORONAVIRUS 2 (TAT 6-24 HRS) Nasopharyngeal Nasopharyngeal Swab     Status: None   Collection Time: 12/24/20  3:16 PM   Specimen: Nasopharyngeal Swab  Result Value Ref Range Status   SARS Coronavirus 2 NEGATIVE NEGATIVE Final    Comment: (NOTE) SARS-CoV-2 target nucleic acids are NOT DETECTED.  The SARS-CoV-2 RNA is generally detectable in upper and lower respiratory specimens during the acute phase of infection. Negative results do not preclude SARS-CoV-2 infection, do  not rule out co-infections with other pathogens, and should not be used as the sole basis for treatment or other patient management decisions. Negative results must be combined with clinical observations, patient history, and epidemiological information. The expected result is Negative.  Fact Sheet for Patients: HairSlick.no  Fact Sheet for Healthcare Providers: quierodirigir.com  This test is not yet approved or cleared by the [place] FDA and  has been authorized for detection and/or diagnosis of SARS-CoV-2 by FDA under an Emergency Use Authorization (EUA). This EUA will remain  in effect (meaning this test can be used) for the duration of the COVID-19 declaration under Se ction 564(b)(1) of the Act, 21 U.S.C. section 360bbb-3(b)(1), unless the authorization is terminated or revoked sooner.  Performed at [facility], [address], [place], [place] [address]   Culture, Urine     Status: Abnormal   Collection Time: 12/24/20  3:16 PM   Specimen: Urine, Random  Result Value Ref Range Status   Specimen Description   Final    URINE, RANDOM Performed at [facility], [address] [place], [place] [address]    Special Requests   Final    NONE Performed at [facility], [address] [place], [place] [address]    Culture >=100,000 COLONIES/mL ESCHERICHIA COLI (A)  Final   Report Status 12/27/2020 FINAL  Final   Organism ID, Bacteria ESCHERICHIA COLI (A)  Final      Susceptibility   Escherichia coli - MIC*    AMPICILLIN >=32 RESISTANT Resistant     CEFAZOLIN <=4 SENSITIVE Sensitive     CEFEPIME <=0.12 SENSITIVE Sensitive     CEFTRIAXONE <=0.25 SENSITIVE Sensitive     CIPROFLOXACIN <=0.25 SENSITIVE Sensitive     GENTAMICIN <=1 SENSITIVE Sensitive     IMIPENEM <=0.25 SENSITIVE Sensitive     NITROFURANTOIN <=16 SENSITIVE Sensitive     TRIMETH/SULFA >=320 RESISTANT Resistant      AMPICILLIN/SULBACTAM 16 INTERMEDIATE Intermediate     PIP/TAZO <=4 SENSITIVE Sensitive     * >=100,000 COLONIES/mL ESCHERICHIA COLI  Blood culture (routine x 2)     Status: None   Collection Time: 12/24/20  4:39 PM   Specimen: Right Antecubital; Blood  Result Value Ref Range Status   Specimen Description RIGHT ANTECUBITAL  Final   Special Requests   Final    BOTTLES DRAWN AEROBIC AND ANAEROBIC Blood Culture adequate volume  Culture   Final    NO GROWTH 5 DAYS Performed at [facility], [address] [place], [place] [address]    Report Status 12/29/2020 FINAL  Final  Blood culture (routine x 2)     Status: None   Collection Time: 12/24/20  4:39 PM   Specimen: BLOOD  Result Value Ref Range Status   Specimen Description BLOOD BLOOD LEFT HAND  Final   Special Requests   Final    BOTTLES DRAWN AEROBIC ONLY Blood Culture adequate volume   Culture   Final    NO GROWTH 5 DAYS Performed at [facility], [address] [place], [place] [address]    Report Status 12/29/2020 FINAL  Final  Resp Panel by RT-PCR (Flu A&B, Covid) Nasopharyngeal Swab     Status: None   Collection Time: 12/24/20 10:18 PM   Specimen: Nasopharyngeal Swab; Nasopharyngeal(NP) swabs in vial transport medium  Result Value Ref Range Status   SARS Coronavirus 2 by RT PCR NEGATIVE NEGATIVE Final    Comment: (NOTE) SARS-CoV-2 target nucleic acids are NOT DETECTED.  The SARS-CoV-2 RNA is generally detectable in upper respiratory specimens during the acute phase of infection. The lowest concentration of SARS-CoV-2 viral copies this assay can detect is 138 copies/mL. A negative result does not preclude SARS-Cov-2 infection and should not be used as the sole basis for treatment or other patient management decisions. A negative result may occur with  improper specimen collection/handling, submission of specimen other than nasopharyngeal swab, presence of viral mutation(s) within the areas targeted  by this assay, and inadequate number of viral copies(<138 copies/mL). A negative result must be combined with clinical observations, patient history, and epidemiological information. The expected result is Negative.  Fact Sheet for Patients:  BloggerCourse.comhttps://www.fda.gov/media/152166/download  Fact Sheet for Healthcare Providers:  SeriousBroker.ithttps://www.fda.gov/media/152162/download  This test is no t yet approved or cleared by the [place] FDA and  has been authorized for detection and/or diagnosis of SARS-CoV-2 by FDA under an Emergency Use Authorization (EUA). This EUA will remain  in effect (meaning this test can be used) for the duration of the COVID-19 declaration under Section 564(b)(1) of the Act, 21 U.S.C.section 360bbb-3(b)(1), unless the authorization is terminated  or revoked sooner.       Influenza A by PCR NEGATIVE NEGATIVE Final   Influenza B by PCR NEGATIVE NEGATIVE Final    Comment: (NOTE) The Xpert Xpress SARS-CoV-2/FLU/RSV plus assay is intended as an aid in the diagnosis of influenza from Nasopharyngeal swab specimens and should not be used as a sole basis for treatment. Nasal washings and aspirates are unacceptable for Xpert Xpress SARS-CoV-2/FLU/RSV testing.  Fact Sheet for Patients: BloggerCourse.comhttps://www.fda.gov/media/152166/download  Fact Sheet for Healthcare Providers: SeriousBroker.ithttps://www.fda.gov/media/152162/download  This test is not yet approved or cleared by the [place] FDA and has been authorized for detection and/or diagnosis of SARS-CoV-2 by FDA under an Emergency Use Authorization (EUA). This EUA will remain in effect (meaning this test can be used) for the duration of the COVID-19 declaration under Section 564(b)(1) of the Act, 21 U.S.C. section 360bbb-3(b)(1), unless the authorization is terminated or revoked.  Performed at [facility], [address] [place], [place] [address]   MRSA PCR Screening     Status: Abnormal   Collection Time: 12/24/20  11:30 PM   Specimen: Nasopharyngeal  Result Value Ref Range Status   MRSA by PCR POSITIVE (A) NEGATIVE Final    Comment:        The GeneXpert MRSA Assay (  FDA approved for NASAL specimens only), is one component of a comprehensive MRSA colonization surveillance program. It is not intended to diagnose MRSA infection nor to guide or monitor treatment for MRSA infections. RESULT CALLED TO, READ BACK BY AND VERIFIED WITH: KRISTINE SAUL AT 0136 12/25/20 MF. Performed at [facility], [address] [place], [place] [address]   Culture, blood (x 2)     Status: None (Preliminary result)   Collection Time: 12/25/20  1:07 AM   Specimen: BLOOD  Result Value Ref Range Status   Specimen Description BLOOD LINE  Final   Special Requests   Final    BOTTLES DRAWN AEROBIC AND ANAEROBIC Blood Culture results may not be optimal due to an excessive volume of blood received in culture bottles   Culture   Final    NO GROWTH 4 DAYS Performed at [facility], [address], [place], [place] [address]    Report Status PENDING  Incomplete  Culture, blood (x 2)     Status: None (Preliminary result)   Collection Time: 12/25/20  1:24 AM   Specimen: BLOOD  Result Value Ref Range Status   Specimen Description BLOOD LEFT FOREARM  Final   Special Requests   Final    AEROBIC BOTTLE ONLY Blood Culture results may not be optimal due to an inadequate volume of blood received in culture bottles   Culture   Final    NO GROWTH 4 DAYS Performed at [facility], [address], [place], [place] [address]    Report Status PENDING  Incomplete    Radiology Reports CT ABDOMEN PELVIS WO CONTRAST  Result Date: 12/24/2020 CLINICAL DATA:  Recent choking episode EXAM: CT ABDOMEN AND PELVIS WITHOUT CONTRAST TECHNIQUE: Multidetector CT imaging of the abdomen and pelvis was performed following the standard protocol without IV contrast. COMPARISON:  None. FINDINGS: Lower chest: Small bilateral  pleural effusions are noted. Patchy airspace opacity is noted in the lower lobes left greater than right consistent with early infiltrate. Hepatobiliary: No focal liver abnormality is seen. No gallstones, gallbladder wall thickening, or biliary dilatation. Pancreas: Unremarkable. No pancreatic ductal dilatation or surrounding inflammatory changes. Spleen: Normal in size without focal abnormality. Adrenals/Urinary Tract: Adrenal glands are within normal limits. Kidneys demonstrate no renal calculi or obstructive changes. The bladder is partially distended despite Foley catheter placement Stomach/Bowel: No obstructive or inflammatory changes of the colon are seen. The appendix is not well visualized consistent with a prior surgical history. Small bowel is unremarkable. Moderate-sized hiatal hernia is noted. Vascular/Lymphatic: Aortic atherosclerosis. No enlarged abdominal or pelvic lymph nodes. Reproductive: Status post hysterectomy. No adnexal masses. Other: No abdominal wall hernia or abnormality. Mild free pelvic fluid is noted. Musculoskeletal: No acute or significant osseous findings. Degenerative changes and scoliosis concave to the left in the lumbar spine are seen IMPRESSION: Bilateral pleural effusions with patchy basilar airspace opacity consistent with early infiltrate. Mild free pelvic fluid of uncertain significance. Electronically Signed   By: Alcide Clever M.D.   On: 12/24/2020 22:41   CT Head Wo Contrast  Result Date: 12/24/2020 CLINICAL DATA:  Mental status change, slurred speech, lethargy, dementia EXAM: CT HEAD WITHOUT CONTRAST TECHNIQUE: Contiguous axial images were obtained from the base of the skull through the vertex without intravenous contrast. COMPARISON:  11/25/2019 FINDINGS: Brain: Stable atrophy and chronic white matter microvascular ischemic changes throughout both cerebral hemispheres. Remote bilateral basal ganglia lacunar type infarcts with ex vacuo dilatation of the right lateral  ventricle, unchanged. Stable mild  ventricular enlargement. No acute intracranial hemorrhage, new mass lesion, definite acute infarction, midline shift, or extra-axial fluid collection. No focal mass effect or edema. Cisterns are patent. Cerebellar atrophy as well. 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 No hyperdense vessel. Skull: Remote left craniotomy.  Mastoids are clear. Sinuses/Orbits: No acute finding. Other: None. IMPRESSION: Stable atrophy, chronic white matter microvascular ischemic changes, and remote basal ganglia lacunar type infarcts. No interval change or acute intracranial abnormality by noncontrast CT. Electronically Signed   By: Judie Petit.  Shick M.D.   On: 12/24/2020 16:19   US RENAL  Result Date: 12/25/2020 CLINICAL DATA:  Acute kidney injury. EXAM: RENAL / URINARY TRACT ULTRASOUND COMPLETE COMPARISON:  None. FINDINGS: Right Kidney: Renal measurements: 9.9 x 4.6 x 4.3 cm = volume: 103 mL. RIGHT renal cortex is echogenic. No mass or hydronephrosis visualized. Left Kidney: Not seen due to overlying bowel gas. Bladder: Decompressed by Foley catheter. Other: None. IMPRESSION: 1. RIGHT kidney is echogenic indicating chronic medical renal disease. No RIGHT-sided hydronephrosis. 2. LEFT kidney not visualized. 3. Bladder decompressed by Foley catheter. Electronically Signed   By: Bary Richard M.D.   On: 12/25/2020 09:01   DG Chest Port 1 View  Result Date: 12/25/2020 CLINICAL DATA:  Check central line placement EXAM: PORTABLE CHEST 1 VIEW COMPARISON:  12/24/2020 FINDINGS: Cardiac shadow is within normal limits. New right jugular central line is noted at the cavoatrial junction. Aortic calcifications are again seen and stable. Lungs are clear bilaterally. Mild vascular congestion remains. No pneumothorax is noted. IMPRESSION: Mild vascular congestion. Right jugular central line in satisfactory position without pneumothorax. Electronically Signed   By: Alcide Clever M.D.   On: 12/25/2020  02:21   DG Chest Portable 1 View  Result Date: 12/24/2020 CLINICAL DATA:  Tachypnea, choking episode yesterday EXAM: PORTABLE CHEST 1 VIEW COMPARISON:  11/25/2019 FINDINGS: Single frontal view of the chest demonstrates stable enlarged cardiac silhouette. There is increased vascular congestion, with minimal right basilar consolidation. No large effusion or pneumothorax. Lung volumes are diminished. IMPRESSION: 1. Increased central vascular congestion, with developing right basilar consolidation. This could reflect mild pulmonary edema, though aspiration could be considered given history of choking episode yesterday. Electronically Signed   By: Sharlet Salina M.D.   On: 12/24/2020 15:59   ECHOCARDIOGRAM COMPLETE  Result Date: 12/25/2020    ECHOCARDIOGRAM REPORT   Patient Name:   Joanna Stone Date of Exam: 12/25/2020 Medical Rec #:  [id number]        Height:       66.0 in Accession #:    [id number]       Weight:       122.8 lb Date of Birth:  Dec 26, 1940        BSA:          1.625 m Patient Age:    79 years         BP:           134/77 mmHg Patient Gender: F                HR:           62 bpm. Exam Location:  [facility] Procedure: 2D Echo and 3D Echo Indications:     NSTEMI I21.4  History:         Patient has prior history of Echocardiogram examinations, most                  recent 06/04/2017.  Sonographer:  Overton Mam RDCS Referring Phys:  [id number] BRITTON L R[place] Diagnosing Phys: Arnoldo Hooker MD IMPRESSIONS  1. Left ventricular ejection fraction, by estimation, is 25 to 30%. The left ventricle has severely decreased function. The left ventricle demonstrates regional wall motion abnormalities (see scoring diagram/findings for description). The left ventricular internal cavity size was mildly dilated. Left ventricular diastolic function could not be evaluated.  2. Right ventricular systolic function is normal. The right ventricular size is normal.  3. Left atrial size was severely dilated.  4.  Right atrial size was moderately dilated.  5. The mitral valve is normal in structure. Severe mitral valve regurgitation.  6. Tricuspid valve regurgitation is moderate to severe.  7. The aortic valve is normal in structure. Aortic valve regurgitation is mild. FINDINGS  Left Ventricle: Left ventricular ejection fraction, by estimation, is 25 to 30%. The left ventricle has severely decreased function. The left ventricle demonstrates regional wall motion abnormalities. Severe akinesis of the left ventricular, entire inferoseptal wall, inferior wall, lateral wall, inferolateral wall and inferior segment. The left ventricular internal cavity size was mildly dilated. There is no left ventricular hypertrophy. Left ventricular diastolic function could not be evaluated. Right Ventricle: The right ventricular size is normal. No increase in right ventricular wall thickness. Right ventricular systolic function is normal. Left Atrium: Left atrial size was severely dilated. Right Atrium: Right atrial size was moderately dilated. Pericardium: There is no evidence of pericardial effusion. Mitral Valve: The mitral valve is normal in structure. Severe mitral valve regurgitation. Tricuspid Valve: The tricuspid valve is normal in structure. Tricuspid valve regurgitation is moderate to severe. Aortic Valve: The aortic valve is normal in structure. Aortic valve regurgitation is mild. Aortic regurgitation PHT measures 537 msec. Aortic valve peak gradient measures 5.1 mmHg. Pulmonic Valve: The pulmonic valve was normal in structure. Pulmonic valve regurgitation is mild. Aorta: The aortic root and ascending aorta are structurally normal, with no evidence of dilitation. IAS/Shunts: No atrial level shunt detected by color flow Doppler.  LEFT VENTRICLE PLAX 2D LVIDd:         4.97 cm      Diastology LVIDs:         4.40 cm      LV e' medial:    5.33 cm/s LV PW:         1.11 cm      LV E/e' medial:  17.6 LV IVS:        1.21 cm      LV e' lateral:    5.11 cm/s                             LV E/e' lateral: 18.3  LV Volumes (MOD) LV vol d, MOD A2C: 83.5 ml LV vol d, MOD A4C: 101.0 ml LV vol s, MOD A2C: 83.7 ml LV vol s, MOD A4C: 74.1 ml LV SV MOD A2C:     -0.2 ml LV SV MOD A4C:     101.0 ml LV SV MOD BP:      14.1 ml RIGHT VENTRICLE RV Basal diam:  3.76 cm RV S prime:     6.96 cm/s TAPSE (M-mode): 1.7 cm LEFT ATRIUM              Index       RIGHT ATRIUM           Index LA diam:        5.90 cm  3.63 cm/m  RA Area:     24.10 cm LA Vol (A2C):   104.0 ml 63.98 ml/m RA Volume:   85.70 ml  52.73 ml/m LA Vol (A4C):   128.0 ml 78.75 ml/m LA Biplane Vol: 124.0 ml 76.29 ml/m  AORTIC VALVE              PULMONIC VALVE AV Vmax:      113.00 cm/s PV Vmax:       0.77 m/s AV Peak Grad: 5.1 mmHg    PV Peak grad:  2.4 mmHg LVOT Vmax:    53.20 cm/s LVOT Vmean:   32.000 cm/s LVOT VTI:     0.084 m AI PHT:       537 msec  AORTA Ao Asc diam: 3.00 cm MITRAL VALVE                TRICUSPID VALVE MV Area (PHT): 5.58 cm     TV Peak grad:   34.2 mmHg MV Decel Time: 136 msec     TV Vmax:        2.92 m/s MR Peak grad:   113.2 mmHg MR Mean grad:   70.0 mmHg   SHUNTS MR Vmax:        532.00 cm/s Systemic VTI: 0.08 m MR Vmean:       388.0 cm/s MR PISA:        2.65 cm MR PISA Radius: 0.65 cm MV E velocity: 93.60 cm/s Arnoldo Hooker MD Electronically signed by Arnoldo Hooker MD Signature Date/Time: 12/25/2020/2:59:31 PM    Final    US Abdomen Limited RUQ (LIVER/GB)  Result Date: 12/24/2020 CLINICAL DATA:  Transaminitis EXAM: ULTRASOUND ABDOMEN LIMITED RIGHT UPPER QUADRANT COMPARISON:  None. FINDINGS: Gallbladder: Markedly thickened gallbladder wall measuring up to 7 mm in thickness. No stones or sonographic Murphy sign. Common bile duct: Diameter: Normal caliber, 3 mm. Liver: Increased echotexture compatible with fatty infiltration. No focal abnormality or biliary ductal dilatation. Portal vein is patent on color Doppler imaging with normal direction of blood flow towards the liver. Other:  None. IMPRESSION: Hepatic steatosis. Markedly thickened gallbladder wall without visible stones. This could be related to liver disease. Electronically Signed   By: Charlett Nose M.D.   On: 12/24/2020 20:18

## 2020-12-30 LAB — CULTURE, BLOOD (ROUTINE X 2): Culture: NO GROWTH

## 2020-12-30 MED ORDER — LORAZEPAM 2 MG/ML PO CONC: 1.0000 mg | ORAL | Status: DC | PRN

## 2020-12-30 MED ORDER — ACETAMINOPHEN 325 MG PO TABS: 650.0000 mg | ORAL_TABLET | Freq: Four times a day (QID) | ORAL | Status: AC | PRN

## 2020-12-30 MED ORDER — LORAZEPAM 2 MG/ML PO CONC: 1.0000 mg | ORAL | 0 refills | Status: AC | PRN

## 2020-12-30 MED ORDER — HYDROMORPHONE HCL 2 MG PO TABS: 1.0000 mg | ORAL_TABLET | ORAL | 0 refills | Status: AC | PRN

## 2020-12-30 MED ORDER — HYDROMORPHONE HCL 2 MG PO TABS: 1.0000 mg | ORAL_TABLET | ORAL | Status: DC | PRN

## 2020-12-30 NOTE — Care Management Obs Status (Signed)
MEDICARE OBSERVATION STATUS NOTIFICATION   Patient Details  Name: Joanna Stone MRN: [id number] Date of Birth: 1941-01-15   Medicare Observation Status Notification Given:   Yes.    Caryn Section, RN 12/30/2020, 10:39 AM

## 2020-12-30 NOTE — Plan of Care (Signed)
PMT note:  In to see patient to check on symptom management needs. She is resting in bed and alert. No complaint. Even and unlabored respirations. No distress noted.   No charge

## 2020-12-30 NOTE — Discharge Summary (Signed)
Physician Discharge Summary  AVIGAYIL TON NFA:[id number] DOB: [birth year] DOA: 12/24/2020  PCP: Patient, No Pcp Per (Inactive)  Admit date: 12/24/2020 Discharge date: 12/30/2020  Admitted From: ALF Disposition:  Residential Hospice  Recommendations for Outpatient Follow-up:  - per Hospice  Home Health: NO Equipment/Devices:No  Discharge Condition: hospice CODE STATUS:( DNR, Comfort Care) Diet recommendation: Regular /feeding for comfort  Brief/Interim Summary:  [name] is an 80 y.o.[facility]emalefrom [facility]F here for inferior STEMI (medically managed), septic shock 2/2 suspected CAP, profound lactic acidosis, AKI and elevated LFTs. Troponin and BNP significantly elevated. EKG in last 24 hours shows evolution of inferior STEMI. She required low-dose dopamine with improvement in heart rate and hypotension. Lactic acid peaked at 9. Blood gas showed respiratory compensation. Procalcitonin elevated at 4.9. Broad spectrum antibiotics initiated in ED. Imaging obtained includes CXR suggesting L>R bibasilar opacities and cardiomegaly, RUQ abd suggesting fatty liver and CT a/p showing small amount of free pelvic fluid without evidence for rupture or perforation. U/A shows moderate leuks, many bacteria and mucus. Echocardiogram completed showing EF of 25% with wall motion abnormalities. She was in ICU under PCCM care, cardiology care, for acute inferior STEMI, and ischemic cardiomyopathy, with cardiogenic shock, as well she has septic shock due to E. coli and aspiration pneumonia where she required pressor support, patient was transferred to triage service on 12/29/2020 after she was transition to full comfort measure while in ICU as her overall prognosis very poor with multiple underlying comorbidities and poor baseline.   Acute inferior STEMI Acute systolic CHF ischemic cardiomyopathy Cardiogenic shock Septic shock E. 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 deficiency anemia Atrial fibrillation Transaminitis Hyponatremia Hyperglycemia  -Patient admitted with septic shock, inferior STEMI, she was treated with pressors, antibiotics, by cardiology, and anticoagulated for cardiac cath, she has been managed medically, and her work-up was as was significant for low EF of 25%, patient with multiorgan failure, with worsening renal functions, elevated LFTs, with arrhythmias as well, overall she is very frail, with severe deconditioning, and failure to thrive, palliative has been following closely, patient continued to deteriorate, at this point also discussion has been carried by [facility] team, and legal guardians, and decision has been made to proceed with full comfort measures, given overall very poor baseline and prognosis.  It is discharged to hospice facility on 12/30/2020.   Discharge Diagnoses:  Active Problems:   Bradycardia, sinus, persistent, severe   Sepsis (HCC)   NSTEMI (non-ST elevated myocardial infarction) (HCC)   Acute kidney injury superimposed on chronic kidney disease (HCC)   Pressure injury of skin    Discharge Instructions  Discharge Instructions    Discharge instructions   Complete by: As directed    Management per hospice   Increase activity slowly   Complete by: As directed    No wound care   Complete by: As directed      Allergies as of 12/30/2020      Reactions   Ultram [tramadol]    vomiting      Medication List    STOP taking these medications   ammonium lactate 12 % cream Commonly known as: AMLACTIN   aspirin 81 MG tablet   atorvastatin 40 MG tablet Commonly known as: LIPITOR   Calcium Carbonate-Vitamin D 600-400 MG-UNIT tablet   cephALEXin 500 MG capsule Commonly known as: KEFLEX   citalopram 40 MG tablet Commonly known as: CELEXA   cloNIDine  0.1 mg/24hr patch Commonly known as:  CATAPRES - Dosed in mg/24 hr   clopidogrel 75 MG tablet Commonly known as: PLAVIX   enalapril 10 MG tablet Commonly known as: VASOTEC   ferrous sulfate 325 (65 FE) MG tablet   fexofenadine 180 MG tablet Commonly known as: ALLEGRA   gabapentin 300 MG capsule Commonly known as: NEURONTIN   hydrALAZINE 50 MG tablet Commonly known as: APRESOLINE   nystatin cream Commonly known as: MYCOSTATIN   pantoprazole 20 MG tablet Commonly known as: PROTONIX   polyethylene glycol 17 g packet Commonly known as: MIRALAX / GLYCOLAX   Vitamin D3 10 MCG (400 UNIT) tablet     TAKE these medications   acetaminophen 325 MG tablet Commonly known as: TYLENOL Take 2 tablets (650 mg total) by mouth every 6 (six) hours as needed for mild pain or fever. What changed:   medication strength  how much to take  when to take this  reasons to take this  additional instructions   HYDROmorphone 2 MG tablet Commonly known as: DILAUDID Take 0.5 tablets (1 mg total) by mouth every 3 (three) hours as needed for severe pain.   LORazepam 2 MG/ML concentrated solution Commonly known as: ATIVAN Take 0.5 mLs (1 mg total) by mouth every 4 (four) hours as needed (dyspnea).       Allergies  Allergen Reactions  . Ultram [Tramadol]     vomiting    Consultations:    PCCM>TRH Palliative Cardiolgoy   Procedures/Studies: CT ABDOMEN PELVIS WO CONTRAST  Result Date: 12/24/2020 CLINICAL DATA:  Recent choking episode EXAM: CT ABDOMEN AND PELVIS WITHOUT CONTRAST TECHNIQUE: Multidetector CT imaging of the abdomen and pelvis was performed following the standard protocol without IV contrast. COMPARISON:  None. FINDINGS: Lower chest: Small bilateral pleural effusions are noted. Patchy airspace opacity is noted in the lower lobes left greater than right consistent with early infiltrate. Hepatobiliary: No focal liver abnormality is seen. No gallstones, gallbladder wall thickening, or biliary dilatation.  Pancreas: Unremarkable. No pancreatic ductal dilatation or surrounding inflammatory changes. Spleen: Normal in size without focal abnormality. Adrenals/Urinary Tract: Adrenal glands are within normal limits. Kidneys demonstrate no renal calculi or obstructive changes. The bladder is partially distended despite Foley catheter placement Stomach/Bowel: No obstructive or inflammatory changes of the colon are seen. The appendix is not well visualized consistent with a prior surgical history. Small bowel is unremarkable. Moderate-sized hiatal hernia is noted. Vascular/Lymphatic: Aortic atherosclerosis. No enlarged abdominal or pelvic lymph nodes. Reproductive: Status post hysterectomy. No adnexal masses. Other: No abdominal wall hernia or abnormality. Mild free pelvic fluid is noted. Musculoskeletal: No acute or significant osseous findings. Degenerative changes and scoliosis concave to the left in the lumbar spine are seen IMPRESSION: Bilateral pleural effusions with patchy basilar airspace opacity consistent with early infiltrate. Mild free pelvic fluid of uncertain significance. Electronically Signed   By: Alcide Clever M.D.   On: 12/24/2020 22:41   CT Head Wo Contrast  Result Date: 12/24/2020 CLINICAL DATA:  Mental status change, slurred speech, lethargy, dementia EXAM: CT HEAD WITHOUT CONTRAST TECHNIQUE: Contiguous axial images were obtained from the base of the skull through the vertex without intravenous contrast. COMPARISON:  11/25/2019 FINDINGS: Brain: Stable atrophy and chronic white matter microvascular ischemic changes throughout both cerebral hemispheres. Remote bilateral basal ganglia lacunar type infarcts with ex vacuo dilatation of the right lateral ventricle, unchanged. Stable mild ventricular enlargement. No acute intracranial hemorrhage, new mass lesion, definite acute infarction, midline shift, or extra-axial fluid collection. No  focal mass effect or edema. Cisterns are patent. Cerebellar atrophy as  well. 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 No hyperdense vessel. Skull: Remote left craniotomy.  Mastoids are clear. Sinuses/Orbits: No acute finding. Other: None. IMPRESSION: Stable atrophy, chronic white matter microvascular ischemic changes, and remote basal ganglia lacunar type infarcts. No interval change or acute intracranial abnormality by noncontrast CT. Electronically Signed   By: Judie Petit.  Shick M.D.   On: 12/24/2020 16:19   US RENAL  Result Date: 12/25/2020 CLINICAL DATA:  Acute kidney injury. EXAM: RENAL / URINARY TRACT ULTRASOUND COMPLETE COMPARISON:  None. FINDINGS: Right Kidney: Renal measurements: 9.9 x 4.6 x 4.3 cm = volume: 103 mL. RIGHT renal cortex is echogenic. No mass or hydronephrosis visualized. Left Kidney: Not seen due to overlying bowel gas. Bladder: Decompressed by Foley catheter. Other: None. IMPRESSION: 1. RIGHT kidney is echogenic indicating chronic medical renal disease. No RIGHT-sided hydronephrosis. 2. LEFT kidney not visualized. 3. Bladder decompressed by Foley catheter. Electronically Signed   By: Bary Richard M.D.   On: 12/25/2020 09:01   DG Chest Port 1 View  Result Date: 12/25/2020 CLINICAL DATA:  Check central line placement EXAM: PORTABLE CHEST 1 VIEW COMPARISON:  12/24/2020 FINDINGS: Cardiac shadow is within normal limits. New right jugular central line is noted at the cavoatrial junction. Aortic calcifications are again seen and stable. Lungs are clear bilaterally. Mild vascular congestion remains. No pneumothorax is noted. IMPRESSION: Mild vascular congestion. Right jugular central line in satisfactory position without pneumothorax. Electronically Signed   By: Alcide Clever M.D.   On: 12/25/2020 02:21   DG Chest Portable 1 View  Result Date: 12/24/2020 CLINICAL DATA:  Tachypnea, choking episode yesterday EXAM: PORTABLE CHEST 1 VIEW COMPARISON:  11/25/2019 FINDINGS: Single frontal view of the chest demonstrates stable enlarged cardiac silhouette.  There is increased vascular congestion, with minimal right basilar consolidation. No large effusion or pneumothorax. Lung volumes are diminished. IMPRESSION: 1. Increased central vascular congestion, with developing right basilar consolidation. This could reflect mild pulmonary edema, though aspiration could be considered given history of choking episode yesterday. Electronically Signed   By: Sharlet Salina M.D.   On: 12/24/2020 15:59   ECHOCARDIOGRAM COMPLETE  Result Date: 12/25/2020    ECHOCARDIOGRAM REPORT   Patient Name:   Joanna Stone Date of Exam: 12/25/2020 Medical Rec #:  [id number]        Height:       66.0 in Accession #:    [id number]       Weight:       122.8 lb Date of Birth:  06/17/1941        BSA:          1.625 m Patient Age:    79 years         BP:           134/77 mmHg Patient Gender: F                HR:           62 bpm. Exam Location:  [facility] Procedure: 2D Echo and 3D Echo Indications:     NSTEMI I21.4  History:         Patient has prior history of Echocardiogram examinations, most                  recent 06/04/2017.  Sonographer:     Overton Mam RDCS Referring Phys:  [id number] BRITTON L R[place] Diagnosing Phys: Arnoldo Hooker MD IMPRESSIONS  1.  Left ventricular ejection fraction, by estimation, is 25 to 30%. The left ventricle has severely decreased function. The left ventricle demonstrates regional wall motion abnormalities (see scoring diagram/findings for description). The left ventricular internal cavity size was mildly dilated. Left ventricular diastolic function could not be evaluated.  2. Right ventricular systolic function is normal. The right ventricular size is normal.  3. Left atrial size was severely dilated.  4. Right atrial size was moderately dilated.  5. The mitral valve is normal in structure. Severe mitral valve regurgitation.  6. Tricuspid valve regurgitation is moderate to severe.  7. The aortic valve is normal in structure. Aortic valve regurgitation is mild.  FINDINGS  Left Ventricle: Left ventricular ejection fraction, by estimation, is 25 to 30%. The left ventricle has severely decreased function. The left ventricle demonstrates regional wall motion abnormalities. Severe akinesis of the left ventricular, entire inferoseptal wall, inferior wall, lateral wall, inferolateral wall and inferior segment. The left ventricular internal cavity size was mildly dilated. There is no left ventricular hypertrophy. Left ventricular diastolic function could not be evaluated. Right Ventricle: The right ventricular size is normal. No increase in right ventricular wall thickness. Right ventricular systolic function is normal. Left Atrium: Left atrial size was severely dilated. Right Atrium: Right atrial size was moderately dilated. Pericardium: There is no evidence of pericardial effusion. Mitral Valve: The mitral valve is normal in structure. Severe mitral valve regurgitation. Tricuspid Valve: The tricuspid valve is normal in structure. Tricuspid valve regurgitation is moderate to severe. Aortic Valve: The aortic valve is normal in structure. Aortic valve regurgitation is mild. Aortic regurgitation PHT measures 537 msec. Aortic valve peak gradient measures 5.1 mmHg. Pulmonic Valve: The pulmonic valve was normal in structure. Pulmonic valve regurgitation is mild. Aorta: The aortic root and ascending aorta are structurally normal, with no evidence of dilitation. IAS/Shunts: No atrial level shunt detected by color flow Doppler.  LEFT VENTRICLE PLAX 2D LVIDd:         4.97 cm      Diastology LVIDs:         4.40 cm      LV e' medial:    5.33 cm/s LV PW:         1.11 cm      LV E/e' medial:  17.6 LV IVS:        1.21 cm      LV e' lateral:   5.11 cm/s                             LV E/e' lateral: 18.3  LV Volumes (MOD) LV vol d, MOD A2C: 83.5 ml LV vol d, MOD A4C: 101.0 ml LV vol s, MOD A2C: 83.7 ml LV vol s, MOD A4C: 74.1 ml LV SV MOD A2C:     -0.2 ml LV SV MOD A4C:     101.0 ml LV SV MOD BP:       14.1 ml RIGHT VENTRICLE RV Basal diam:  3.76 cm RV S prime:     6.96 cm/s TAPSE (M-mode): 1.7 cm LEFT ATRIUM              Index       RIGHT ATRIUM           Index LA diam:        5.90 cm  3.63 cm/m  RA Area:     24.10 cm LA Vol (A2C):   104.0 ml 63.98 ml/m RA  Volume:   85.70 ml  52.73 ml/m LA Vol (A4C):   128.0 ml 78.75 ml/m LA Biplane Vol: 124.0 ml 76.29 ml/m  AORTIC VALVE              PULMONIC VALVE AV Vmax:      113.00 cm/s PV Vmax:       0.77 m/s AV Peak Grad: 5.1 mmHg    PV Peak grad:  2.4 mmHg LVOT Vmax:    53.20 cm/s LVOT Vmean:   32.000 cm/s LVOT VTI:     0.084 m AI PHT:       537 msec  AORTA Ao Asc diam: 3.00 cm MITRAL VALVE                TRICUSPID VALVE MV Area (PHT): 5.58 cm     TV Peak grad:   34.2 mmHg MV Decel Time: 136 msec     TV Vmax:        2.92 m/s MR Peak grad:   113.2 mmHg MR Mean grad:   70.0 mmHg   SHUNTS MR Vmax:        532.00 cm/s Systemic VTI: 0.08 m MR Vmean:       388.0 cm/s MR PISA:        2.65 cm MR PISA Radius: 0.65 cm MV E velocity: 93.60 cm/s Arnoldo Hooker MD Electronically signed by Arnoldo Hooker MD Signature Date/Time: 12/25/2020/2:59:31 PM    Final    US Abdomen Limited RUQ (LIVER/GB)  Result Date: 12/24/2020 CLINICAL DATA:  Transaminitis EXAM: ULTRASOUND ABDOMEN LIMITED RIGHT UPPER QUADRANT COMPARISON:  None. FINDINGS: Gallbladder: Markedly thickened gallbladder wall measuring up to 7 mm in thickness. No stones or sonographic Murphy sign. Common bile duct: Diameter: Normal caliber, 3 mm. Liver: Increased echotexture compatible with fatty infiltration. No focal abnormality or biliary ductal dilatation. Portal vein is patent on color Doppler imaging with normal direction of blood flow towards the liver. Other: None. IMPRESSION: Hepatic steatosis. Markedly thickened gallbladder wall without visible stones. This could be related to liver disease. Electronically Signed   By: Charlett Nose M.D.   On: 12/24/2020 20:18    (Echo, Carotid, EGD, Colonoscopy, ERCP)     Subjective:   Discharge Exam: Vitals:   12/30/20 0351 12/30/20 0420  BP: 115/85   Pulse: 67   Resp: 18   Temp: 98.3 F (36.8 C)   SpO2: (!) 88% 94%   Vitals:   12/28/20 2059 12/29/20 1451 12/30/20 0351 12/30/20 0420  BP: 107/63 130/86 115/85   Pulse: (!) 49 65 67   Resp: (!) Temp: 98.3 F (36.8 C) 97.7 F (36.5 C) 98.3 F (36.8 C)   TempSrc: Oral  Oral   SpO2: (!) 74% 99% (!) 88% 94%  Weight:      Height:        General: Extremely frail, ill-appearing, deconditioned, confused  Cardiovascular: RRR, S1/S2 +, no rubs, no gallops Respiratory: Diminished air entry bilaterally Abdominal: Soft, NT, ND, bowel sounds + Extremities: no edema, no cyanosis    The results of significant diagnostics from this hospitalization (including imaging, microbiology, ancillary and laboratory) are listed below for reference.     Microbiology: Recent Results (from the past 240 hour(s))  SARS CORONAVIRUS 2 (TAT 6-24 HRS) Nasopharyngeal Nasopharyngeal Swab     Status: None   Collection Time: 12/24/20  3:16 PM   Specimen: Nasopharyngeal Swab  Result Value Ref Range Status   SARS Coronavirus 2 NEGATIVE NEGATIVE Final  Comment: (NOTE) SARS-CoV-2 target nucleic acids are NOT DETECTED.  The SARS-CoV-2 RNA is generally detectable in upper and lower respiratory specimens during the acute phase of infection. Negative results do not preclude SARS-CoV-2 infection, do not rule out co-infections with other pathogens, and should not be used as the sole basis for treatment or other patient management decisions. Negative results must be combined with clinical observations, patient history, and epidemiological information. The expected result is Negative.  Fact Sheet for Patients: HairSlick.no  Fact Sheet for Healthcare Providers: quierodirigir.com  This test is not yet approved or cleared by the [place] FDA and  has  been authorized for detection and/or diagnosis of SARS-CoV-2 by FDA under an Emergency Use Authorization (EUA). This EUA will remain  in effect (meaning this test can be used) for the duration of the COVID-19 declaration under Se ction 564(b)(1) of the Act, 21 U.S.C. section 360bbb-3(b)(1), unless the authorization is terminated or revoked sooner.  Performed at [facility], [address], [place], [place] [address]   Culture, Urine     Status: Abnormal   Collection Time: 12/24/20  3:16 PM   Specimen: Urine, Random  Result Value Ref Range Status   Specimen Description   Final    URINE, RANDOM Performed at [facility], [address] [place], [place] [address]    Special Requests   Final    NONE Performed at [facility], [address] [place], [place] [address]    Culture >=100,000 COLONIES/mL ESCHERICHIA COLI (A)  Final   Report Status 12/27/2020 FINAL  Final   Organism ID, Bacteria ESCHERICHIA COLI (A)  Final      Susceptibility   Escherichia coli - MIC*    AMPICILLIN >=32 RESISTANT Resistant     CEFAZOLIN <=4 SENSITIVE Sensitive     CEFEPIME <=0.12 SENSITIVE Sensitive     CEFTRIAXONE <=0.25 SENSITIVE Sensitive     CIPROFLOXACIN <=0.25 SENSITIVE Sensitive     GENTAMICIN <=1 SENSITIVE Sensitive     IMIPENEM <=0.25 SENSITIVE Sensitive     NITROFURANTOIN <=16 SENSITIVE Sensitive     TRIMETH/SULFA >=320 RESISTANT Resistant     AMPICILLIN/SULBACTAM 16 INTERMEDIATE Intermediate     PIP/TAZO <=4 SENSITIVE Sensitive     * >=100,000 COLONIES/mL ESCHERICHIA COLI  Blood culture (routine x 2)     Status: None   Collection Time: 12/24/20  4:39 PM   Specimen: Right Antecubital; Blood  Result Value Ref Range Status   Specimen Description RIGHT ANTECUBITAL  Final   Special Requests   Final    BOTTLES DRAWN AEROBIC AND ANAEROBIC Blood Culture adequate volume   Culture   Final    NO GROWTH 5 DAYS Performed at [facility], [address],  [place], [place] [address]    Report Status 12/29/2020 FINAL  Final  Blood culture (routine x 2)     Status: None   Collection Time: 12/24/20  4:39 PM   Specimen: BLOOD  Result Value Ref Range Status   Specimen Description BLOOD BLOOD LEFT HAND  Final   Special Requests   Final    BOTTLES DRAWN AEROBIC ONLY Blood Culture adequate volume   Culture   Final    NO GROWTH 5 DAYS Performed at [facility], [address], [place], [place] [address]    Report Status 12/29/2020 FINAL  Final  Resp Panel by RT-PCR (Flu A&B, Covid) Nasopharyngeal Swab     Status: None   Collection Time: 12/24/20 10:18 PM  Specimen: Nasopharyngeal Swab; Nasopharyngeal(NP) swabs in vial transport medium  Result Value Ref Range Status   SARS Coronavirus 2 by RT PCR NEGATIVE NEGATIVE Final    Comment: (NOTE) SARS-CoV-2 target nucleic acids are NOT DETECTED.  The SARS-CoV-2 RNA is generally detectable in upper respiratory specimens during the acute phase of infection. The lowest concentration of SARS-CoV-2 viral copies this assay can detect is 138 copies/mL. A negative result does not preclude SARS-Cov-2 infection and should not be used as the sole basis for treatment or other patient management decisions. A negative result may occur with  improper specimen collection/handling, submission of specimen other than nasopharyngeal swab, presence of viral mutation(s) within the areas targeted by this assay, and inadequate number of viral copies(<138 copies/mL). A negative result must be combined with clinical observations, patient history, and epidemiological information. The expected result is Negative.  Fact Sheet for Patients:  BloggerCourse.com  Fact Sheet for Healthcare Providers:  SeriousBroker.it  This test is no t yet approved or cleared by the [place] FDA and  has been authorized for detection and/or diagnosis of SARS-CoV-2 by FDA under an  Emergency Use Authorization (EUA). This EUA will remain  in effect (meaning this test can be used) for the duration of the COVID-19 declaration under Section 564(b)(1) of the Act, 21 U.S.C.section 360bbb-3(b)(1), unless the authorization is terminated  or revoked sooner.       Influenza A by PCR NEGATIVE NEGATIVE Final   Influenza B by PCR NEGATIVE NEGATIVE Final    Comment: (NOTE) The Xpert Xpress SARS-CoV-2/FLU/RSV plus assay is intended as an aid in the diagnosis of influenza from Nasopharyngeal swab specimens and should not be used as a sole basis for treatment. Nasal washings and aspirates are unacceptable for Xpert Xpress SARS-CoV-2/FLU/RSV testing.  Fact Sheet for Patients: BloggerCourse.com  Fact Sheet for Healthcare Providers: SeriousBroker.it  This test is not yet approved or cleared by the [place] FDA and has been authorized for detection and/or diagnosis of SARS-CoV-2 by FDA under an Emergency Use Authorization (EUA). This EUA will remain in effect (meaning this test can be used) for the duration of the COVID-19 declaration under Section 564(b)(1) of the Act, 21 U.S.C. section 360bbb-3(b)(1), unless the authorization is terminated or revoked.  Performed at [facility], [address] [place], [place] [address]   MRSA PCR Screening     Status: Abnormal   Collection Time: 12/24/20 11:30 PM   Specimen: Nasopharyngeal  Result Value Ref Range Status   MRSA by PCR POSITIVE (A) NEGATIVE Final    Comment:        The GeneXpert MRSA Assay (FDA approved for NASAL specimens only), is one component of a comprehensive MRSA colonization surveillance program. It is not intended to diagnose MRSA infection nor to guide or monitor treatment for MRSA infections. RESULT CALLED TO, READ BACK BY AND VERIFIED WITH: KRISTINE SAUL AT 0136 12/25/20 MF. Performed at [facility], [address]  [place], [place] [address]   Culture, blood (x 2)     Status: None (Preliminary result)   Collection Time: 12/25/20  1:07 AM   Specimen: BLOOD  Result Value Ref Range Status   Specimen Description BLOOD LINE  Final   Special Requests   Final    BOTTLES DRAWN AEROBIC AND ANAEROBIC Blood Culture results may not be optimal due to an excessive volume of blood received in culture bottles   Culture   Final    NO GROWTH 5 DAYS Performed at [facility], [address] [place], [place] [address]    Report Status PENDING  Incomplete  Culture, blood (x 2)     Status: None   Collection Time: 12/25/20  1:24 AM   Specimen: BLOOD  Result Value Ref Range Status   Specimen Description BLOOD LEFT FOREARM  Final   Special Requests   Final    AEROBIC BOTTLE ONLY Blood Culture results may not be optimal due to an inadequate volume of blood received in culture bottles   Culture   Final    NO GROWTH 5 DAYS Performed at [facility], [address] [place] [address] [place], [place] [address]    Report Status 12/30/2020 FINAL  Final     Labs: BNP (last 3 results) Recent Labs    12/24/20 1540  BNP 3,981.5*   Basic Metabolic Panel: Recent Labs  Lab 12/24/20 1540 12/25/20 0124 12/25/20 0813 12/25/20 1019 12/26/20 0457 12/27/20 0501 12/28/20 0424  NA 137 144 138 139 140 142 146*  K 4.9 5.3* 5.0 4.9 4.2 3.8 4.2  CL 105 103 100 100 101 103 108  CO2 17* 23 24 24 26 24 23   GLUCOSE 208* 134* 124* 152* 140* 141* 162*  BUN 74* 71* 78* 83* 89* 97* 110*  CREATININE 3.91* 3.83* 3.96* 4.08* 3.96* 3.69* 4.08*  CALCIUM 9.0 7.9* 8.2* 8.1* 7.9* 8.0* 8.2*  MG 2.4 2.1  --   --  2.3 2.3 2.5*  PHOS  --  6.2*  --   --  5.9* 6.4* 6.4*   Liver Function Tests: Recent Labs  Lab 12/24/20 1540 12/25/20 0124 12/26/20 0457 12/27/20 0501 12/28/20 0424  AST 411* 355* 183* 93* 76*  ALT 95* 136* 103* 73* 73*  ALKPHOS 92 93 78 70 81  BILITOT 0.8 0.6 0.8 0.9 0.8  PROT 7.2 5.2* 5.8* 6.1* 6.1*  ALBUMIN 3.6 2.5*  2.7* 2.5* 2.6*   Recent Labs  Lab 12/24/20 1929  LIPASE 23   Recent Labs  Lab 12/25/20 0220  AMMONIA 11   CBC: Recent Labs  Lab 12/24/20 1540 12/24/20 2155 12/25/20 1757 12/26/20 0228 12/26/20 0457 12/26/20 1648 12/26/20 2248 12/27/20 0501 12/28/20 0424  WBC 26.0*   < > 17.0*  --  16.4* 15.1*  --  13.5* 12.0*  NEUTROABS 22.9*  --   --   --   --   --   --   --   --   HGB 11.4*   < > 9.7*   < > 10.1* 10.0* 10.0* 9.6* 10.5*  HCT 35.3*   < > 29.1*   < > 29.6* 29.9* 30.7* 28.5* 31.6*  MCV 99.2   < > 95.4  --  94.3 95.5  --  96.3 96.9  PLT 227   < > 198  --  187 178  --  172 185   < > = values in this interval not displayed.   Cardiac Enzymes: No results for input(s): CKTOTAL, CKMB, CKMBINDEX, TROPONINI in the last 168 hours. BNP: Invalid input(s): POCBNP CBG: Recent Labs  Lab 12/27/20 2318 12/28/20 0410 12/28/20 0725 12/28/20 1123 12/28/20 1556  GLUCAP 170* 157* 168* 147* 192*   D-Dimer No results for input(s): DDIMER in the last 72 hours. Hgb A1c No results for input(s): HGBA1C in the last 72 hours. Lipid Profile No results for input(s): CHOL, HDL, LDLCALC, TRIG, CHOLHDL, LDLDIRECT in the last 72 hours. Thyroid function studies No results for input(s): TSH, T4TOTAL, T3FREE, THYROIDAB in the last 72 hours.  Invalid  input(s): FREET3 Anemia work up No results for input(s): VITAMINB12, FOLATE, FERRITIN, TIBC, IRON, RETICCTPCT in the last 72 hours. Urinalysis    Component Value Date/Time   COLORURINE YELLOW (A) 12/24/2020 1516   APPEARANCEUR TURBID (A) 12/24/2020 1516   APPEARANCEUR Hazy 07/31/2012 1116   LABSPEC 1.020 12/24/2020 1516   LABSPEC 1.018 07/31/2012 1116   PHURINE 5.0 12/24/2020 1516   GLUCOSEU NEGATIVE 12/24/2020 1516   GLUCOSEU Negative 07/31/2012 1116   HGBUR NEGATIVE 12/24/2020 1516   BILIRUBINUR NEGATIVE 12/24/2020 1516   BILIRUBINUR Negative 07/31/2012 1116   KETONESUR 5 (A) 12/24/2020 1516   PROTEINUR 100 (A) 12/24/2020 1516    NITRITE NEGATIVE 12/24/2020 1516   LEUKOCYTESUR MODERATE (A) 12/24/2020 1516   LEUKOCYTESUR Trace 07/31/2012 1116   Sepsis Labs Invalid input(s): PROCALCITONIN,  WBC,  LACTICIDVEN Microbiology Recent Results (from the past 240 hour(s))  SARS CORONAVIRUS 2 (TAT 6-24 HRS) Nasopharyngeal Nasopharyngeal Swab     Status: None   Collection Time: 12/24/20  3:16 PM   Specimen: Nasopharyngeal Swab  Result Value Ref Range Status   SARS Coronavirus 2 NEGATIVE NEGATIVE Final    Comment: (NOTE) SARS-CoV-2 target nucleic acids are NOT DETECTED.  The SARS-CoV-2 RNA is generally detectable in upper and lower respiratory specimens during the acute phase of infection. Negative results do not preclude SARS-CoV-2 infection, do not rule out co-infections with other pathogens, and should not be used as the sole basis for treatment or other patient management decisions. Negative results must be combined with clinical observations, patient history, and epidemiological information. The expected result is Negative.  Fact Sheet for Patients: HairSlick.no  Fact Sheet for Healthcare Providers: quierodirigir.com  This test is not yet approved or cleared by the [place] FDA and  has been authorized for detection and/or diagnosis of SARS-CoV-2 by FDA under an Emergency Use Authorization (EUA). This EUA will remain  in effect (meaning this test can be used) for the duration of the COVID-19 declaration under Se ction 564(b)(1) of the Act, 21 U.S.C. section 360bbb-3(b)(1), unless the authorization is terminated or revoked sooner.  Performed at [facility], [address], [place], [place] [address]   Culture, Urine     Status: Abnormal   Collection Time: 12/24/20  3:16 PM   Specimen: Urine, Random  Result Value Ref Range Status   Specimen Description   Final    URINE, RANDOM Performed at [facility], [address]  [place], [place] [address]    Special Requests   Final    NONE Performed at [facility], [address] [place], [place] [address]    Culture >=100,000 COLONIES/mL ESCHERICHIA COLI (A)  Final   Report Status 12/27/2020 FINAL  Final   Organism ID, Bacteria ESCHERICHIA COLI (A)  Final      Susceptibility   Escherichia coli - MIC*    AMPICILLIN >=32 RESISTANT Resistant     CEFAZOLIN <=4 SENSITIVE Sensitive     CEFEPIME <=0.12 SENSITIVE Sensitive     CEFTRIAXONE <=0.25 SENSITIVE Sensitive     CIPROFLOXACIN <=0.25 SENSITIVE Sensitive     GENTAMICIN <=1 SENSITIVE Sensitive     IMIPENEM <=0.25 SENSITIVE Sensitive     NITROFURANTOIN <=16 SENSITIVE Sensitive     TRIMETH/SULFA >=320 RESISTANT Resistant     AMPICILLIN/SULBACTAM 16 INTERMEDIATE Intermediate     PIP/TAZO <=4 SENSITIVE Sensitive     * >=100,000 COLONIES/mL ESCHERICHIA COLI  Blood culture (routine x 2)     Status: None   Collection  Time: 12/24/20  4:39 PM   Specimen: Right Antecubital; Blood  Result Value Ref Range Status   Specimen Description RIGHT ANTECUBITAL  Final   Special Requests   Final    BOTTLES DRAWN AEROBIC AND ANAEROBIC Blood Culture adequate volume   Culture   Final    NO GROWTH 5 DAYS Performed at [facility], [address] [place], [place] [address]    Report Status 12/29/2020 FINAL  Final  Blood culture (routine x 2)     Status: None   Collection Time: 12/24/20  4:39 PM   Specimen: BLOOD  Result Value Ref Range Status   Specimen Description BLOOD BLOOD LEFT HAND  Final   Special Requests   Final    BOTTLES DRAWN AEROBIC ONLY Blood Culture adequate volume   Culture   Final    NO GROWTH 5 DAYS Performed at [facility], [address] [place], [place] [address]    Report Status 12/29/2020 FINAL  Final  Resp Panel by RT-PCR (Flu A&B, Covid) Nasopharyngeal Swab     Status: None   Collection Time: 12/24/20 10:18 PM   Specimen: Nasopharyngeal Swab; Nasopharyngeal(NP) swabs  in vial transport medium  Result Value Ref Range Status   SARS Coronavirus 2 by RT PCR NEGATIVE NEGATIVE Final    Comment: (NOTE) SARS-CoV-2 target nucleic acids are NOT DETECTED.  The SARS-CoV-2 RNA is generally detectable in upper respiratory specimens during the acute phase of infection. The lowest concentration of SARS-CoV-2 viral copies this assay can detect is 138 copies/mL. A negative result does not preclude SARS-Cov-2 infection and should not be used as the sole basis for treatment or other patient management decisions. A negative result may occur with  improper specimen collection/handling, submission of specimen other than nasopharyngeal swab, presence of viral mutation(s) within the areas targeted by this assay, and inadequate number of viral copies(<138 copies/mL). A negative result must be combined with clinical observations, patient history, and epidemiological information. The expected result is Negative.  Fact Sheet for Patients:  BloggerCourse.com  Fact Sheet for Healthcare Providers:  SeriousBroker.it  This test is no t yet approved or cleared by the [place] FDA and  has been authorized for detection and/or diagnosis of SARS-CoV-2 by FDA under an Emergency Use Authorization (EUA). This EUA will remain  in effect (meaning this test can be used) for the duration of the COVID-19 declaration under Section 564(b)(1) of the Act, 21 U.S.C.section 360bbb-3(b)(1), unless the authorization is terminated  or revoked sooner.       Influenza A by PCR NEGATIVE NEGATIVE Final   Influenza B by PCR NEGATIVE NEGATIVE Final    Comment: (NOTE) The Xpert Xpress SARS-CoV-2/FLU/RSV plus assay is intended as an aid in the diagnosis of influenza from Nasopharyngeal swab specimens and should not be used as a sole basis for treatment. Nasal washings and aspirates are unacceptable for Xpert Xpress  SARS-CoV-2/FLU/RSV testing.  Fact Sheet for Patients: BloggerCourse.com  Fact Sheet for Healthcare Providers: SeriousBroker.it  This test is not yet approved or cleared by the [place] FDA and has been authorized for detection and/or diagnosis of SARS-CoV-2 by FDA under an Emergency Use Authorization (EUA). This EUA will remain in effect (meaning this test can be used) for the duration of the COVID-19 declaration under Section 564(b)(1) of the Act, 21 U.S.C. section 360bbb-3(b)(1), unless the authorization is terminated or revoked.  Performed at [facility], [address], [place], [place] [address]   MRSA PCR Screening  Status: Abnormal   Collection Time: 12/24/20 11:30 PM   Specimen: Nasopharyngeal  Result Value Ref Range Status   MRSA by PCR POSITIVE (A) NEGATIVE Final    Comment:        The GeneXpert MRSA Assay (FDA approved for NASAL specimens only), is one component of a comprehensive MRSA colonization surveillance program. It is not intended to diagnose MRSA infection nor to guide or monitor treatment for MRSA infections. RESULT CALLED TO, READ BACK BY AND VERIFIED WITH: KRISTINE SAUL AT 0136 12/25/20 MF. Performed at [facility], [address] [place], [place] [address]   Culture, blood (x 2)     Status: None (Preliminary result)   Collection Time: 12/25/20  1:07 AM   Specimen: BLOOD  Result Value Ref Range Status   Specimen Description BLOOD LINE  Final   Special Requests   Final    BOTTLES DRAWN AEROBIC AND ANAEROBIC Blood Culture results may not be optimal due to an excessive volume of blood received in culture bottles   Culture   Final    NO GROWTH 5 DAYS Performed at [facility], [address], [place], [place] [address]    Report Status PENDING  Incomplete  Culture, blood (x 2)     Status: None   Collection Time: 12/25/20  1:24 AM   Specimen: BLOOD  Result  Value Ref Range Status   Specimen Description BLOOD LEFT FOREARM  Final   Special Requests   Final    AEROBIC BOTTLE ONLY Blood Culture results may not be optimal due to an inadequate volume of blood received in culture bottles   Culture   Final    NO GROWTH 5 DAYS Performed at [facility], [address], [place], [place] [address]    Report Status 12/30/2020 FINAL  Final     Time coordinating discharge:  25 minutes  SIGNED:   Huey Bienenstock, MD  [facility] 12/30/2020, 10:29 AM Pager   If 7PM-7AM, please contact night-coverage [email or web address] Password [email or web address]

## 2020-12-30 NOTE — Discharge Instructions (Signed)
Management per hospice 

## 2020-12-30 NOTE — Progress Notes (Signed)
[facility] Room 116 [facility]) Hospital Liaison RN note:  Hospice Home is able to offer a room today. DSS will be docusigning consents and transport has been arranged for 12 noon. I have faxed the discharge summary to Hospice Home. Hospital care team is aware.  Please call with any hospice related questions or concerns.  Thank you for opportunity to participate in this patient's care.  Cyndra Numbers, RN [facility] Liaison  [phone]

## 2020-12-30 NOTE — TOC Transition Note (Signed)
Transition of Care [facility]) - CM/SW Discharge Note   Patient Details  Name: Joanna Stone MRN: [id number] Date of Birth: 23-Sep-1940  Transition of Care [facility]) CM/SW Contact:  Allayne Butcher, RN Phone Number: 12/30/2020, 10:43 AM   Clinical Narrative:    [facility] home in [place] has a bed available.  Guardian is signing consents at 27.  RNCM has arranged transport to facility with [facility] for 12 noon today.     Final next level of care: Hospice Medical Facility Barriers to Discharge: Barriers Resolved   Patient Goals and CMS Choice Patient states their goals for this hospitalization and ongoing recovery are:: DSS guardian has decided on comfort measures- they do not want the patient to suffer CMS Medicare.gov Compare Post Acute Care list provided to:: Legal Guardian Choice offered to / list presented to : [facility] POA / Guardian  Discharge Placement              Patient chooses bed at: Other - please specify in the comment section below: [facility]) Patient to be transferred to facility by: [facility] Name of family member notified: Theodis Shove DSS Guardian Patient and family notified of of transfer: 12/30/20  Discharge Plan and Services In-house Referral: Hospice / Palliative Care Discharge Planning Services: CM Consult Post Acute Care Choice: Hospice          DME Arranged: N/A DME Agency: NA       HH Arranged: NA          Social Determinants of Health (SDOH) Interventions     Readmission Risk Interventions No flowsheet data found.

## 2021-01-01 LAB — CULTURE, BLOOD (ROUTINE X 2): Culture: NO GROWTH

## 2021-01-09 DEATH — deceased

## 2021-03-14 IMAGING — US VENOUS DOPPLER ULTRASOUND OF LEFT LOWER EXTREMITY
1 series · 13 of 24 positions shown · non-contrast
Comparison: None.

CLINICAL DATA: 78-year-old female with swelling and redness



[Series 1: venous doppler ultrasound of left lower extremity · 13 of 34 slices shown]
[im 1/34]
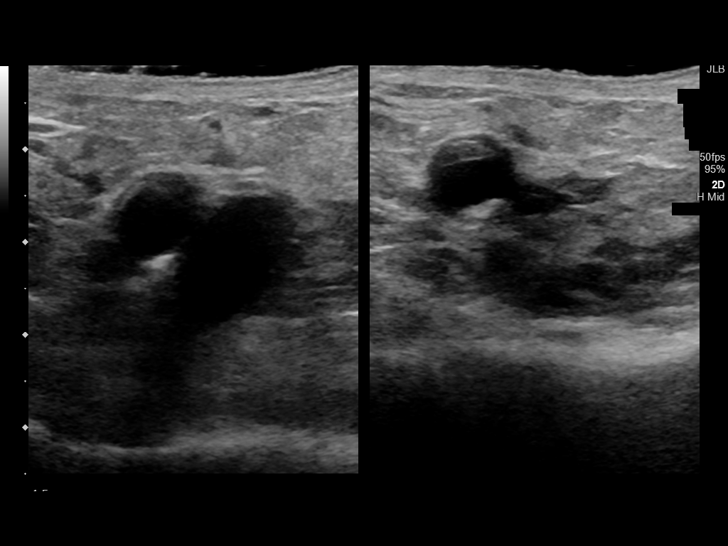
[im 3/34]
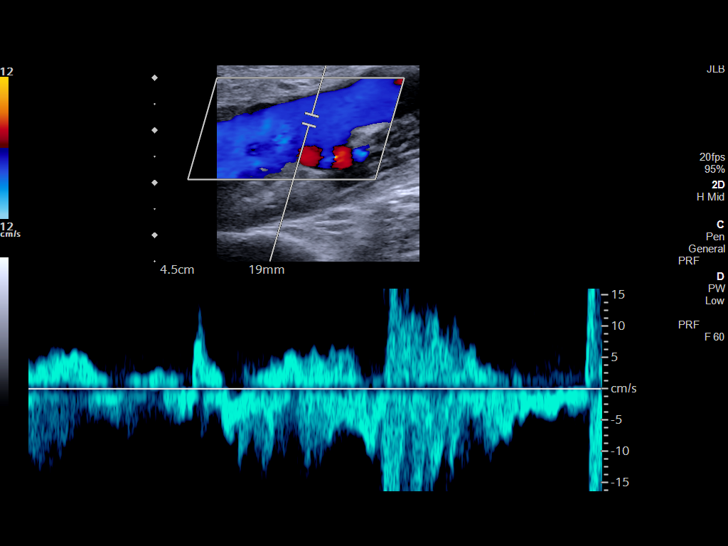
[im 6/34]
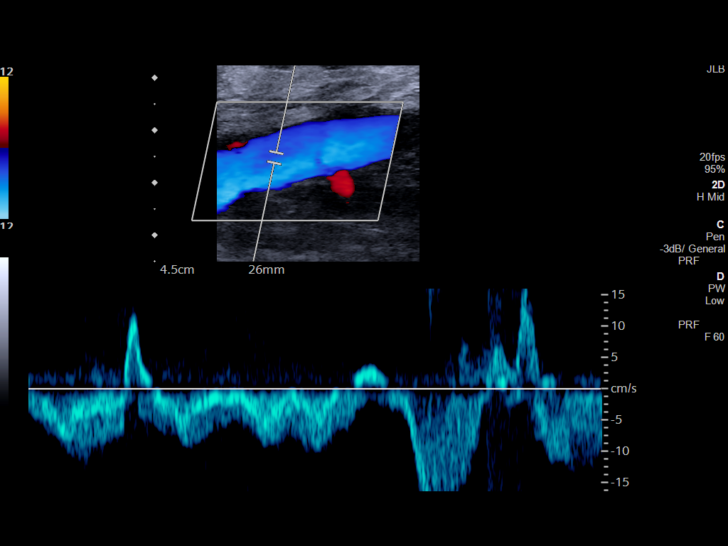
[im 9/34]
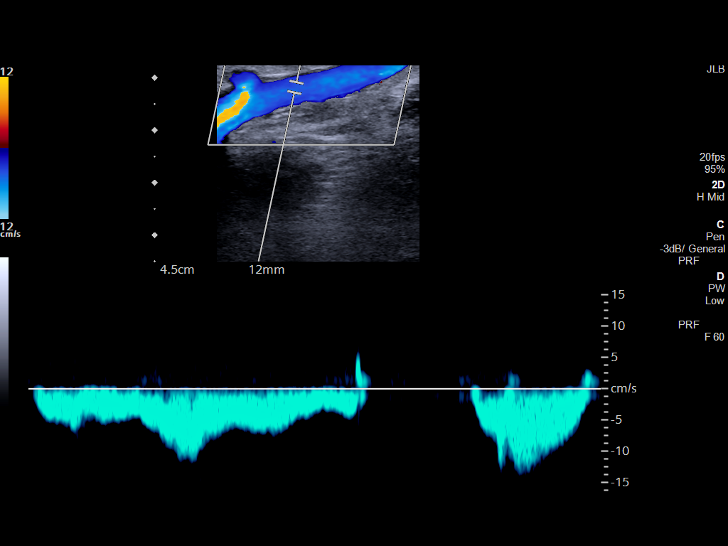
[im 12/34]
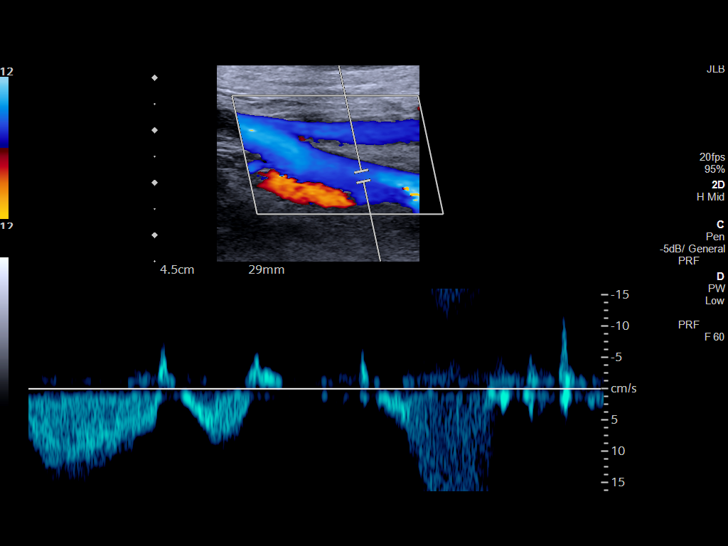
[im 15/34]
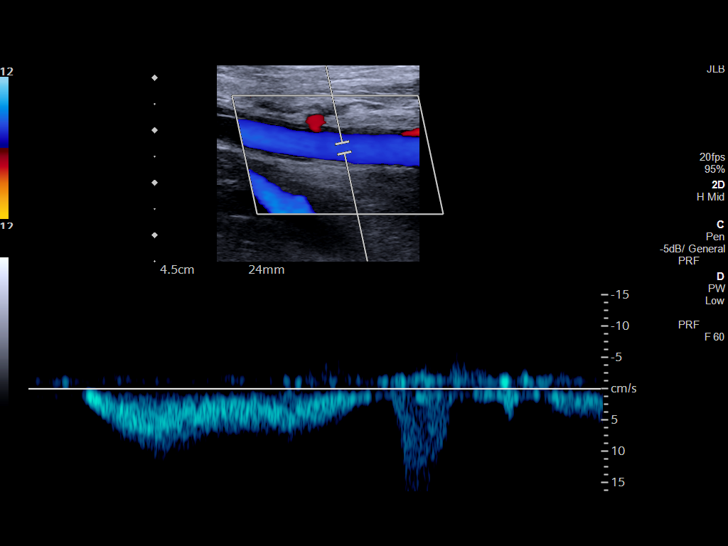
[im 18/34]
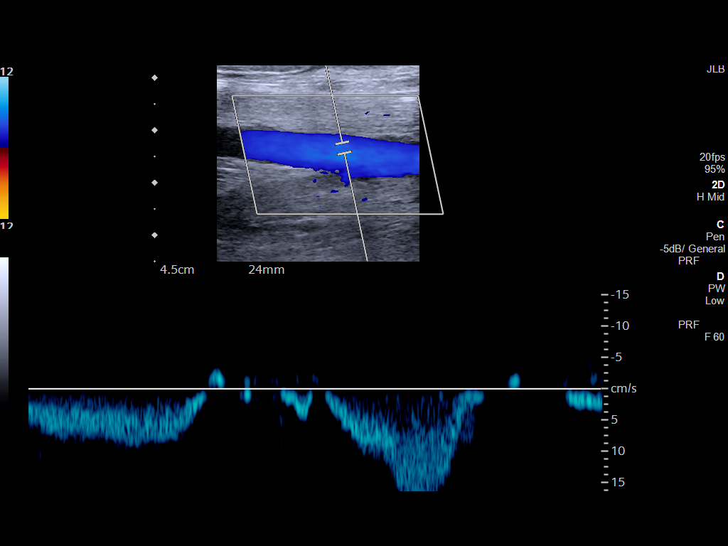
[im 19/34]
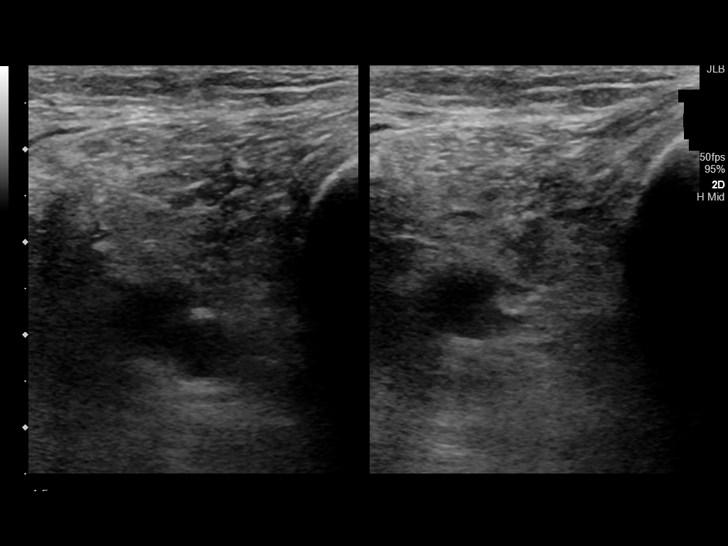
[im 22/34]
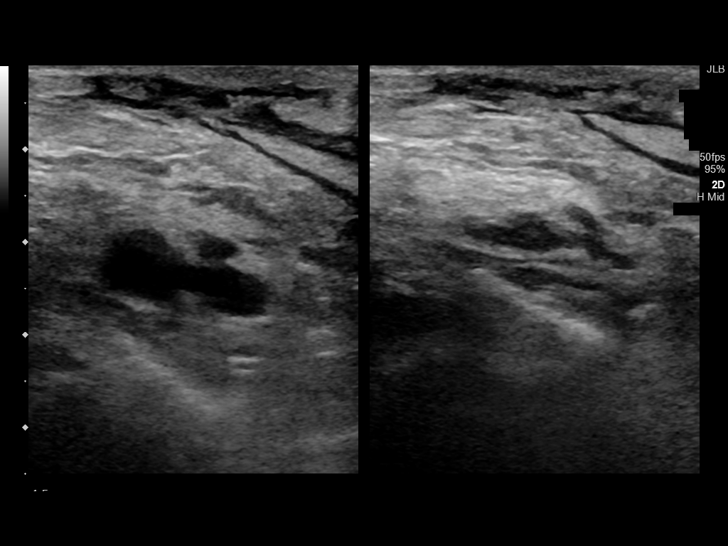
[im 25/34]
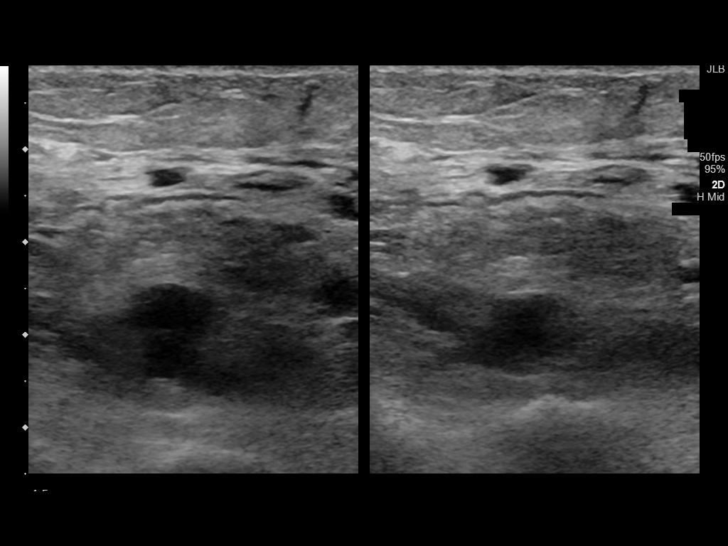
[im 28/34]
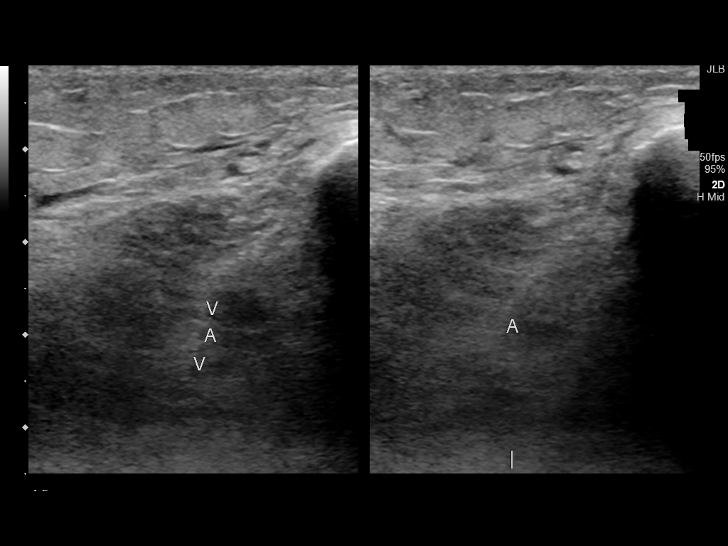
[im 31/34]
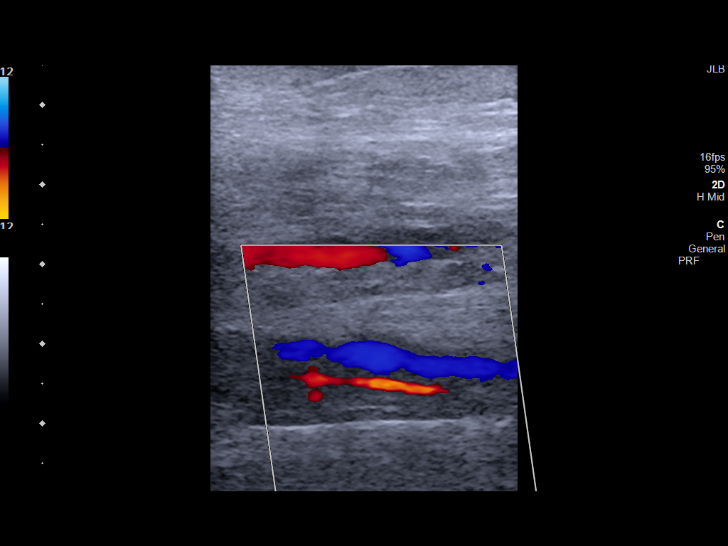
[im 34/34]
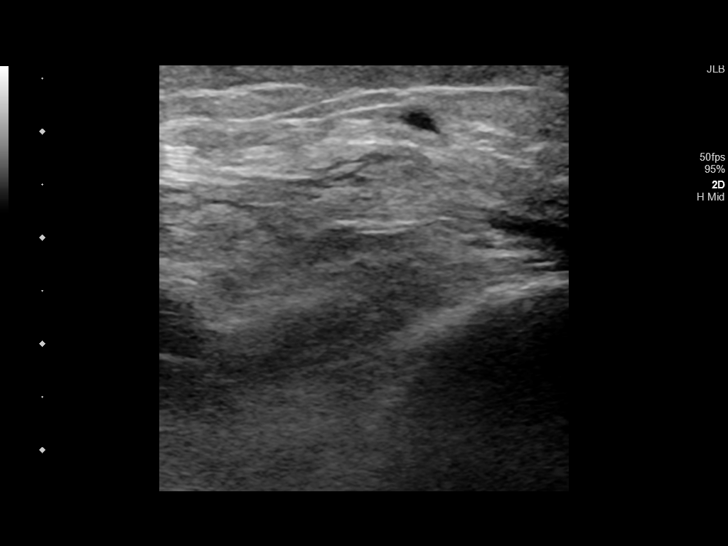

[13 of 24 positions shown; findings below may reference images not displayed]

FINDINGS: Contralateral Common Femoral Vein: Respiratory phasicity is normal
and symmetric with the symptomatic side. No evidence of thrombus.
Normal compressibility.

Common Femoral Vein: No evidence of thrombus. Normal
compressibility, respiratory phasicity and response to augmentation.

Saphenofemoral Junction: No evidence of thrombus. Normal
compressibility and flow on color Doppler imaging.

Profunda Femoral Vein: No evidence of thrombus. Normal
compressibility and flow on color Doppler imaging.

Femoral Vein: No evidence of thrombus. Normal compressibility,
respiratory phasicity and response to augmentation.

Popliteal Vein: No evidence of thrombus. Normal compressibility,
respiratory phasicity and response to augmentation.

Calf Veins: No thrombus of the posterior tibial vein. Peroneal vein
not visualized

Superficial Great Saphenous Vein: No evidence of thrombus. Normal
compressibility and flow on color Doppler imaging.

Other Findings:  Edema the left lower extremity
IMPRESSION: Sonographic survey of the left lower extremity negative for DVT.

Left lower extremity edema.

## 2023-01-04 IMAGING — CT CT HEAD W/O CM
4 series · 16 of 47 positions shown, 18 images · non-contrast
Comparison: 11/25/2019

CLINICAL DATA: Mental status change, slurred speech, lethargy,
dementia

EXAM:
CT HEAD WITHOUT CONTRAST
TECHNIQUE: Contiguous axial images were obtained from the base of the skull
through the vertex without intravenous contrast.

[Series 2: head wo · axial · 0.42mm/px · z∈[-96,+14]mm · 7 of 30 slices shown, 9 images]
[im 4/30  brain]
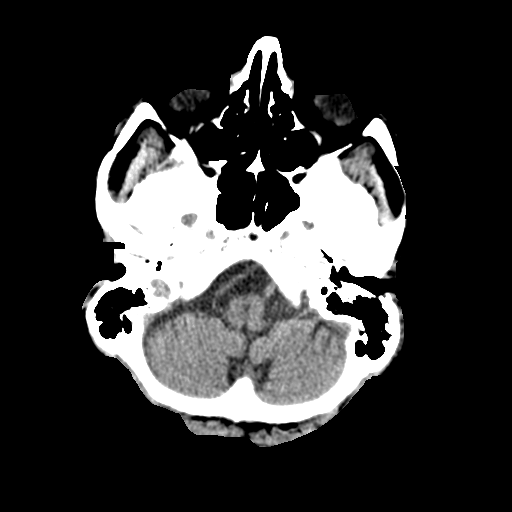
[im 4/30  bone]
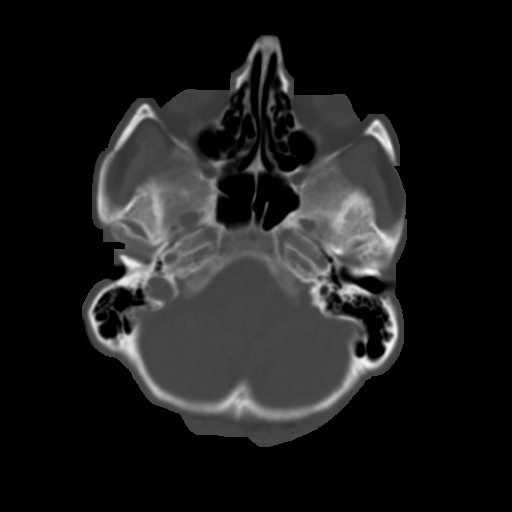
[im 8/30  brain]
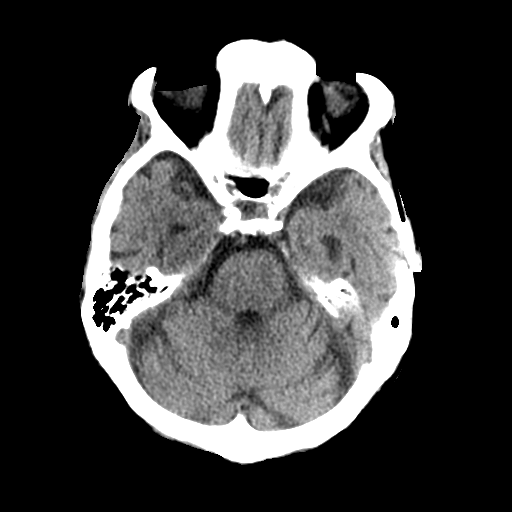
[im 11/30  brain]
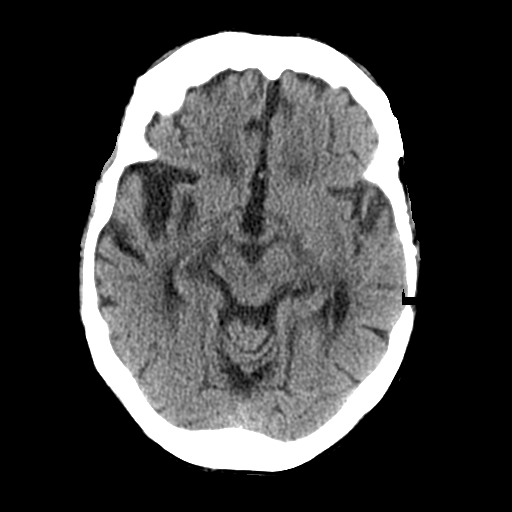
[im 15/30  brain]
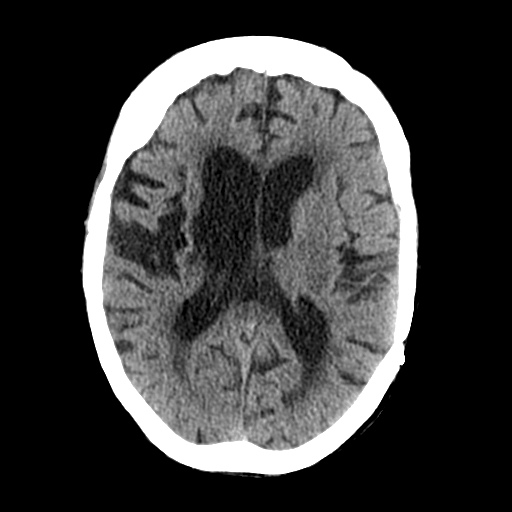
[im 19/30  brain]
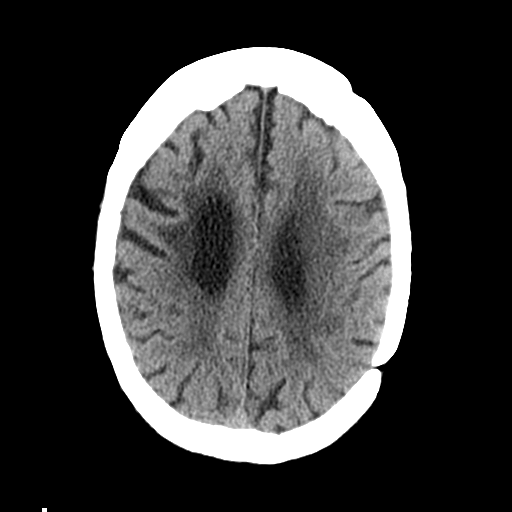
[im 19/30  bone]
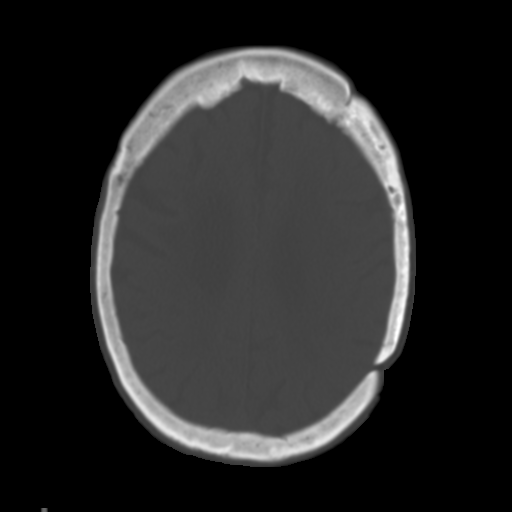
[im 22/30  brain]
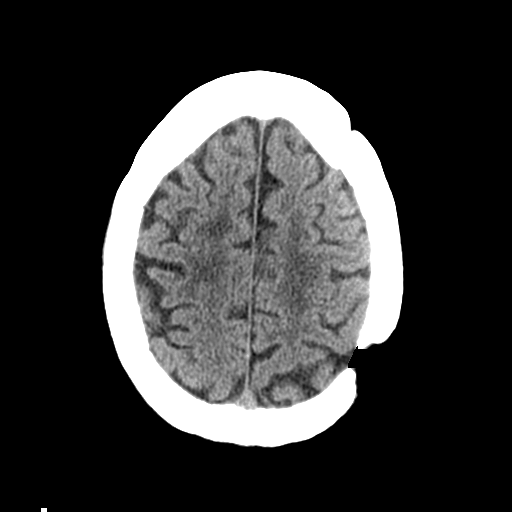
[im 26/30  brain]
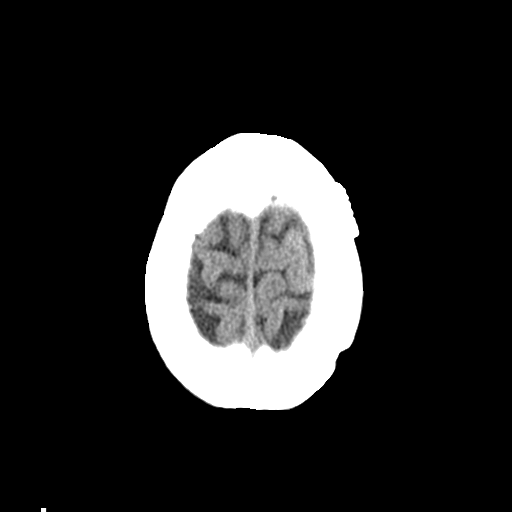

[Series 3: head bone · axial · 0.42mm/px · z∈[-97,-69]mm · 3 of 74 slices shown]
[im 8/74  bone]
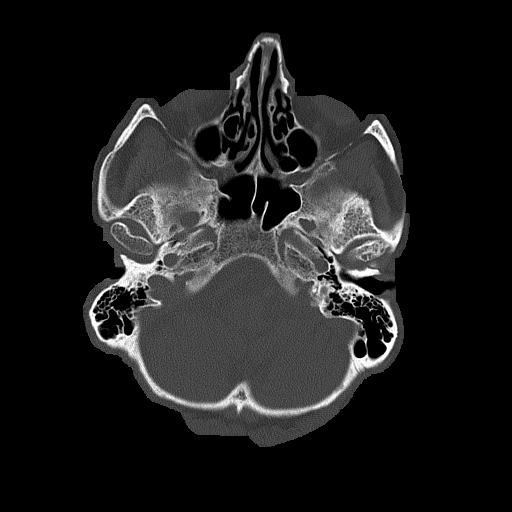
[im 15/74  bone]
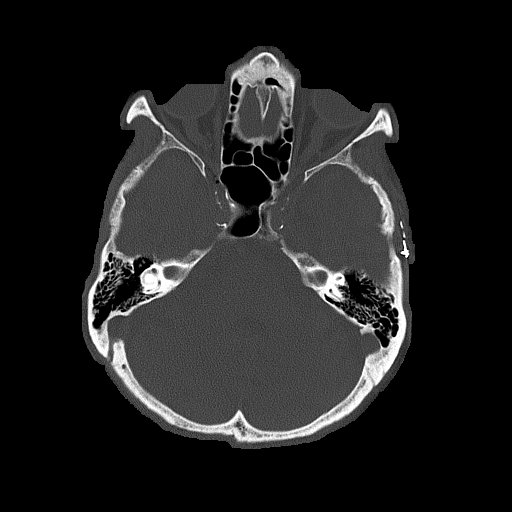
[im 22/74  bone]
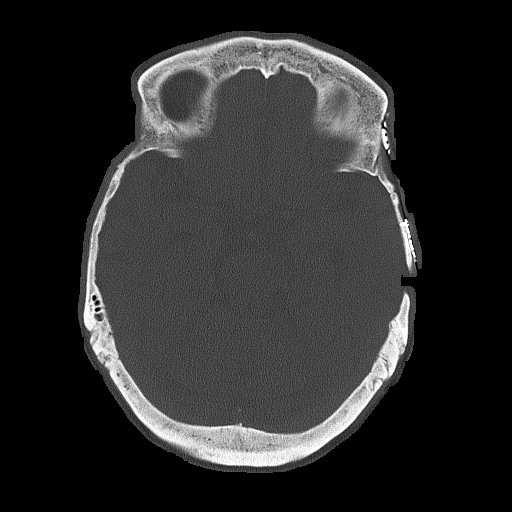

[Series 4: coronal soft tissue · coronal · 0.30mm/px · 3 of 64 slices shown]
[im 22/64  brain]
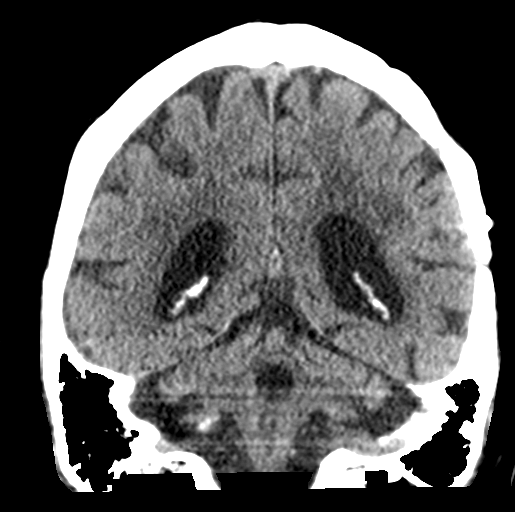
[im 29/64  brain]
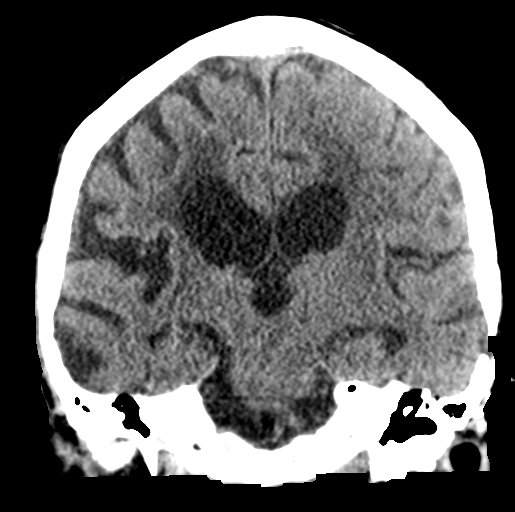
[im 36/64  brain]
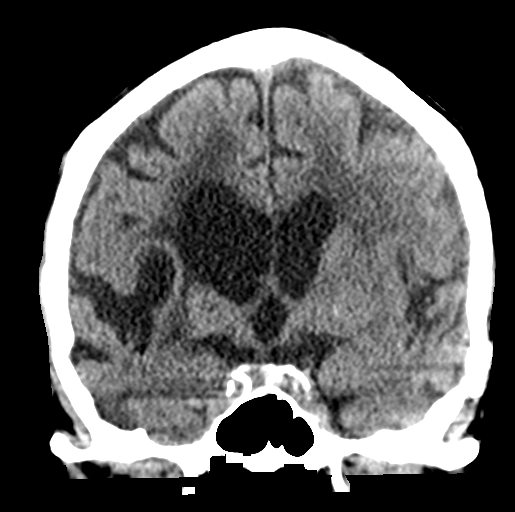

[Series 5: sagittal soft tissue · sagittal · 0.32mm/px · 3 of 51 slices shown]
[im 17/51  brain]
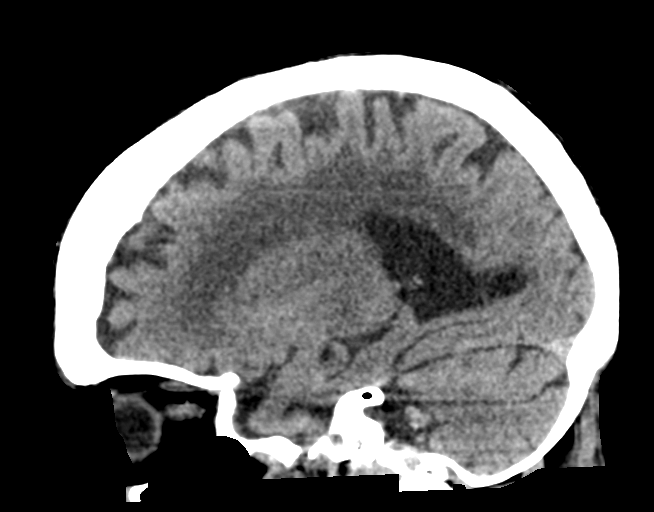
[im 26/51  brain]
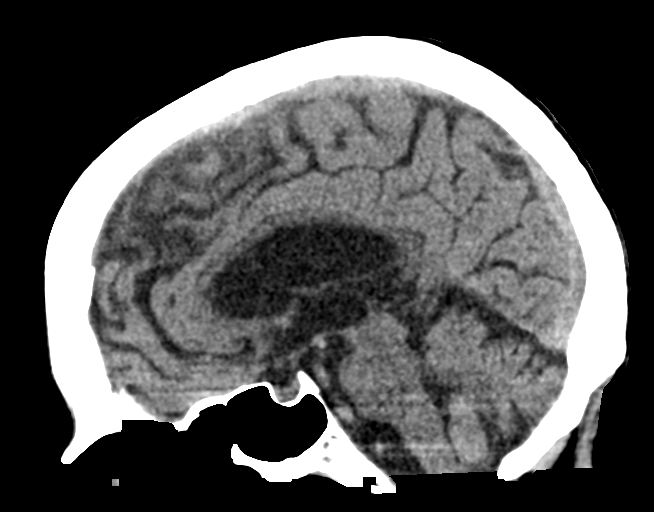
[im 34/51  brain]
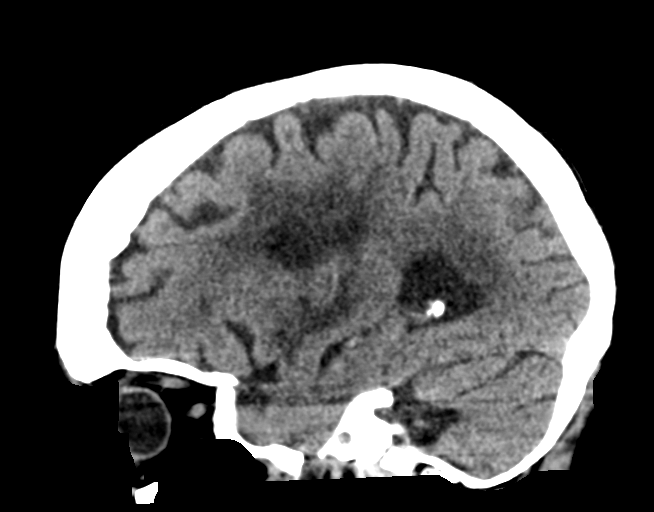

[16 of 47 positions shown; findings below may reference images not displayed]

FINDINGS: Brain: Stable atrophy and chronic white matter microvascular
ischemic changes throughout both cerebral hemispheres. Remote
bilateral basal ganglia lacunar type infarcts with ex vacuo
dilatation of the right lateral ventricle, unchanged. Stable mild
ventricular enlargement.

No acute intracranial hemorrhage, new mass lesion, definite acute
infarction, midline shift, or extra-axial fluid collection. No focal
mass effect or edema. Cisterns are patent. Cerebellar atrophy as
well.

Vascular: Intracranial atherosclerosis at the skull base. No
hyperdense vessel.

Skull: Remote left craniotomy.  Mastoids are clear.

Sinuses/Orbits: No acute finding.

Other: None.
IMPRESSION: Stable atrophy, chronic white matter microvascular ischemic changes,
and remote basal ganglia lacunar type infarcts.

No interval change or acute intracranial abnormality by noncontrast
CT.
